# Patient Record
Sex: Female | Born: 1965 | Race: White | Hispanic: No | Marital: Single | Smoking: Current every day smoker
Health system: Southern US, Community
[De-identification: ages and names within clinical notes are randomized; demographics above are authoritative.]

## PROBLEM LIST (undated history)

## (undated) DIAGNOSIS — N760 Acute vaginitis: Secondary | ICD-10-CM

## (undated) DIAGNOSIS — N281 Cyst of kidney, acquired: Secondary | ICD-10-CM

## (undated) DIAGNOSIS — M545 Low back pain, unspecified: Secondary | ICD-10-CM

## (undated) DIAGNOSIS — K219 Gastro-esophageal reflux disease without esophagitis: Secondary | ICD-10-CM

## (undated) DIAGNOSIS — F419 Anxiety disorder, unspecified: Secondary | ICD-10-CM

## (undated) DIAGNOSIS — R0981 Nasal congestion: Secondary | ICD-10-CM

## (undated) DIAGNOSIS — R1030 Lower abdominal pain, unspecified: Secondary | ICD-10-CM

## (undated) DIAGNOSIS — N200 Calculus of kidney: Secondary | ICD-10-CM

## (undated) DIAGNOSIS — B373 Candidiasis of vulva and vagina: Secondary | ICD-10-CM

## (undated) DIAGNOSIS — F32A Depression, unspecified: Secondary | ICD-10-CM

## (undated) DIAGNOSIS — K648 Other hemorrhoids: Secondary | ICD-10-CM

## (undated) DIAGNOSIS — B3731 Acute candidiasis of vulva and vagina: Secondary | ICD-10-CM

## (undated) DIAGNOSIS — Z9889 Other specified postprocedural states: Secondary | ICD-10-CM

## (undated) DIAGNOSIS — IMO0001 Reserved for inherently not codable concepts without codable children: Secondary | ICD-10-CM

## (undated) DIAGNOSIS — R112 Nausea with vomiting, unspecified: Secondary | ICD-10-CM

## (undated) DIAGNOSIS — F329 Major depressive disorder, single episode, unspecified: Secondary | ICD-10-CM

## (undated) DIAGNOSIS — R310 Gross hematuria: Secondary | ICD-10-CM

## (undated) HISTORY — PX: NASAL SINUS SURGERY: SHX719

## (undated) HISTORY — DX: Other hemorrhoids: K64.8

## (undated) HISTORY — DX: Gross hematuria: R31.0

## (undated) HISTORY — DX: Acute candidiasis of vulva and vagina: B37.31

## (undated) HISTORY — DX: Candidiasis of vulva and vagina: B37.3

## (undated) HISTORY — DX: Calculus of kidney: N20.0

## (undated) HISTORY — PX: TUBAL LIGATION: SHX77

## (undated) HISTORY — DX: Nasal congestion: R09.81

## (undated) HISTORY — DX: Cyst of kidney, acquired: N28.1

## (undated) HISTORY — DX: Major depressive disorder, single episode, unspecified: F32.9

## (undated) HISTORY — DX: Acute vaginitis: N76.0

## (undated) HISTORY — DX: Gastro-esophageal reflux disease without esophagitis: K21.9

## (undated) HISTORY — DX: Depression, unspecified: F32.A

## (undated) HISTORY — DX: Low back pain: M54.5

## (undated) HISTORY — DX: Low back pain, unspecified: M54.50

## (undated) HISTORY — PX: ABDOMINAL HYSTERECTOMY: SHX81

## (undated) HISTORY — DX: Lower abdominal pain, unspecified: R10.30

## (undated) HISTORY — DX: Anxiety disorder, unspecified: F41.9

---

## 2003-10-17 ENCOUNTER — Ambulatory Visit: Payer: Self-pay | Admitting: Obstetrics and Gynecology

## 2005-09-02 ENCOUNTER — Ambulatory Visit: Payer: Self-pay | Admitting: Infectious Diseases

## 2005-11-23 ENCOUNTER — Ambulatory Visit: Payer: Self-pay | Admitting: Gastroenterology

## 2005-11-29 ENCOUNTER — Ambulatory Visit: Payer: Self-pay | Admitting: Gastroenterology

## 2006-11-08 ENCOUNTER — Ambulatory Visit: Payer: Self-pay | Admitting: Gastroenterology

## 2006-12-26 ENCOUNTER — Ambulatory Visit: Payer: Self-pay | Admitting: Unknown Physician Specialty

## 2008-01-29 ENCOUNTER — Ambulatory Visit: Payer: Self-pay | Admitting: Unknown Physician Specialty

## 2008-02-08 ENCOUNTER — Ambulatory Visit: Payer: Self-pay | Admitting: Unknown Physician Specialty

## 2009-10-05 ENCOUNTER — Encounter (INDEPENDENT_AMBULATORY_CARE_PROVIDER_SITE_OTHER): Payer: Self-pay | Admitting: *Deleted

## 2010-02-03 NOTE — Assessment & Plan Note (Signed)
Summary: SINUS INFECTION/EVM    Plan Planning Comments:   Patient left due to insurance issues.   The patient and/or caregiver has been counseled thoroughly with regard to medications prescribed including dosage, schedule, interactions, rationale for use, and possible side effects and they verbalize understanding.  Diagnoses and expected course of recovery discussed and will return if not improved as expected or if the condition worsens. Patient and/or caregiver verbalized understanding.

## 2012-01-22 ENCOUNTER — Ambulatory Visit: Payer: Self-pay | Admitting: Unknown Physician Specialty

## 2012-01-26 LAB — EXPECTORATED SPUTUM ASSESSMENT W GRAM STAIN, RFLX TO RESP C

## 2014-02-20 DIAGNOSIS — F17210 Nicotine dependence, cigarettes, uncomplicated: Secondary | ICD-10-CM | POA: Insufficient documentation

## 2014-03-14 ENCOUNTER — Emergency Department: Payer: Self-pay | Admitting: Emergency Medicine

## 2014-09-25 DIAGNOSIS — F32A Depression, unspecified: Secondary | ICD-10-CM | POA: Insufficient documentation

## 2014-09-25 DIAGNOSIS — F419 Anxiety disorder, unspecified: Secondary | ICD-10-CM | POA: Insufficient documentation

## 2014-09-25 DIAGNOSIS — M5440 Lumbago with sciatica, unspecified side: Secondary | ICD-10-CM | POA: Insufficient documentation

## 2014-09-25 DIAGNOSIS — B373 Candidiasis of vulva and vagina: Secondary | ICD-10-CM | POA: Insufficient documentation

## 2014-09-25 DIAGNOSIS — F329 Major depressive disorder, single episode, unspecified: Secondary | ICD-10-CM | POA: Insufficient documentation

## 2014-09-25 DIAGNOSIS — B3731 Acute candidiasis of vulva and vagina: Secondary | ICD-10-CM | POA: Insufficient documentation

## 2014-09-25 DIAGNOSIS — K648 Other hemorrhoids: Secondary | ICD-10-CM | POA: Insufficient documentation

## 2014-10-01 DIAGNOSIS — N760 Acute vaginitis: Secondary | ICD-10-CM | POA: Insufficient documentation

## 2014-10-25 ENCOUNTER — Other Ambulatory Visit: Payer: Self-pay | Admitting: Internal Medicine

## 2014-10-25 DIAGNOSIS — R310 Gross hematuria: Secondary | ICD-10-CM | POA: Insufficient documentation

## 2014-10-27 DIAGNOSIS — R319 Hematuria, unspecified: Secondary | ICD-10-CM | POA: Insufficient documentation

## 2014-10-31 ENCOUNTER — Ambulatory Visit
Admission: RE | Admit: 2014-10-31 | Discharge: 2014-10-31 | Disposition: A | Discharge status: Home / Self Care | Payer: Managed Care, Other (non HMO) | Source: Ambulatory Visit | Attending: Internal Medicine | Admitting: Internal Medicine

## 2014-10-31 DIAGNOSIS — N2889 Other specified disorders of kidney and ureter: Secondary | ICD-10-CM | POA: Insufficient documentation

## 2014-10-31 DIAGNOSIS — R319 Hematuria, unspecified: Secondary | ICD-10-CM | POA: Diagnosis not present

## 2014-10-31 DIAGNOSIS — R310 Gross hematuria: Secondary | ICD-10-CM

## 2014-11-05 ENCOUNTER — Ambulatory Visit (INDEPENDENT_AMBULATORY_CARE_PROVIDER_SITE_OTHER): Payer: Managed Care, Other (non HMO) | Admitting: Urology

## 2014-11-05 ENCOUNTER — Encounter: Payer: Self-pay | Admitting: Urology

## 2014-11-05 ENCOUNTER — Encounter: Payer: Self-pay | Admitting: *Deleted

## 2014-11-05 VITALS — BP 109/70 | HR 78 | Temp 98.0°F | Resp 16 | Ht 68.0 in | Wt 191.0 lb

## 2014-11-05 DIAGNOSIS — R310 Gross hematuria: Secondary | ICD-10-CM | POA: Diagnosis not present

## 2014-11-05 LAB — MICROSCOPIC EXAMINATION
RBC, UA: >30 /hpf — AB (ref 0–?)
Renal Epithel, UA: NONE SEEN /hpf
WBC, UA: NONE SEEN /hpf (ref 0–?)

## 2014-11-05 LAB — URINALYSIS, COMPLETE
Bilirubin, UA: NEGATIVE
Glucose, UA: NEGATIVE
Ketones, UA: NEGATIVE
LEUKOCYTES UA: NEGATIVE
Nitrite, UA: NEGATIVE
PROTEIN UA: NEGATIVE
Specific Gravity, UA: >1.03 — ABNORMAL HIGH (ref 1.005–1.030)
Urobilinogen, Ur: 0.2 mg/dL (ref 0.2–1.0)
pH, UA: 6 (ref 5.0–7.5)

## 2014-11-05 NOTE — Progress Notes (Signed)
11/05/2014 11:19 AM   Brianna Huff 1965/08/10 [id number]  Referring provider: No referring provider defined for this encounter.  Chief Complaint  Patient presents with  . Hematuria  . Establish Care    HPI: Patient is a 49 year old white female with an episode of gross hematuria who is referred by her primary care physician for further evaluation and management.  Patient states she was on Augmentin for bronchitis and had an episode of gross hematuria during her antibiotic course. She has no other urinary symptoms at this time. She specifically denies any dysuria, suprapubic pain, history of stones, history of infections or personal history of GU malignancies.    She does have a 34-pack-year history of smoking and works as a Retail buyer.  She is having some left sided flank pain.  There is no family history of GU malignancies or stones.       PMH: Past Medical History  Diagnosis Date  . GERD (gastroesophageal reflux disease)   . Anxiety and depression   . Gross hematuria   . Vaginitis   . Monilial vaginitis   . Low back pain   . Internal hemorrhoids   . Nasal congestion     Surgical History: Past Surgical History  Procedure Laterality Date  . Nasal sinus surgery    . Tubal ligation    . Abdominal hysterectomy      still has ovaries     Home Medications:    Medication List       This list is accurate as of: 11/05/14 11:59 PM.  Always use your most recent med list.               citalopram 10 MG tablet  Commonly known as:  CELEXA  Take 10 mg by mouth daily.     meloxicam 7.5 MG tablet  Commonly known as:  MOBIC  Take 7.5 mg by mouth daily.     omeprazole 20 MG capsule  Commonly known as:  PRILOSEC  Take 20 mg by mouth daily.     PROBIOTIC DAILY PO  Take by mouth.        Allergies:  Allergies  Allergen Reactions  . 2,4-D Dimethylamine (Amisol) Shortness Of Breath  . Sulfa Antibiotics Itching and Shortness Of Breath  . Penicillin G    Other reaction(s): Unknown    Family History: Family History  Problem Relation Age of Onset  . Prostate cancer Neg Hx   . Kidney cancer Neg Hx     Social History:  reports that she has been smoking Cigarettes.  She has a 35 pack-year smoking history. She does not have any smokeless tobacco history on file. She reports that she does not drink alcohol or use illicit drugs.  ROS: UROLOGY Frequent Urination?: No Hard to postpone urination?: No Burning/pain with urination?: No Get up at night to urinate?: No Leakage of urine?: No Urine stream starts and stops?: No Trouble starting stream?: No Do you have to strain to urinate?: No Blood in urine?: Yes Urinary tract infection?: No Sexually transmitted disease?: No Injury to kidneys or bladder?: No Painful intercourse?: No Weak stream?: No Currently pregnant?: No Vaginal bleeding?: No Last menstrual period?: n  Gastrointestinal Nausea?: No Vomiting?: No Indigestion/heartburn?: Yes Diarrhea?: No Constipation?: No  Constitutional Fever: No Night sweats?: No Weight loss?: No Fatigue?: No  Skin Skin rash/lesions?: No Itching?: No  Eyes Blurred vision?: No Double vision?: No  Ears/Nose/Throat Sore throat?: No Sinus problems?: Yes  Hematologic/Lymphatic Swollen glands?: No  Easy bruising?: Yes  Cardiovascular Leg swelling?: No Chest pain?: No  Respiratory Cough?: Yes Shortness of breath?: No  Endocrine Excessive thirst?: No  Musculoskeletal Back pain?: Yes Joint pain?: No  Neurological Headaches?: No Dizziness?: No  Psychologic Depression?: No Anxiety?: No  Physical Exam: BP 109/70 mmHg  Pulse 78  Temp(Src) 98 F (36.7 C)  Resp 16  Ht 5\' 8"  (1.727 m)  Wt 191 lb (86.637 kg)  BMI 29.05 kg/m2  SpO2 98%  Constitutional: Well nourished. Alert and oriented, No acute distress. HEENT: [place] AT, moist mucus membranes. Trachea midline, no masses. Cardiovascular: No clubbing, cyanosis, or  edema. Respiratory: Normal respiratory effort, no increased work of breathing. GI: Abdomen is soft, non tender, non distended, no abdominal masses. Liver and spleen not palpable.  No hernias appreciated.  Stool sample for occult testing is not indicated.   GU: No CVA tenderness.  No bladder fullness or masses.   Skin: No rashes, bruises or suspicious lesions. Lymph: No cervical or inguinal adenopathy. Neurologic: Grossly intact, no focal deficits, moving all 4 extremities. Psychiatric: Normal mood and affect.  Laboratory Data:  Urinalysis Results for orders placed or performed in visit on 11/05/14  Microscopic Examination  Result Value Ref Range   WBC, UA None seen 0 -  5 /hpf   RBC, UA >30 (A) 0 -  2 /hpf   Epithelial Cells (non renal) 0-10 0 - 10 /hpf   Renal Epithel, UA None seen None seen /hpf   Bacteria, UA Few (A) None seen/Few  Urinalysis, Complete  Result Value Ref Range   Specific Gravity, UA >1.030 (H) 1.005 - 1.030   pH, UA 6.0 5.0 - 7.5   Color, UA Yellow Yellow   Appearance Ur Clear Clear   Leukocytes, UA Negative Negative   Protein, UA Negative Negative/Trace   Glucose, UA Negative Negative   Ketones, UA Negative Negative   RBC, UA 3+ (A) Negative   Bilirubin, UA Negative Negative   Urobilinogen, Ur 0.2 0.2 - 1.0 mg/dL   Nitrite, UA Negative Negative   Microscopic Examination See below:     1. Gross hematuria:   Explained to patient the causes of blood in the urine are as follows: stones, UTI's, damage to the urinary tract and/or cancer.  It is explained to the patient that they will be scheduled for a CT Urogram with contrast material and that in rare instances, an allergic reaction can be serious and even life threatening with the injection of contrast material.   The patient denies any allergies to contrast, iodine and/or seafood and is not taking metformin.  - Urinalysis, Complete   Return for CT Urogram.  Zara Council, [place] [address] [place], [place] [address] [phone]

## 2014-11-06 ENCOUNTER — Encounter: Payer: Self-pay | Admitting: Urology

## 2014-11-06 ENCOUNTER — Telehealth: Payer: Self-pay | Admitting: Radiology

## 2014-11-06 NOTE — Telephone Encounter (Signed)
Received request to have pt's CT Abdomen Pelvis WWO order sent to [facility] # [phone]. Per Zara Council, when pt goes to another location she is unable to see the films. Our equipment doesn't allow [place] to view CDs from another facility. LMOM for pt to return call.

## 2014-11-07 NOTE — Telephone Encounter (Signed)
Pt instructed to call insurance company to have location of CT scan changed to [facility] so films could be viewed. Pt did this & CT was scheduled at the outpatient imaging center on [address].

## 2014-11-11 ENCOUNTER — Emergency Department
Admission: EM | Admit: 2014-11-11 | Discharge: 2014-11-11 | Disposition: A | Discharge status: Home / Self Care | Payer: Managed Care, Other (non HMO) | Attending: Emergency Medicine | Admitting: Emergency Medicine

## 2014-11-11 ENCOUNTER — Emergency Department: Payer: Managed Care, Other (non HMO)

## 2014-11-11 DIAGNOSIS — N23 Unspecified renal colic: Secondary | ICD-10-CM | POA: Insufficient documentation

## 2014-11-11 DIAGNOSIS — Z880 Allergy status to penicillin: Secondary | ICD-10-CM | POA: Diagnosis not present

## 2014-11-11 DIAGNOSIS — Z79899 Other long term (current) drug therapy: Secondary | ICD-10-CM | POA: Insufficient documentation

## 2014-11-11 DIAGNOSIS — Z791 Long term (current) use of non-steroidal anti-inflammatories (NSAID): Secondary | ICD-10-CM | POA: Insufficient documentation

## 2014-11-11 DIAGNOSIS — N200 Calculus of kidney: Secondary | ICD-10-CM | POA: Insufficient documentation

## 2014-11-11 DIAGNOSIS — R109 Unspecified abdominal pain: Secondary | ICD-10-CM | POA: Diagnosis present

## 2014-11-11 LAB — URINALYSIS COMPLETE WITH MICROSCOPIC (ARMC ONLY)
BILIRUBIN URINE: NEGATIVE
GLUCOSE, UA: NEGATIVE mg/dL
KETONES UR: NEGATIVE mg/dL
Leukocytes, UA: NEGATIVE
NITRITE: NEGATIVE
Protein, ur: 30 mg/dL — AB
SPECIFIC GRAVITY, URINE: 1.019 (ref 1.005–1.030)
pH: 8 (ref 5.0–8.0)

## 2014-11-11 LAB — BASIC METABOLIC PANEL
ANION GAP: 14 (ref 5–15)
BUN: 18 mg/dL (ref 6–20)
CALCIUM: 9.5 mg/dL (ref 8.9–10.3)
CO2: 22 mmol/L (ref 22–32)
Chloride: 103 mmol/L (ref 101–111)
Creatinine, Ser: 0.75 mg/dL (ref 0.44–1.00)
GFR calc Af Amer: >60 mL/min (ref >60)
GLUCOSE: 142 mg/dL — AB (ref 65–99)
Potassium: 3.4 mmol/L — ABNORMAL LOW (ref 3.5–5.1)
Sodium: 139 mmol/L (ref 135–145)

## 2014-11-11 LAB — CBC
HCT: 43 % (ref 35.0–47.0)
HEMOGLOBIN: 14.3 g/dL (ref 12.0–16.0)
MCH: 30.6 pg (ref 26.0–34.0)
MCHC: 33.3 g/dL (ref 32.0–36.0)
MCV: 91.8 fL (ref 80.0–100.0)
PLATELETS: 320 10*3/uL (ref 150–440)
RBC: 4.69 MIL/uL (ref 3.80–5.20)
RDW: 14.7 % — AB (ref 11.5–14.5)
WBC: 11.5 10*3/uL — ABNORMAL HIGH (ref 3.6–11.0)

## 2014-11-11 MED ORDER — OXYCODONE-ACETAMINOPHEN 5-325 MG PO TABS
2.0000 | ORAL_TABLET | Freq: Once | ORAL | Status: AC
Start: 2014-11-11 — End: 2014-11-11
  Administered 2014-11-11: 2 via ORAL
  Filled 2014-11-11: qty 2

## 2014-11-11 MED ORDER — ONDANSETRON 4 MG PO TBDP: 4.0000 mg | ORAL_TABLET | Freq: Three times a day (TID) | ORAL | Status: DC | PRN

## 2014-11-11 MED ORDER — KETOROLAC TROMETHAMINE 30 MG/ML IJ SOLN
30.0000 mg | Freq: Once | INTRAMUSCULAR | Status: AC
  Administered 2014-11-11: 30 mg via INTRAVENOUS
  Filled 2014-11-11: qty 1

## 2014-11-11 MED ORDER — OXYCODONE-ACETAMINOPHEN 5-325 MG PO TABS: 1.0000 | ORAL_TABLET | ORAL | Status: DC | PRN

## 2014-11-11 MED ORDER — ONDANSETRON HCL 4 MG/2ML IJ SOLN
4.0000 mg | Freq: Once | INTRAMUSCULAR | Status: AC
  Administered 2014-11-11: 4 mg via INTRAVENOUS

## 2014-11-11 MED ORDER — SODIUM CHLORIDE 0.9 % IV SOLN
1000.0000 mL | Freq: Once | INTRAVENOUS | Status: AC
  Administered 2014-11-11: 1000 mL via INTRAVENOUS
  Filled 2014-11-11: qty 1000

## 2014-11-11 MED ORDER — HYDROMORPHONE HCL 1 MG/ML IJ SOLN
1.0000 mg | INTRAMUSCULAR | Status: AC
  Administered 2014-11-11: 1 mg via INTRAVENOUS

## 2014-11-11 MED ORDER — ONDANSETRON HCL 4 MG/2ML IJ SOLN
INTRAMUSCULAR | Status: AC
  Administered 2014-11-11: 4 mg via INTRAVENOUS
  Filled 2014-11-11: qty 2

## 2014-11-11 MED ORDER — CIPROFLOXACIN HCL 500 MG PO TABS
500.0000 mg | ORAL_TABLET | Freq: Once | ORAL | Status: AC
  Administered 2014-11-11: 500 mg via ORAL
  Filled 2014-11-11: qty 1

## 2014-11-11 MED ORDER — METOCLOPRAMIDE HCL 5 MG/ML IJ SOLN
10.0000 mg | Freq: Once | INTRAMUSCULAR | Status: AC
  Administered 2014-11-11: 10 mg via INTRAVENOUS
  Filled 2014-11-11: qty 2

## 2014-11-11 MED ORDER — HYDROMORPHONE HCL 1 MG/ML IJ SOLN
INTRAMUSCULAR | Status: AC
  Administered 2014-11-11: 0.5 mg via INTRAVENOUS
  Filled 2014-11-11: qty 1

## 2014-11-11 MED ORDER — HYDROMORPHONE HCL 1 MG/ML IJ SOLN
0.5000 mg | INTRAMUSCULAR | Status: AC
  Administered 2014-11-11: 0.5 mg via INTRAVENOUS

## 2014-11-11 MED ORDER — CIPROFLOXACIN HCL 500 MG PO TABS
500.0000 mg | ORAL_TABLET | Freq: Two times a day (BID) | ORAL | Status: DC
Start: 2014-11-11 — End: 2014-12-25

## 2014-11-11 MED ORDER — HYDROMORPHONE HCL 1 MG/ML IJ SOLN
INTRAMUSCULAR | Status: AC
  Administered 2014-11-11: 1 mg via INTRAVENOUS
  Filled 2014-11-11: qty 1

## 2014-11-11 MED ORDER — KETOROLAC TROMETHAMINE 30 MG/ML IJ SOLN: 30.0000 mg | Freq: Once | INTRAMUSCULAR | Status: DC

## 2014-11-11 NOTE — Discharge Instructions (Signed)
You have a 4 mm x 6 mm stone in the left ureter, just at the end before into the bladder. You will likely pass this on your own. Take Percocet as needed for pain control. Follow-up with [facility] urology. Return to the emergency department if you have uncontrolled pain, ongoing nausea vomiting, or other urgent concerns.  Kidney Stones Kidney stones (urolithiasis) are deposits that form inside your kidneys. The intense pain is caused by the stone moving through the urinary tract. When the stone moves, the ureter goes into spasm around the stone. The stone is usually passed in the urine.  CAUSES   A disorder that makes certain neck glands produce too much parathyroid hormone (primary hyperparathyroidism).  A buildup of uric acid crystals, similar to gout in your joints.  Narrowing (stricture) of the ureter.  A kidney obstruction present at birth (congenital obstruction).  Previous surgery on the kidney or ureters.  Numerous kidney infections. SYMPTOMS   Feeling sick to your stomach (nauseous).  Throwing up (vomiting).  Blood in the urine (hematuria).  Pain that usually spreads (radiates) to the groin.  Frequency or urgency of urination. DIAGNOSIS   Taking a history and physical exam.  Blood or urine tests.  CT scan.  Occasionally, an examination of the inside of the urinary bladder (cystoscopy) is performed. TREATMENT   Observation.  Increasing your fluid intake.  Extracorporeal shock wave lithotripsy--This is a noninvasive procedure that uses shock waves to break up kidney stones.  Surgery may be needed if you have severe pain or persistent obstruction. There are various surgical procedures. Most of the procedures are performed with the use of small instruments. Only small incisions are needed to accommodate these instruments, so recovery time is minimized. The size, location, and chemical composition are all important variables that will determine the proper choice of  action for you. Talk to your health care provider to better understand your situation so that you will minimize the risk of injury to yourself and your kidney.  HOME CARE INSTRUCTIONS   Drink enough water and fluids to keep your urine clear or pale yellow. This will help you to pass the stone or stone fragments.  Strain all urine through the provided strainer. Keep all particulate matter and stones for your health care provider to see. The stone causing the pain may be as small as a grain of salt. It is very important to use the strainer each and every time you pass your urine. The collection of your stone will allow your health care provider to analyze it and verify that a stone has actually passed. The stone analysis will often identify what you can do to reduce the incidence of recurrences.  Only take over-the-counter or prescription medicines for pain, discomfort, or fever as directed by your health care provider.  Keep all follow-up visits as told by your health care provider. This is important.  Get follow-up X-rays if required. The absence of pain does not always mean that the stone has passed. It may have only stopped moving. If the urine remains completely obstructed, it can cause loss of kidney function or even complete destruction of the kidney. It is your responsibility to make sure X-rays and follow-ups are completed. Ultrasounds of the kidney can show blockages and the status of the kidney. Ultrasounds are not associated with any radiation and can be performed easily in a matter of minutes.  Make changes to your daily diet as told by your health care provider. You may be  told to:  Limit the amount of salt that you eat.  Eat 5 or more servings of fruits and vegetables each day.  Limit the amount of meat, poultry, fish, and eggs that you eat.  Collect a 24-hour urine sample as told by your health care provider.You may need to collect another urine sample every 6-12 months. SEEK  MEDICAL CARE IF:  You experience pain that is progressive and unresponsive to any pain medicine you have been prescribed. SEEK IMMEDIATE MEDICAL CARE IF:   Pain cannot be controlled with the prescribed medicine.  You have a fever or shaking chills.  The severity or intensity of pain increases over 18 hours and is not relieved by pain medicine.  You develop a new onset of abdominal pain.  You feel faint or pass out.  You are unable to urinate.   This information is not intended to replace advice given to you by your health care provider. Make sure you discuss any questions you have with your health care provider.   Document Released: 12/21/2004 Document Revised: 09/11/2014 Document Reviewed: 05/24/2012 Elsevier Interactive Patient Education [employer].

## 2014-11-11 NOTE — ED Provider Notes (Signed)
[facility] Emergency Department Provider Note  ____________________________________________  Time seen: 1950   I have reviewed the triage vital signs and the nursing notes.   HISTORY  Chief Complaint Flank Pain  nausea vomiting    HPI Brianna Huff is a 49 y.o. female presents to the emergency department with left flank pain and repetitive and forceful emesis. The patient is in moderate to severe distress.  The patient has had symptoms for a few days. She had an ultrasound the other day which did not show any stone or hydronephrosis. She has a CT scan ordered for tomorrow morning due to her ongoing symptoms.    Past Medical History  Diagnosis Date  . GERD (gastroesophageal reflux disease)   . Anxiety and depression   . Gross hematuria   . Vaginitis   . Monilial vaginitis   . Low back pain   . Internal hemorrhoids   . Nasal congestion   . Lower abdominal pain     Patient Active Problem List   Diagnosis Date Noted  . Gross hematuria 11/05/2014  . Blood in the urine 10/27/2014  . Frank hematuria 10/25/2014  . Acute vaginitis 10/01/2014  . Acute back pain with sciatica 09/25/2014  . Hemorrhoids, internal 09/25/2014  . Anxiety and depression 09/25/2014  . Candida vaginitis 09/25/2014  . Cigarette smoker 02/20/2014    Past Surgical History  Procedure Laterality Date  . Nasal sinus surgery    . Tubal ligation    . Abdominal hysterectomy      still has ovaries     Current Outpatient Rx  Name  Route  Sig  Dispense  Refill  . citalopram (CELEXA) 10 MG tablet   Oral   Take 10 mg by mouth daily.         . meloxicam (MOBIC) 7.5 MG tablet   Oral   Take 7.5 mg by mouth daily.         Marland Kitchen omeprazole (PRILOSEC) 40 MG capsule   Oral   Take 40 mg by mouth daily.         . Probiotic Product (PROBIOTIC DAILY PO)   Oral   Take by mouth.         . ondansetron (ZOFRAN ODT) 4 MG disintegrating tablet   Oral   Take 1 tablet (4 mg  total) by mouth every 8 (eight) hours as needed for nausea or vomiting.   10 tablet   0   . oxyCODONE-acetaminophen (PERCOCET/ROXICET) 5-325 MG tablet   Oral   Take 1 tablet by mouth every 4 (four) hours as needed for severe pain.   20 tablet   0     Allergies 2,4-d dimethylamine (amisol); Sulfa antibiotics; and Penicillin g  Family History  Problem Relation Age of Onset  . Prostate cancer Neg Hx   . Kidney cancer Neg Hx     Social History Social History  Substance Use Topics  . Smoking status: Current Every Day Smoker -- 1.00 packs/day for 35 years    Types: Cigarettes  . Smokeless tobacco: Not on file  . Alcohol Use: No    Review of Systems  Constitutional: Negative for fatigue. ENT: Negative for congestion. Cardiovascular: Negative for chest pain. Respiratory: Negative for cough. Gastrointestinal: Left flank pain. Nausea and vomiting. Genitourinary: Left flank pain. See history of present illness. Musculoskeletal: No myalgias or injuries. Skin: Negative for rash. Neurological: Negative for headache or focal weakness   10-point ROS otherwise negative.  ____________________________________________   PHYSICAL  EXAM:  VITAL SIGNS: ED Triage Vitals  Enc Vitals Group     BP 11/11/14 1926 117/93 mmHg     Pulse Rate 11/11/14 1926 76     Resp 11/11/14 1926 24     Temp 11/11/14 1926 97.9 F (36.6 C)     Temp Source 11/11/14 1926 Oral     SpO2 11/11/14 1926 100 %     Weight 11/11/14 1926 191 lb (86.637 kg)     Height 11/11/14 1926 5\' 8"  (1.727 m)     Head Cir --      Peak Flow --      Pain Score 11/11/14 1925 10     Pain Loc --      Pain Edu? --      Excl. in [place]? --     Constitutional:  Alert, but distressed with active emesis. ENT   Head: Normocephalic and atraumatic. Cardiovascular: Normal rate, regular rhythm, no murmur noted Respiratory:  Normal respiratory effort, no tachypnea.    Breath sounds are clear and equal bilaterally.   Gastrointestinal: Soft, with some tenderness in the left abdomen..  Back: Positive CVA tenderness in the left.. Musculoskeletal: No deformity noted. Nontender with normal range of motion in all extremities.  No noted edema. Neurologic:  Communicative. Normal appearing spontaneous movement in all 4 extremities. No gross focal neurologic deficits are appreciated.  Skin:  Skin is moist, patient with active emesis. Psychiatric: Patient in distress due to active and ongoing nausea and vomiting. Otherwise, appropriate with intact admission.  ____________________________________________    LABS (pertinent positives/negatives)  Labs Reviewed  BASIC METABOLIC PANEL - Abnormal; Notable for the following:    Potassium 3.4 (*)    Glucose, Bld 142 (*)    All other components within normal limits  CBC - Abnormal; Notable for the following:    WBC 11.5 (*)    RDW 14.7 (*)    All other components within normal limits  URINALYSIS COMPLETEWITH MICROSCOPIC ([facility] ONLY) - Abnormal; Notable for the following:    Color, Urine YELLOW (*)    APPearance CLOUDY (*)    Hgb urine dipstick 2+ (*)    Protein, ur 30 (*)    Bacteria, UA MANY (*)    Squamous Epithelial / LPF 6-30 (*)    All other components within normal limits     ____________________________________________   RADIOLOGY  CT abdomen and pelvis: IMPRESSION: 1. Obstructing 4 x 6 mm stone at the left ureterovesicular junction with moderate resultant hydroureteronephrosis and perinephric stranding. 2. Additional nonobstructing stones in both kidneys, left greater than right. 3. Diffuse diverticulosis without diverticulitis.   ____________________________________________   INITIAL IMPRESSION / ASSESSMENT AND PLAN / ED COURSE  Pertinent labs & imaging results that were available during my care of the patient were reviewed by me and considered in my medical decision making (see chart for details).  49 year old female in significant  distress on arrival. She has recurrent emesis and left flank pain. Urinalysis shows red blood cells too numerous to count. She's been treated with Dilaudid 1 mg and Zofran. Nausea and vomiting continued. We gave her 10 mg of Reglan and then a little more Dilaudid. After this she looks significantly more comfortable and had her CT scan.  The CT scan does show an obstructing stone at the left UVJ, 4 mm x 6 mm. She has notable hydronephrosis with perinephric stranding.  Given the distal location, I suspect this stone should pass on its own. We will treat her with  Toradol for additional control of pain and, due to the perinephric stranding, give her a dose of Cipro.  ----------------------------------------- 9:44 PM on 11/11/2014 -----------------------------------------  Patient looks significantly better then when she first arrived. She is much more calm and communicative in no acute distress. She does report that the pain is coming back some. We are treating her with a dose of Toradol. Given the distal location of the stone, I suspect she will pass this on her own.  We will also treated with a Percocet now as well.   ____________________________________________   FINAL CLINICAL IMPRESSION(S) / ED DIAGNOSES  Final diagnoses:  Renal colic on left side  Kidney stone      Ahmed Prima, MD 11/11/14 2319

## 2014-11-11 NOTE — ED Notes (Signed)
Pt to room 4 via w/c actively dry heaving, very restless, incont urine; reports left flank and lower abd pain with nausea and hematuria; denies hx of same

## 2014-11-11 NOTE — ED Notes (Signed)
Pt in NAD.  Pain decreased.  Pt understands discharge instructions.  Family with patient upon discharge.  Urine strainer provided to patient and instructed on how to use.  No other questions or concerns at this time.

## 2014-11-12 ENCOUNTER — Ambulatory Visit: Admission: RE | Admit: 2014-11-12 | Payer: Managed Care, Other (non HMO) | Source: Ambulatory Visit

## 2014-11-13 ENCOUNTER — Emergency Department
Admission: EM | Admit: 2014-11-13 | Discharge: 2014-11-13 | Disposition: A | Discharge status: Home / Self Care | Payer: Managed Care, Other (non HMO) | Attending: Emergency Medicine | Admitting: Emergency Medicine

## 2014-11-13 ENCOUNTER — Emergency Department: Payer: Managed Care, Other (non HMO) | Admitting: Anesthesiology

## 2014-11-13 ENCOUNTER — Encounter
Admission: EM | Disposition: A | Discharge status: Home / Self Care | Payer: Self-pay | Source: Home / Self Care | Attending: Emergency Medicine

## 2014-11-13 ENCOUNTER — Encounter: Payer: Self-pay | Admitting: *Deleted

## 2014-11-13 DIAGNOSIS — N200 Calculus of kidney: Secondary | ICD-10-CM

## 2014-11-13 DIAGNOSIS — M549 Dorsalgia, unspecified: Secondary | ICD-10-CM | POA: Diagnosis not present

## 2014-11-13 DIAGNOSIS — R1032 Left lower quadrant pain: Secondary | ICD-10-CM | POA: Diagnosis not present

## 2014-11-13 DIAGNOSIS — R310 Gross hematuria: Secondary | ICD-10-CM | POA: Insufficient documentation

## 2014-11-13 DIAGNOSIS — Z888 Allergy status to other drugs, medicaments and biological substances status: Secondary | ICD-10-CM | POA: Diagnosis not present

## 2014-11-13 DIAGNOSIS — Z79899 Other long term (current) drug therapy: Secondary | ICD-10-CM | POA: Diagnosis not present

## 2014-11-13 DIAGNOSIS — N132 Hydronephrosis with renal and ureteral calculous obstruction: Secondary | ICD-10-CM | POA: Insufficient documentation

## 2014-11-13 DIAGNOSIS — R109 Unspecified abdominal pain: Secondary | ICD-10-CM | POA: Diagnosis not present

## 2014-11-13 DIAGNOSIS — R112 Nausea with vomiting, unspecified: Secondary | ICD-10-CM | POA: Diagnosis not present

## 2014-11-13 DIAGNOSIS — K219 Gastro-esophageal reflux disease without esophagitis: Secondary | ICD-10-CM | POA: Diagnosis not present

## 2014-11-13 DIAGNOSIS — F419 Anxiety disorder, unspecified: Secondary | ICD-10-CM | POA: Insufficient documentation

## 2014-11-13 DIAGNOSIS — B373 Candidiasis of vulva and vagina: Secondary | ICD-10-CM | POA: Diagnosis not present

## 2014-11-13 DIAGNOSIS — N760 Acute vaginitis: Secondary | ICD-10-CM | POA: Insufficient documentation

## 2014-11-13 DIAGNOSIS — M5432 Sciatica, left side: Secondary | ICD-10-CM | POA: Insufficient documentation

## 2014-11-13 DIAGNOSIS — Z90710 Acquired absence of both cervix and uterus: Secondary | ICD-10-CM | POA: Diagnosis not present

## 2014-11-13 DIAGNOSIS — Z882 Allergy status to sulfonamides status: Secondary | ICD-10-CM | POA: Diagnosis not present

## 2014-11-13 DIAGNOSIS — F329 Major depressive disorder, single episode, unspecified: Secondary | ICD-10-CM | POA: Insufficient documentation

## 2014-11-13 DIAGNOSIS — Z880 Allergy status to penicillin: Secondary | ICD-10-CM | POA: Insufficient documentation

## 2014-11-13 DIAGNOSIS — F17210 Nicotine dependence, cigarettes, uncomplicated: Secondary | ICD-10-CM | POA: Diagnosis not present

## 2014-11-13 DIAGNOSIS — N201 Calculus of ureter: Secondary | ICD-10-CM | POA: Diagnosis not present

## 2014-11-13 HISTORY — PX: CYSTOSCOPY WITH STENT PLACEMENT: SHX5790

## 2014-11-13 LAB — URINALYSIS COMPLETE WITH MICROSCOPIC (ARMC ONLY)
BILIRUBIN URINE: NEGATIVE
Bacteria, UA: NONE SEEN
Glucose, UA: 50 mg/dL — AB
LEUKOCYTES UA: NEGATIVE
NITRITE: NEGATIVE
PH: 7 (ref 5.0–8.0)
Protein, ur: NEGATIVE mg/dL
Specific Gravity, Urine: 1.015 (ref 1.005–1.030)

## 2014-11-13 LAB — CBC
HCT: 39.5 % (ref 35.0–47.0)
Hemoglobin: 13.2 g/dL (ref 12.0–16.0)
MCH: 30.6 pg (ref 26.0–34.0)
MCHC: 33.3 g/dL (ref 32.0–36.0)
MCV: 91.8 fL (ref 80.0–100.0)
PLATELETS: 280 10*3/uL (ref 150–440)
RBC: 4.31 MIL/uL (ref 3.80–5.20)
RDW: 14.4 % (ref 11.5–14.5)
WBC: 11.8 10*3/uL — AB (ref 3.6–11.0)

## 2014-11-13 LAB — BASIC METABOLIC PANEL
Anion gap: 6 (ref 5–15)
BUN: 20 mg/dL (ref 6–20)
CALCIUM: 8.8 mg/dL — AB (ref 8.9–10.3)
CO2: 24 mmol/L (ref 22–32)
CREATININE: 0.96 mg/dL (ref 0.44–1.00)
Chloride: 106 mmol/L (ref 101–111)
Glucose, Bld: 137 mg/dL — ABNORMAL HIGH (ref 65–99)
Potassium: 3.5 mmol/L (ref 3.5–5.1)
SODIUM: 136 mmol/L (ref 135–145)

## 2014-11-13 SURGERY — CYSTOSCOPY, WITH STENT INSERTION
Anesthesia: General | Site: Ureter | Laterality: Left | Wound class: Clean Contaminated

## 2014-11-13 MED ORDER — SODIUM CHLORIDE 0.9 % IV SOLN
INTRAVENOUS | Status: DC | PRN
  Administered 2014-11-13: 06:00:00 via INTRAVENOUS

## 2014-11-13 MED ORDER — TAMSULOSIN HCL 0.4 MG PO CAPS: 0.4000 mg | ORAL_CAPSULE | Freq: Every day | ORAL | Status: DC

## 2014-11-13 MED ORDER — MORPHINE SULFATE (PF) 4 MG/ML IV SOLN
4.0000 mg | Freq: Once | INTRAVENOUS | Status: AC
  Administered 2014-11-13: 4 mg via INTRAVENOUS
  Filled 2014-11-13: qty 1

## 2014-11-13 MED ORDER — OXYCODONE HCL 5 MG PO TABS
5.0000 mg | ORAL_TABLET | Freq: Once | ORAL | Status: AC | PRN
  Administered 2014-11-13: 5 mg via ORAL

## 2014-11-13 MED ORDER — CEFAZOLIN SODIUM-DEXTROSE 2-3 GM-% IV SOLR
INTRAVENOUS | Status: DC | PRN
  Administered 2014-11-13: 2 g via INTRAVENOUS

## 2014-11-13 MED ORDER — SODIUM CHLORIDE 0.9 % IV BOLUS (SEPSIS)
1000.0000 mL | Freq: Once | INTRAVENOUS | Status: AC
  Administered 2014-11-13: 1000 mL via INTRAVENOUS

## 2014-11-13 MED ORDER — HYDROMORPHONE HCL 1 MG/ML IJ SOLN
INTRAMUSCULAR | Status: AC
  Administered 2014-11-13: 1 mg via INTRAVENOUS
  Filled 2014-11-13: qty 1

## 2014-11-13 MED ORDER — OXYCODONE-ACETAMINOPHEN 5-325 MG PO TABS: 1.0000 | ORAL_TABLET | ORAL | Status: DC | PRN

## 2014-11-13 MED ORDER — PROMETHAZINE HCL 25 MG/ML IJ SOLN
12.5000 mg | Freq: Once | INTRAMUSCULAR | Status: AC
  Administered 2014-11-13: 12.5 mg via INTRAVENOUS

## 2014-11-13 MED ORDER — FENTANYL CITRATE (PF) 100 MCG/2ML IJ SOLN
INTRAMUSCULAR | Status: DC | PRN
  Administered 2014-11-13 (×2): 25 ug via INTRAVENOUS

## 2014-11-13 MED ORDER — FENTANYL CITRATE (PF) 100 MCG/2ML IJ SOLN
INTRAMUSCULAR | Status: AC
  Administered 2014-11-13: 50 ug via INTRAVENOUS
  Filled 2014-11-13: qty 2

## 2014-11-13 MED ORDER — SODIUM CHLORIDE 0.9 % IR SOLN
Status: DC | PRN
  Administered 2014-11-13: 100 mL

## 2014-11-13 MED ORDER — IOTHALAMATE MEGLUMINE 43 % IV SOLN
INTRAVENOUS | Status: DC | PRN
  Administered 2014-11-13: 15 mL via URETHRAL

## 2014-11-13 MED ORDER — HYDROMORPHONE HCL 1 MG/ML IJ SOLN
1.0000 mg | Freq: Once | INTRAMUSCULAR | Status: AC
  Administered 2014-11-13: 1 mg via INTRAVENOUS

## 2014-11-13 MED ORDER — LIDOCAINE HCL (CARDIAC) 20 MG/ML IV SOLN
INTRAVENOUS | Status: DC | PRN
  Administered 2014-11-13: 100 mg via INTRAVENOUS

## 2014-11-13 MED ORDER — PROPOFOL 10 MG/ML IV BOLUS
INTRAVENOUS | Status: DC | PRN
  Administered 2014-11-13: 180 mg via INTRAVENOUS

## 2014-11-13 MED ORDER — FENTANYL CITRATE (PF) 100 MCG/2ML IJ SOLN
25.0000 ug | INTRAMUSCULAR | Status: DC | PRN
  Administered 2014-11-13 (×2): 50 ug via INTRAVENOUS

## 2014-11-13 MED ORDER — OXYCODONE HCL 5 MG PO TABS
ORAL_TABLET | ORAL | Status: AC
  Filled 2014-11-13: qty 1

## 2014-11-13 MED ORDER — OXYCODONE HCL 5 MG/5ML PO SOLN: 5.0000 mg | Freq: Once | ORAL | Status: AC | PRN

## 2014-11-13 MED ORDER — ONDANSETRON HCL 4 MG/2ML IJ SOLN
4.0000 mg | Freq: Once | INTRAMUSCULAR | Status: AC
  Administered 2014-11-13: 4 mg via INTRAVENOUS
  Filled 2014-11-13: qty 2

## 2014-11-13 MED ORDER — ONDANSETRON 4 MG PO TBDP: 4.0000 mg | ORAL_TABLET | Freq: Three times a day (TID) | ORAL | Status: DC | PRN

## 2014-11-13 MED ORDER — PROMETHAZINE HCL 25 MG/ML IJ SOLN
INTRAMUSCULAR | Status: AC
  Administered 2014-11-13: 12.5 mg via INTRAVENOUS
  Filled 2014-11-13: qty 1

## 2014-11-13 SURGICAL SUPPLY — 19 items
BAG DRAIN CYSTO-URO LG1000N (MISCELLANEOUS) ×3 IMPLANT
CATH URETL 5X70 OPEN END (CATHETERS) ×3 IMPLANT
CONRAY 43 FOR UROLOGY 50M (MISCELLANEOUS) IMPLANT
GLOVE BIO SURGEON STRL SZ7 (GLOVE) ×3 IMPLANT
GLOVE BIO SURGEON STRL SZ8 (GLOVE) ×3 IMPLANT
GOWN STRL REUS W/ TWL LRG LVL3 (GOWN DISPOSABLE) ×2 IMPLANT
GOWN STRL REUS W/TWL LRG LVL3 (GOWN DISPOSABLE) ×4
GUIDEWIRE STR ZIPWIRE 035X150 (MISCELLANEOUS) ×3 IMPLANT
KIT RM TURNOVER CYSTO AR (KITS) ×3 IMPLANT
PACK CYSTO AR (MISCELLANEOUS) ×3 IMPLANT
PREP PVP WINGED SPONGE (MISCELLANEOUS) ×3 IMPLANT
SET CYSTO W/LG BORE CLAMP LF (SET/KITS/TRAYS/PACK) ×3 IMPLANT
SOL .9 NS 3000ML IRR  AL (IV SOLUTION) ×2
SOL .9 NS 3000ML IRR UROMATIC (IV SOLUTION) ×1 IMPLANT
STENT URET 6FRX24 CONTOUR (STENTS) IMPLANT
STENT URET 6FRX26 CONTOUR (STENTS) ×3 IMPLANT
SURGILUBE 2OZ TUBE FLIPTOP (MISCELLANEOUS) ×3 IMPLANT
SYRINGE IRR TOOMEY STRL 70CC (SYRINGE) ×3 IMPLANT
WATER STERILE IRR 1000ML POUR (IV SOLUTION) ×3 IMPLANT

## 2014-11-13 NOTE — ED Notes (Signed)
Pt reports she was seen in er last night for kidney stones.  States pain is not any better tonight.  Pt has nausea.

## 2014-11-13 NOTE — Transfer of Care (Signed)
Immediate Anesthesia Transfer of Care Note  Patient: Brianna Huff  Procedure(s) Performed: Procedure(s): CYSTOSCOPY, left retrograde pyelogram WITH left ureteral STENT PLACEMENT (Left)  Patient Location: PACU  Anesthesia Type:General  Level of Consciousness: awake, alert , oriented and patient cooperative  Airway & Oxygen Therapy: Patient Spontanous Breathing and Patient connected to face mask oxygen  Post-op Assessment: Report given to RN and Post -op Vital signs reviewed and stable  Post vital signs: Reviewed and stable  Last Vitals:  Filed Vitals:   11/13/14 0510  BP: 154/85  Pulse:   Temp:   Resp:     Complications: No apparent anesthesia complications

## 2014-11-13 NOTE — ED Notes (Signed)
Dr Noah Delaine at bedside; pt voices good understanding of procedure to be performed & consent signed

## 2014-11-13 NOTE — ED Notes (Signed)
Pt uprite on stretcher in exam room with no distress noted; mother at bedside; both voice good understanding of plan for surgery and awaiting surgeon to come see her; pt dressed only in hospital gown at this time and mother has belonging bag with her clothing

## 2014-11-13 NOTE — Discharge Instructions (Signed)
Kidney Stones °Kidney stones (urolithiasis) are deposits that form inside your kidneys. The intense pain is caused by the stone moving through the urinary tract. When the stone moves, the ureter goes into spasm around the stone. The stone is usually passed in the urine.  °CAUSES  °· A disorder that makes certain neck glands produce too much parathyroid hormone (primary hyperparathyroidism). °· A buildup of uric acid crystals, similar to gout in your joints. °· Narrowing (stricture) of the ureter. °· A kidney obstruction present at birth (congenital obstruction). °· Previous surgery on the kidney or ureters. °· Numerous kidney infections. °SYMPTOMS  °· Feeling sick to your stomach (nauseous). °· Throwing up (vomiting). °· Blood in the urine (hematuria). °· Pain that usually spreads (radiates) to the groin. °· Frequency or urgency of urination. °DIAGNOSIS  °· Taking a history and physical exam. °· Blood or urine tests. °· CT scan. °· Occasionally, an examination of the inside of the urinary bladder (cystoscopy) is performed. °TREATMENT  °· Observation. °· Increasing your fluid intake. °· Extracorporeal shock wave lithotripsy--This is a noninvasive procedure that uses shock waves to break up kidney stones. °· Surgery may be needed if you have severe pain or persistent obstruction. There are various surgical procedures. Most of the procedures are performed with the use of small instruments. Only small incisions are needed to accommodate these instruments, so recovery time is minimized. °The size, location, and chemical composition are all important variables that will determine the proper choice of action for you. Talk to your health care provider to better understand your situation so that you will minimize the risk of injury to yourself and your kidney.  °HOME CARE INSTRUCTIONS  °· Drink enough water and fluids to keep your urine clear or pale yellow. This will help you to pass the stone or stone fragments. °· Strain  all urine through the provided strainer. Keep all particulate matter and stones for your health care provider to see. The stone causing the pain may be as small as a grain of salt. It is very important to use the strainer each and every time you pass your urine. The collection of your stone will allow your health care provider to analyze it and verify that a stone has actually passed. The stone analysis will often identify what you can do to reduce the incidence of recurrences. °· Only take over-the-counter or prescription medicines for pain, discomfort, or fever as directed by your health care provider. °· Keep all follow-up visits as told by your health care provider. This is important. °· Get follow-up X-rays if required. The absence of pain does not always mean that the stone has passed. It may have only stopped moving. If the urine remains completely obstructed, it can cause loss of kidney function or even complete destruction of the kidney. It is your responsibility to make sure X-rays and follow-ups are completed. Ultrasounds of the kidney can show blockages and the status of the kidney. Ultrasounds are not associated with any radiation and can be performed easily in a matter of minutes. °· Make changes to your daily diet as told by your health care provider. You may be told to: °¨ Limit the amount of salt that you eat. °¨ Eat 5 or more servings of fruits and vegetables each day. °¨ Limit the amount of meat, poultry, fish, and eggs that you eat. °· Collect a 24-hour urine sample as told by your health care provider. You may need to collect another urine sample every 6-12   months. °SEEK MEDICAL CARE IF: °· You experience pain that is progressive and unresponsive to any pain medicine you have been prescribed. °SEEK IMMEDIATE MEDICAL CARE IF:  °· Pain cannot be controlled with the prescribed medicine. °· You have a fever or shaking chills. °· The severity or intensity of pain increases over 18 hours and is not  relieved by pain medicine. °· You develop a new onset of abdominal pain. °· You feel faint or pass out. °· You are unable to urinate. °  °This information is not intended to replace advice given to you by your health care provider. Make sure you discuss any questions you have with your health care provider. °  °Document Released: 12/21/2004 Document Revised: 09/11/2014 Document Reviewed: 05/24/2012 °Elsevier Interactive Patient Education ©2016 [employer] ° °

## 2014-11-13 NOTE — Anesthesia Preprocedure Evaluation (Signed)
Anesthesia Evaluation  Patient identified by MRN, date of birth, ID band Patient awake    Reviewed: Allergy & Precautions, H&P , NPO status , Patient's Chart, lab work & pertinent test results  History of Anesthesia Complications Negative for: history of anesthetic complications  Airway Mallampati: III  TM Distance: >3 FB Neck ROM: full    Dental  (+) Poor Dentition, Chipped   Pulmonary neg shortness of breath, Current Smoker,    Pulmonary exam normal breath sounds clear to auscultation       Cardiovascular Exercise Tolerance: Good (-) angina(-) Past MI and (-) DOE negative cardio ROS Normal cardiovascular exam Rhythm:regular Rate:Normal     Neuro/Psych PSYCHIATRIC DISORDERS negative neurological ROS     GI/Hepatic Neg liver ROS, GERD  Medicated and Controlled,  Endo/Other  negative endocrine ROS  Renal/GU negative Renal ROS  negative genitourinary   Musculoskeletal   Abdominal   Peds  Hematology negative hematology ROS (+)   Anesthesia Other Findings Past Medical History:   GERD (gastroesophageal reflux disease)                       Anxiety and depression                                       Gross hematuria                                              Vaginitis                                                    Monilial vaginitis                                           Low back pain                                                Internal hemorrhoids                                         Nasal congestion                                             Lower abdominal pain                                        Past Surgical History:   NASAL SINUS SURGERY  TUBAL LIGATION                                                ABDOMINAL HYSTERECTOMY                                          Comment:still has ovaries   BMI    Body Mass Index   28.89 kg/m 2      Reproductive/Obstetrics negative OB ROS                             Anesthesia Physical Anesthesia Plan  ASA: III and emergent  Anesthesia Plan: General LMA   Post-op Pain Management:    Induction:   Airway Management Planned:   Additional Equipment:   Intra-op Plan:   Post-operative Plan:   Informed Consent: I have reviewed the patients History and Physical, chart, labs and discussed the procedure including the risks, benefits and alternatives for the proposed anesthesia with the patient or authorized representative who has indicated his/her understanding and acceptance.   Dental Advisory Given  Plan Discussed with: Anesthesiologist, CRNA and Surgeon  Anesthesia Plan Comments:         Anesthesia Quick Evaluation

## 2014-11-13 NOTE — Consult Note (Signed)
Urology Consult  Referring physician: Dr. Owens Shark Reason for referral: intractable left flank pain, left ureteral calculi  Chief Complaint: left flank pain  History of Present Illness: Brianna Huff is a 49yo seen in the ER this morning for severe, intermittent, nonradiating left flank pain with associated nausea and vomiting. She was seen 1 week ago at [facility] for gross hematuria and left flank pain. No previous hx of stones. She then  Presented to the ER yesterday for severe left flank pain and was diagnosed with a 55m left UVJ calculus with moderate hydronephrosis.  Currently she has had multiple narcotics and antiemetics which have not controlled her pain or nausea.  Past Medical History  Diagnosis Date  . GERD (gastroesophageal reflux disease)   . Anxiety and depression   . Gross hematuria   . Vaginitis   . Monilial vaginitis   . Low back pain   . Internal hemorrhoids   . Nasal congestion   . Lower abdominal pain    Past Surgical History  Procedure Laterality Date  . Nasal sinus surgery    . Tubal ligation    . Abdominal hysterectomy      still has ovaries     Medications: I have reviewed the patient's current medications. Allergies:  Allergies  Allergen Reactions  . 2,4-D Dimethylamine (Amisol) Shortness Of Breath  . Sulfa Antibiotics Itching and Shortness Of Breath  . Penicillin G     Other reaction(s): Unknown    Family History  Problem Relation Age of Onset  . Prostate cancer Neg Hx   . Kidney cancer Neg Hx    Social History:  reports that she has been smoking Cigarettes.  She has a 35 pack-year smoking history. She does not have any smokeless tobacco history on file. She reports that she drinks alcohol. She reports that she does not use illicit drugs.  Review of Systems  Gastrointestinal: Positive for nausea, vomiting and abdominal pain.  Genitourinary: Positive for hematuria and flank pain.  All other systems reviewed and are negative.   Physical Exam:  Vital  signs in last 24 hours: Temp:  [98.2 F (36.8 C)-98.6 F (37 C)] 98.2 F (36.8 C) (11/09 0508) Pulse Rate:  [76-82] 76 (11/09 0508) Resp:  [18-20] 18 (11/09 0508) BP: (121-154)/(73-80) 154/80 mmHg (11/09 0508) SpO2:  [96 %] 96 % (11/09 0047) Weight:  [86.183 kg (190 lb)] 86.183 kg (190 lb) (11/09 0047) Physical Exam  Constitutional: She is oriented to person, place, and time. She appears well-developed and well-nourished.  HENT:  Head: Normocephalic and atraumatic.  Eyes: EOM are normal. Pupils are equal, round, and reactive to light.  Neck: Normal range of motion. No thyromegaly present.  Cardiovascular: Normal rate and regular rhythm.   Respiratory: No respiratory distress.  GI: Soft. She exhibits no distension.  Musculoskeletal: Normal range of motion.  Neurological: She is alert and oriented to person, place, and time.  Skin: Skin is warm and dry.  Psychiatric: She has a normal mood and affect. Her behavior is normal. Judgment and thought content normal.    Laboratory Data:  Results for orders placed or performed during the hospital encounter of 11/13/14 (from the past 72 hour(s))  Basic metabolic panel     Status: Abnormal   Collection Time: 11/13/14  1:00 AM  Result Value Ref Range   Sodium 136 135 - 145 mmol/L   Potassium 3.5 3.5 - 5.1 mmol/L   Chloride 106 101 - 111 mmol/L   CO2 24 22 - 32  mmol/L   Glucose, Bld 137 (H) 65 - 99 mg/dL   BUN 20 6 - 20 mg/dL   Creatinine, Ser 0.96 0.44 - 1.00 mg/dL   Calcium 8.8 (L) 8.9 - 10.3 mg/dL   GFR calc non Af Amer >60 >60 mL/min   GFR calc Af Amer >60 >60 mL/min    Comment: (NOTE) The eGFR has been calculated using the CKD EPI equation. This calculation has not been validated in all clinical situations. eGFR's persistently <60 mL/min signify possible Chronic Kidney Disease.    Anion gap 6 5 - 15  CBC     Status: Abnormal   Collection Time: 11/13/14  1:00 AM  Result Value Ref Range   WBC 11.8 (H) 3.6 - 11.0 K/uL   RBC  4.31 3.80 - 5.20 MIL/uL   Hemoglobin 13.2 12.0 - 16.0 g/dL   HCT 39.5 35.0 - 47.0 %   MCV 91.8 80.0 - 100.0 fL   MCH 30.6 26.0 - 34.0 pg   MCHC 33.3 32.0 - 36.0 g/dL   RDW 14.4 11.5 - 14.5 %   Platelets 280 150 - 440 K/uL   Recent Results (from the past 240 hour(s))  Microscopic Examination     Status: Abnormal   Collection Time: 11/05/14  2:28 PM  Result Value Ref Range Status   WBC, UA None seen 0 -  5 /hpf Final   RBC, UA >30 (A) 0 -  2 /hpf Final   Epithelial Cells (non renal) 0-10 0 - 10 /hpf Final   Renal Epithel, UA None seen None seen /hpf Final   Bacteria, UA Few (A) None seen/Few Final   Creatinine:  Recent Labs  11/11/14 1944 11/13/14 0100  CREATININE 0.75 0.96   Baseline Creatinine: 0.8  Impression/Assessment:  49yo with left ureteral calculus, moderate hydronephrosis, nausea/vomiting  Plan:  The risks/benefits/alternatives to cystoscopy, left retrograde, left ureteral stent placement was explained to the patient and she understands and wishes to proceed with surgery  Brianna Huff L 11/13/2014, 5:48 AM

## 2014-11-13 NOTE — Anesthesia Postprocedure Evaluation (Signed)
  Anesthesia Post-op Note  Patient: Brianna Huff  Procedure(s) Performed: Procedure(s): CYSTOSCOPY, left retrograde pyelogram WITH left ureteral STENT PLACEMENT (Left)  Anesthesia type:General LMA  Patient location: PACU  Post pain: Pain level controlled  Post assessment: Post-op Vital signs reviewed, Patient's Cardiovascular Status Stable, Respiratory Function Stable, Patent Airway and No signs of Nausea or vomiting  Post vital signs: Reviewed and stable  Last Vitals:  Filed Vitals:   11/13/14 0808  BP: 123/59  Pulse: 76  Temp:   Resp: 18    Level of consciousness: awake, alert  and patient cooperative  Complications: No apparent anesthesia complications

## 2014-11-13 NOTE — Brief Op Note (Signed)
11/13/2014  6:31 AM  PATIENT:  Brianna Huff  49 y.o. female  PRE-OPERATIVE DIAGNOSIS:  left ureteral calculus  POST-OPERATIVE DIAGNOSIS:  left ureteral calculus  PROCEDURE:  Procedure(s): CYSTOSCOPY, left retrograde pyelogram WITH left ureteral STENT PLACEMENT (Left)  SURGEON:  Surgeon(s) and Role:    * Cleon Gustin, MD - Primary  PHYSICIAN ASSISTANT:   ASSISTANTS: none   ANESTHESIA:   general  EBL:     BLOOD ADMINISTERED:none  DRAINS: left 6x26 J ureteral stetn  LOCAL MEDICATIONS USED:  NONE  SPECIMEN:  No Specimen  DISPOSITION OF SPECIMEN:  N/A  COUNTS:  YES  TOURNIQUET:  * No tourniquets in log *  DICTATION: .Note written in EPIC  PLAN OF CARE: Discharge to home after PACU  PATIENT DISPOSITION:  PACU - hemodynamically stable.   Delay start of Pharmacological VTE agent (>24hrs) due to surgical blood loss or risk of bleeding: not applicable

## 2014-11-13 NOTE — Anesthesia Procedure Notes (Signed)
Procedure Name: LMA Insertion Date/Time: 11/13/2014 6:14 AM Performed by: Lendon Colonel Pre-anesthesia Checklist: Patient identified, Emergency Drugs available, Suction available, Patient being monitored and Timeout performed Patient Re-evaluated:Patient Re-evaluated prior to inductionOxygen Delivery Method: Circle system utilized Preoxygenation: Pre-oxygenation with 100% oxygen Intubation Type: IV induction Ventilation: Mask ventilation without difficulty LMA Size: 4.0

## 2014-11-13 NOTE — ED Provider Notes (Signed)
[facility] Emergency Department Provider Note  ____________________________________________  Time seen: 1:00 AM  I have reviewed the triage vital signs and the nursing notes.   HISTORY  Chief Complaint Flank Pain      HPI Brianna Huff is a 49 y.o. female presents with 10 out of 10 left flank pain with onset tonight. Of note patient seen the emergency department yesterday and diagnosed with a 6 x 4 mm left ureterovesicular junction kidney stone. Patient states her pain acutely worsened tonight before presentation accompanied by vomiting.patient denies any fever    Past Medical History  Diagnosis Date  . GERD (gastroesophageal reflux disease)   . Anxiety and depression   . Gross hematuria   . Vaginitis   . Monilial vaginitis   . Low back pain   . Internal hemorrhoids   . Nasal congestion   . Lower abdominal pain     Patient Active Problem List   Diagnosis Date Noted  . Gross hematuria 11/05/2014  . Blood in the urine 10/27/2014  . Frank hematuria 10/25/2014  . Acute vaginitis 10/01/2014  . Acute back pain with sciatica 09/25/2014  . Hemorrhoids, internal 09/25/2014  . Anxiety and depression 09/25/2014  . Candida vaginitis 09/25/2014  . Cigarette smoker 02/20/2014    Past Surgical History  Procedure Laterality Date  . Nasal sinus surgery    . Tubal ligation    . Abdominal hysterectomy      still has ovaries     Current Outpatient Rx  Name  Route  Sig  Dispense  Refill  . ciprofloxacin (CIPRO) 500 MG tablet   Oral   Take 1 tablet (500 mg total) by mouth 2 (two) times daily.   14 tablet   0   . citalopram (CELEXA) 10 MG tablet   Oral   Take 10 mg by mouth daily.         . meloxicam (MOBIC) 7.5 MG tablet   Oral   Take 7.5 mg by mouth daily.         Marland Kitchen omeprazole (PRILOSEC) 40 MG capsule   Oral   Take 40 mg by mouth daily.         . ondansetron (ZOFRAN ODT) 4 MG disintegrating tablet   Oral   Take 1 tablet (4 mg  total) by mouth every 8 (eight) hours as needed for nausea or vomiting.   10 tablet   0   . oxyCODONE-acetaminophen (PERCOCET/ROXICET) 5-325 MG tablet   Oral   Take 1 tablet by mouth every 4 (four) hours as needed for severe pain.   20 tablet   0   . Probiotic Product (PROBIOTIC DAILY PO)   Oral   Take by mouth.           Allergies 2,4-d dimethylamine (amisol); Sulfa antibiotics; and Penicillin g  Family History  Problem Relation Age of Onset  . Prostate cancer Neg Hx   . Kidney cancer Neg Hx     Social History Social History  Substance Use Topics  . Smoking status: Current Every Day Smoker -- 1.00 packs/day for 35 years    Types: Cigarettes  . Smokeless tobacco: None  . Alcohol Use: 0.0 oz/week    0 Standard drinks or equivalent per week    Review of Systems  Constitutional: Negative for fever. Eyes: Negative for visual changes. ENT: Negative for sore throat. Cardiovascular: Negative for chest pain. Respiratory: Negative for shortness of breath. Gastrointestinal: positive for left flank pain and vomiting  Genitourinary: Negative for dysuria. Musculoskeletal: Negative for back pain. Skin: Negative for rash. Neurological: Negative for headaches, focal weakness or numbness.   10-point ROS otherwise negative.  ____________________________________________   PHYSICAL EXAM:  VITAL SIGNS: ED Triage Vitals  Enc Vitals Group     BP 11/13/14 0047 121/73 mmHg     Pulse Rate 11/13/14 0047 82     Resp 11/13/14 0047 20     Temp 11/13/14 0047 98.6 F (37 C)     Temp Source 11/13/14 0047 Oral     SpO2 11/13/14 0047 96 %     Weight 11/13/14 0047 190 lb (86.183 kg)     Height 11/13/14 0047 5\' 8"  (1.727 m)     Head Cir --      Peak Flow --      Pain Score 11/13/14 0046 10     Pain Loc --      Pain Edu? --      Excl. in [place]? --      Constitutional: Alert and oriented. Apparent discomfort Eyes: Conjunctivae are normal. PERRL. Normal extraocular  movements. ENT   Head: Normocephalic and atraumatic.   Nose: No congestion/rhinnorhea.   Mouth/Throat: Mucous membranes are moist.   Neck: No stridor. Hematological/Lymphatic/Immunilogical: No cervical lymphadenopathy. Cardiovascular: Normal rate, regular rhythm. Normal and symmetric distal pulses are present in all extremities. No murmurs, rubs, or gallops. Respiratory: Normal respiratory effort without tachypnea nor retractions. Breath sounds are clear and equal bilaterally. No wheezes/rales/rhonchi. Gastrointestinal: Soft and nontender. No distention. There is no CVA tenderness. Genitourinary: deferred Musculoskeletal: Nontender with normal range of motion in all extremities. No joint effusions.  No lower extremity tenderness nor edema. Neurologic:  Normal speech and language. No gross focal neurologic deficits are appreciated. Speech is normal.  Skin:  Skin is warm, dry and intact. No rash noted. Psychiatric: Mood and affect are normal. Speech and behavior are normal. Patient exhibits appropriate insight and judgment.  ____________________________________________    LABS (pertinent positives/negatives)  Labs Reviewed  BASIC METABOLIC PANEL - Abnormal; Notable for the following:    Glucose, Bld 137 (*)    Calcium 8.8 (*)    All other components within normal limits  CBC - Abnormal; Notable for the following:    WBC 11.8 (*)    All other components within normal limits  URINALYSIS COMPLETEWITH MICROSCOPIC ([facility] ONLY)          INITIAL IMPRESSION / ASSESSMENT AND PLAN / ED COURSE  Pertinent labs & imaging results that were available during my care of the patient were reviewed by me and considered in my medical decision making (see chart for details).  Patient received IV morphine without any improvement in pain as such she received 1 mg IV Dilaudid without any improvement in pain. Patient did receive IV Zofran and Phenergan as antiemetic with improvement in  nausea. Given ongoing discomfort and presence of a obstructing 4 x 6 mm UVJ stone on CAT scan performed yesterday patient was discussed with Dr. Alyson Ingles urologist on call who will see the patient in consultation here in the ED  ____________________________________________   FINAL CLINICAL IMPRESSION(S) / ED DIAGNOSES  Final diagnoses:  Kidney stone on left side      Gregor Hams, MD 11/13/14 [phone]

## 2014-11-14 ENCOUNTER — Ambulatory Visit (INDEPENDENT_AMBULATORY_CARE_PROVIDER_SITE_OTHER): Payer: Managed Care, Other (non HMO) | Admitting: Urology

## 2014-11-14 ENCOUNTER — Encounter: Payer: Self-pay | Admitting: Urology

## 2014-11-14 VITALS — BP 137/84 | HR 75 | Ht 68.0 in | Wt 194.5 lb

## 2014-11-14 DIAGNOSIS — R310 Gross hematuria: Secondary | ICD-10-CM | POA: Diagnosis not present

## 2014-11-14 DIAGNOSIS — N201 Calculus of ureter: Secondary | ICD-10-CM | POA: Diagnosis not present

## 2014-11-14 NOTE — Progress Notes (Signed)
8:48 AM   Veneda Melter January 10, 1965 [id number]  Referring provider: Glendon Axe, MD [facility] [place], [place] [address]  Chief Complaint  Patient presents with  . Results    CT patient states knows results and had a stent put in yesterday    HPI: Patient is a 49 year old white female who was scheduled for a CT Urogram for gross hematuria who underwent an emergent left ureteral stent placement with Dr. Alyson Ingles on 11/13/2014 for an obstructing 4 mm UVJ stone causing intractable pain and vomiting.  She now presents for a discussion on the treatment of her stone.    Background story Patient is a 48 year old white female who presented to [place] after an episode of gross hematuria by her primary care physician for further evaluation and management.  She was on Augmentin for bronchitis and had an episode of gross hematuria during her antibiotic course. She had no other urinary symptoms at that time. She specifically denies any dysuria, suprapubic pain, history of stones, history of infections or personal history of GU malignancies.  She does have a 34-pack-year history of smoking and works as a Retail buyer.  She was having some left sided flank pain.  There is no family history of GU malignancies or stones.      Today, she is experiencing suprapubic discomfort, dysuria, gross hematuria and straining to urinate.  She was given Cipro 500 mg twice daily.   She denies fevers, chills, nausea and vomiting.     PMH: Past Medical History  Diagnosis Date  . GERD (gastroesophageal reflux disease)   . Anxiety and depression   . Gross hematuria   . Vaginitis   . Monilial vaginitis   . Low back pain   . Internal hemorrhoids   . Nasal congestion   . Lower abdominal pain   . Kidney stones   . Cyst of left kidney     Surgical History: Past Surgical History  Procedure Laterality Date  . Nasal sinus surgery    . Tubal ligation    . Abdominal hysterectomy     still has ovaries   . Cystoscopy with stent placement Left 11/13/2014    Procedure: CYSTOSCOPY, left retrograde pyelogram WITH left ureteral STENT PLACEMENT;  Surgeon: Cleon Gustin, MD;  Location: [facility] ORS;  Service: Urology;  Laterality: Left;    Home Medications:    Medication List       This list is accurate as of: 11/14/14 11:59 PM.  Always use your most recent med list.               ciprofloxacin 500 MG tablet  Commonly known as:  CIPRO  Take 1 tablet (500 mg total) by mouth 2 (two) times daily.     citalopram 10 MG tablet  Commonly known as:  CELEXA  Take 10 mg by mouth daily.     meloxicam 7.5 MG tablet  Commonly known as:  MOBIC  Take 7.5 mg by mouth daily.     omeprazole 40 MG capsule  Commonly known as:  PRILOSEC  Take 40 mg by mouth daily.     ondansetron 4 MG disintegrating tablet  Commonly known as:  ZOFRAN ODT  Take 1 tablet (4 mg total) by mouth every 8 (eight) hours as needed for nausea or vomiting.     oxyCODONE-acetaminophen 5-325 MG tablet  Commonly known as:  PERCOCET/ROXICET  Take 1 tablet by mouth every 4 (four) hours as needed  for severe pain.     PROBIOTIC DAILY PO  Take by mouth.     tamsulosin 0.4 MG Caps capsule  Commonly known as:  FLOMAX  Take 1 capsule (0.4 mg total) by mouth daily after supper.        Allergies:  Allergies  Allergen Reactions  . 2,4-D Dimethylamine (Amisol) Shortness Of Breath  . Sulfa Antibiotics Itching and Shortness Of Breath  . Omnicef [Cefdinir]   . Penicillin G     Other reaction(s): Unknown    Family History: Family History  Problem Relation Age of Onset  . Prostate cancer Neg Hx   . Kidney cancer Neg Hx   . Bladder Cancer Neg Hx     Social History:  reports that she has been smoking Cigarettes.  She has a 35 pack-year smoking history. She does not have any smokeless tobacco history on file. She reports that she drinks alcohol. She reports that she does not use illicit  drugs.  ROS: UROLOGY Frequent Urination?: No Hard to postpone urination?: No Burning/pain with urination?: Yes Get up at night to urinate?: No Leakage of urine?: No Urine stream starts and stops?: No Trouble starting stream?: No Do you have to strain to urinate?: Yes Blood in urine?: Yes Urinary tract infection?: No Sexually transmitted disease?: No Injury to kidneys or bladder?: No Painful intercourse?: No Weak stream?: No Currently pregnant?: No Vaginal bleeding?: No Last menstrual period?: n  Gastrointestinal Nausea?: No Vomiting?: No Indigestion/heartburn?: Yes Diarrhea?: No Constipation?: Yes  Constitutional Fever: No Night sweats?: No Weight loss?: No Fatigue?: No  Skin Skin rash/lesions?: No Itching?: No  Eyes Blurred vision?: No Double vision?: No  Ears/Nose/Throat Sore throat?: No Sinus problems?: Yes  Hematologic/Lymphatic Swollen glands?: No Easy bruising?: No  Cardiovascular Leg swelling?: No Chest pain?: No  Respiratory Cough?: Yes Shortness of breath?: Yes  Endocrine Excessive thirst?: Yes  Musculoskeletal Back pain?: Yes Joint pain?: No  Neurological Headaches?: No Dizziness?: No  Psychologic Depression?: No Anxiety?: No  Physical Exam: Blood pressure 137/84, pulse 75, height 5\' 8"  (1.727 m), weight 194 lb 8 oz (88.225 kg). Constitutional: Well nourished. Alert and oriented, No acute distress. HEENT: [place] AT, moist mucus membranes. Trachea midline, no masses. Cardiovascular: No clubbing, cyanosis, or edema. Respiratory: Normal respiratory effort, no increased work of breathing. GI: Abdomen is soft, non tender, non distended, no abdominal masses. Liver and spleen not palpable.  No hernias appreciated.  Stool sample for occult testing is not indicated.   GU: No CVA tenderness.  No bladder fullness or masses.   Skin: No rashes, bruises or suspicious lesions. Lymph: No cervical or inguinal adenopathy. Neurologic: Grossly  intact, no focal deficits, moving all 4 extremities. Psychiatric: Normal mood and affect.  Laboratory Data:  Urinalysis Results for orders placed or performed in visit on 11/14/14  Microscopic Examination  Result Value Ref Range   WBC, UA 6-10 (A) 0 -  5 /hpf   RBC, UA >30 (H) 0 -  2 /hpf   Epithelial Cells (non renal) 0-10 0 - 10 /hpf   Mucus, UA Present (A) Not Estab.   Bacteria, UA Many (A) None seen/Few  Urinalysis, Complete  Result Value Ref Range   Specific Gravity, UA >1.030 (H) 1.005 - 1.030   pH, UA 5.5 5.0 - 7.5   Color, UA Brown (A) Yellow   Appearance Ur Cloudy (A) Clear   Leukocytes, UA Trace (A) Negative   Protein, UA 3+ (A) Negative/Trace   Glucose, UA Negative Negative  Ketones, UA Trace (A) Negative   RBC, UA 3+ (A) Negative   Bilirubin, UA Negative Negative   Urobilinogen, Ur 0.2 0.2 - 1.0 mg/dL   Nitrite, UA Negative Negative   Microscopic Examination See below:    Pertinent Imaging CLINICAL DATA: Left flank pain with refractory nausea and vomiting. Hematuria. Symptoms for 5 days.  EXAM: CT ABDOMEN AND PELVIS WITHOUT CONTRAST  TECHNIQUE: Multidetector CT imaging of the abdomen and pelvis was performed following the standard protocol without IV contrast.  COMPARISON: CT 03/14/2014  FINDINGS: Lower chest: The included lung bases are clear.  Liver: No focal lesion allowing for lack of contrast.  Hepatobiliary: Gallbladder physiologically distended, no calcified stone. No biliary dilatation.  Pancreas: Normal.  Spleen: Normal. Splenule noted at the hilum.  Adrenal glands: No nodule.  Kidneys: Obstructing 4 x 6 mm stone at the left ureterovesicular junction with moderate resultant hydroureteronephrosis and perinephric stranding. Additional nonobstructing stones in the lower left kidney. Punctate nonobstructing stone lower right kidney without right obstructive uropathy. Right ureter is decompressed. Cyst in the left kidney,  unchanged in size allowing for differences in technique.  Stomach/Bowel: Stomach physiologically distended. There are no dilated or thickened small bowel loops. Moderate diffuse colonic diverticulosis without diverticulitis. There is submucosal fatty infiltration of the right and transverse colon suggestive of chronic inflammation, no active inflammation at this time. The appendix is not definitively seen.  Vascular/Lymphatic: No retroperitoneal adenopathy. Abdominal aorta is normal in caliber. Moderate atherosclerosis of the abdominal aorta and its branches, no aneurysm.  Reproductive: Uterus is surgically absent. No adnexal mass.  Bladder: Physiologically distended without wall thickening. No bladder stone.  Other: No free air, free fluid, or intra-abdominal fluid collection.  Musculoskeletal: There are no acute or suspicious osseous abnormalities.  IMPRESSION: 1. Obstructing 4 x 6 mm stone at the left ureterovesicular junction with moderate resultant hydroureteronephrosis and perinephric stranding. 2. Additional nonobstructing stones in both kidneys, left greater than right. 3. Diffuse diverticulosis without diverticulitis.   Electronically Signed  By: Jeb Levering M.D.  On: 11/11/2014 21:18   1. Left UVJ stone:   Patient is s/p emergent left ureteral stent placement by Dr. Alyson Ingles on 11/13/2014.  He did discuss undergoing left URS/LL/left ureteral stent exchange with the patient.  She would like to pursue this treatment.  I explained to the patient how the procedure is performed and the risks involved, such as: residual stones within the kidney or ureter may need to be addressed with another procedure at a later date and, rarely an injury to the ureter. I also informed her she would have a new stent placed after the procedure that would need to be removed in the office at a later date with a cystoscope.  I also explained the risks of general anesthesia,  such as: MI, CVA, paralysis, coma and/or death.   2. Gross hematuria:   Patient will undergo cystoscopy and bilateral retrogrades at the time of her URS/LL/ureteral stent exchange to complete her hematuria workup.    - Urinalysis, Complete   Return for cystoscopy, bilateral retrogrades and URS/LL/ureteral stent exchange.  Zara Council, [facility] [address] [place], [place] [address] [phone]

## 2014-11-15 ENCOUNTER — Telehealth: Payer: Self-pay | Admitting: Radiology

## 2014-11-15 LAB — MICROSCOPIC EXAMINATION

## 2014-11-15 LAB — URINALYSIS, COMPLETE
Bilirubin, UA: NEGATIVE
GLUCOSE, UA: NEGATIVE
NITRITE UA: NEGATIVE
PH UA: 5.5 (ref 5.0–7.5)
Specific Gravity, UA: >1.03 — ABNORMAL HIGH (ref 1.005–1.030)
Urobilinogen, Ur: 0.2 mg/dL (ref 0.2–1.0)

## 2014-11-15 NOTE — Telephone Encounter (Signed)
LMOM about pre-admit testing appt on 11/20/14 @10 :30.

## 2014-11-16 DIAGNOSIS — N201 Calculus of ureter: Secondary | ICD-10-CM | POA: Insufficient documentation

## 2014-11-17 ENCOUNTER — Emergency Department: Payer: Managed Care, Other (non HMO)

## 2014-11-17 ENCOUNTER — Emergency Department
Admission: EM | Admit: 2014-11-17 | Discharge: 2014-11-17 | Disposition: A | Discharge status: Home / Self Care | Payer: Managed Care, Other (non HMO) | Attending: Emergency Medicine | Admitting: Emergency Medicine

## 2014-11-17 ENCOUNTER — Encounter: Payer: Self-pay | Admitting: Emergency Medicine

## 2014-11-17 DIAGNOSIS — Z79899 Other long term (current) drug therapy: Secondary | ICD-10-CM | POA: Insufficient documentation

## 2014-11-17 DIAGNOSIS — Z880 Allergy status to penicillin: Secondary | ICD-10-CM | POA: Insufficient documentation

## 2014-11-17 DIAGNOSIS — Z960 Presence of urogenital implants: Secondary | ICD-10-CM | POA: Insufficient documentation

## 2014-11-17 DIAGNOSIS — F17210 Nicotine dependence, cigarettes, uncomplicated: Secondary | ICD-10-CM | POA: Insufficient documentation

## 2014-11-17 DIAGNOSIS — Z792 Long term (current) use of antibiotics: Secondary | ICD-10-CM | POA: Insufficient documentation

## 2014-11-17 DIAGNOSIS — N200 Calculus of kidney: Secondary | ICD-10-CM | POA: Diagnosis not present

## 2014-11-17 DIAGNOSIS — R1011 Right upper quadrant pain: Secondary | ICD-10-CM | POA: Diagnosis present

## 2014-11-17 LAB — CBC WITH DIFFERENTIAL/PLATELET
Basophils Absolute: 0.1 10*3/uL (ref 0–0.1)
Basophils Relative: 1 %
EOS ABS: 0.1 10*3/uL (ref 0–0.7)
EOS PCT: 1 %
HCT: 43.5 % (ref 35.0–47.0)
Hemoglobin: 14 g/dL (ref 12.0–16.0)
LYMPHS ABS: 1.4 10*3/uL (ref 1.0–3.6)
Lymphocytes Relative: 19 %
MCH: 29.8 pg (ref 26.0–34.0)
MCHC: 32.2 g/dL (ref 32.0–36.0)
MCV: 92.4 fL (ref 80.0–100.0)
MONOS PCT: 8 %
Monocytes Absolute: 0.5 10*3/uL (ref 0.2–0.9)
Neutro Abs: 5 10*3/uL (ref 1.4–6.5)
Neutrophils Relative %: 71 %
PLATELETS: 328 10*3/uL (ref 150–440)
RBC: 4.71 MIL/uL (ref 3.80–5.20)
RDW: 14.1 % (ref 11.5–14.5)
WBC: 7 10*3/uL (ref 3.6–11.0)

## 2014-11-17 LAB — BASIC METABOLIC PANEL
Anion gap: 9 (ref 5–15)
BUN: 11 mg/dL (ref 6–20)
CALCIUM: 9.5 mg/dL (ref 8.9–10.3)
CO2: 23 mmol/L (ref 22–32)
CREATININE: 0.68 mg/dL (ref 0.44–1.00)
Chloride: 105 mmol/L (ref 101–111)
GFR calc Af Amer: >60 mL/min (ref >60)
Glucose, Bld: 109 mg/dL — ABNORMAL HIGH (ref 65–99)
Potassium: 3.7 mmol/L (ref 3.5–5.1)
SODIUM: 137 mmol/L (ref 135–145)

## 2014-11-17 LAB — URINALYSIS COMPLETE WITH MICROSCOPIC (ARMC ONLY)
BILIRUBIN URINE: NEGATIVE
GLUCOSE, UA: NEGATIVE mg/dL
KETONES UR: NEGATIVE mg/dL
NITRITE: NEGATIVE
Protein, ur: 30 mg/dL — AB
SPECIFIC GRAVITY, URINE: 1.012 (ref 1.005–1.030)
pH: 7 (ref 5.0–8.0)

## 2014-11-17 LAB — CULTURE, URINE COMPREHENSIVE

## 2014-11-17 MED ORDER — ONDANSETRON HCL 4 MG/2ML IJ SOLN
4.0000 mg | Freq: Once | INTRAMUSCULAR | Status: AC
  Administered 2014-11-17: 4 mg via INTRAVENOUS
  Filled 2014-11-17: qty 2

## 2014-11-17 MED ORDER — SODIUM CHLORIDE 0.9 % IV BOLUS (SEPSIS)
500.0000 mL | Freq: Once | INTRAVENOUS | Status: AC
  Administered 2014-11-17: 500 mL via INTRAVENOUS

## 2014-11-17 MED ORDER — KETOROLAC TROMETHAMINE 30 MG/ML IJ SOLN
30.0000 mg | Freq: Once | INTRAMUSCULAR | Status: AC
  Administered 2014-11-17: 30 mg via INTRAVENOUS
  Filled 2014-11-17: qty 1

## 2014-11-17 MED ORDER — HYDROMORPHONE HCL 2 MG PO TABS: 2.0000 mg | ORAL_TABLET | Freq: Two times a day (BID) | ORAL | Status: DC | PRN

## 2014-11-17 NOTE — ED Notes (Signed)
States she is having right flank pain .The patient has a history of  Kidney stones with stent placement

## 2014-11-17 NOTE — ED Notes (Signed)
Patient c/o right flank pain that started yesterday. Patient recently dx with kidney stones in both kidneys and had a stent placed on the left side on 11/13/14. Patient also c/o constant pressure in the bladder and feeling of having to urinate

## 2014-11-17 NOTE — Discharge Instructions (Signed)
Kidney Stones °Kidney stones (urolithiasis) are deposits that form inside your kidneys. The intense pain is caused by the stone moving through the urinary tract. When the stone moves, the ureter goes into spasm around the stone. The stone is usually passed in the urine.  °CAUSES  °· A disorder that makes certain neck glands produce too much parathyroid hormone (primary hyperparathyroidism). °· A buildup of uric acid crystals, similar to gout in your joints. °· Narrowing (stricture) of the ureter. °· A kidney obstruction present at birth (congenital obstruction). °· Previous surgery on the kidney or ureters. °· Numerous kidney infections. °SYMPTOMS  °· Feeling sick to your stomach (nauseous). °· Throwing up (vomiting). °· Blood in the urine (hematuria). °· Pain that usually spreads (radiates) to the groin. °· Frequency or urgency of urination. °DIAGNOSIS  °· Taking a history and physical exam. °· Blood or urine tests. °· CT scan. °· Occasionally, an examination of the inside of the urinary bladder (cystoscopy) is performed. °TREATMENT  °· Observation. °· Increasing your fluid intake. °· Extracorporeal shock wave lithotripsy--This is a noninvasive procedure that uses shock waves to break up kidney stones. °· Surgery may be needed if you have severe pain or persistent obstruction. There are various surgical procedures. Most of the procedures are performed with the use of small instruments. Only small incisions are needed to accommodate these instruments, so recovery time is minimized. °The size, location, and chemical composition are all important variables that will determine the proper choice of action for you. Talk to your health care provider to better understand your situation so that you will minimize the risk of injury to yourself and your kidney.  °HOME CARE INSTRUCTIONS  °· Drink enough water and fluids to keep your urine clear or pale yellow. This will help you to pass the stone or stone fragments. °· Strain  all urine through the provided strainer. Keep all particulate matter and stones for your health care provider to see. The stone causing the pain may be as small as a grain of salt. It is very important to use the strainer each and every time you pass your urine. The collection of your stone will allow your health care provider to analyze it and verify that a stone has actually passed. The stone analysis will often identify what you can do to reduce the incidence of recurrences. °· Only take over-the-counter or prescription medicines for pain, discomfort, or fever as directed by your health care provider. °· Keep all follow-up visits as told by your health care provider. This is important. °· Get follow-up X-rays if required. The absence of pain does not always mean that the stone has passed. It may have only stopped moving. If the urine remains completely obstructed, it can cause loss of kidney function or even complete destruction of the kidney. It is your responsibility to make sure X-rays and follow-ups are completed. Ultrasounds of the kidney can show blockages and the status of the kidney. Ultrasounds are not associated with any radiation and can be performed easily in a matter of minutes. °· Make changes to your daily diet as told by your health care provider. You may be told to: °¨ Limit the amount of salt that you eat. °¨ Eat 5 or more servings of fruits and vegetables each day. °¨ Limit the amount of meat, poultry, fish, and eggs that you eat. °· Collect a 24-hour urine sample as told by your health care provider. You may need to collect another urine sample every 6-12   months. SEEK MEDICAL CARE IF:  You experience pain that is progressive and unresponsive to any pain medicine you have been prescribed. SEEK IMMEDIATE MEDICAL CARE IF:   Pain cannot be controlled with the prescribed medicine.  You have a fever or shaking chills.  The severity or intensity of pain increases over 18 hours and is not  relieved by pain medicine.  You develop a new onset of abdominal pain.  You feel faint or pass out.  You are unable to urinate.   This information is not intended to replace advice given to you by your health care provider. Make sure you discuss any questions you have with your health care provider.   Document Released: 12/21/2004 Document Revised: 09/11/2014 Document Reviewed: 05/24/2012 Elsevier Interactive Patient Education 2016 [place].  Renal Colic Renal colic is pain that is caused by a kidney stone. HOME CARE  Take medicines only as told by your doctor.  Ask your doctor if it is okay to take over-the-counter medicine for pain.  Drink enough fluid to keep your pee (urine) clear or pale yellow. Drink 6-8 glasses of water each day.  Eat less than 2 grams of salt per day.  Eat less protein. Some foods that have protein are meats, fish, nuts, and dairy.  Try not to eat spinach, rhubarb, nuts, or bran. GET HELP IF:  You have a fever or chills.  Your pee smells bad or looks cloudy.  You have pain or burning when you pee. GET HELP RIGHT AWAY IF:  The pain in your side (flank) or your groin suddenly gets worse.  You get confused.  You pass out.   This information is not intended to replace advice given to you by your health care provider. Make sure you discuss any questions you have with your health care provider.   Document Released: 06/09/2007 Document Revised: 01/11/2014 Document Reviewed: 10/31/2013 Elsevier Interactive Patient Education [employer].

## 2014-11-18 ENCOUNTER — Encounter
Admission: RE | Admit: 2014-11-18 | Discharge: 2014-11-18 | Disposition: A | Discharge status: Home / Self Care | Payer: Managed Care, Other (non HMO) | Source: Ambulatory Visit | Attending: Urology | Admitting: Urology

## 2014-11-18 ENCOUNTER — Telehealth: Payer: Self-pay | Admitting: Radiology

## 2014-11-18 DIAGNOSIS — Z01812 Encounter for preprocedural laboratory examination: Secondary | ICD-10-CM | POA: Insufficient documentation

## 2014-11-18 HISTORY — DX: Reserved for inherently not codable concepts without codable children: IMO0001

## 2014-11-18 NOTE — Telephone Encounter (Signed)
It is very normal to have urinary pain with a stent in place. The plastic stent can irritate the lining of the bladder and cause dysuria. She did not have any evidence of infection on her last urine culture just 4 days ago therefore I do not suspect a UTI. If she is having any other symptoms such as fatigue or fevers, she should come by the office and drop off another urine. Otherwise, I would recommend some over-the-counter Pyridium for a few days to help with the burning. If she would prefer prescription for this, please let us know.  Hollice Espy, MD

## 2014-11-18 NOTE — Telephone Encounter (Signed)
Pt had CYSTOSCOPY, left retrograde pyelogram WITH left ureteral STENT PLACEMENT on 11/13/14. Pt reports urinary pain. She is scheduled for L URS with holmium, stent exchange and cysto with B retrogrades on 11/22. States she was given a script for dilaudid in the ER over the weekend but she doesn't plan on getting it filled. States she is taking tamsulosin. Please advise.

## 2014-11-18 NOTE — Patient Instructions (Signed)
  Your procedure is scheduled on: 11/26/14 Tues Report to Day Surgery. To find out your arrival time please call [phone] between 1PM - 3PM on 11/25/14 Mon.  Remember: Instructions that are not followed completely may result in serious medical risk, up to and including death, or upon the discretion of your surgeon and anesthesiologist your surgery may need to be rescheduled.    _x___ 1. Do not eat food or drink liquids after midnight. No gum chewing or hard candies.     _x___ 2. No Alcohol for 24 hours before or after surgery.   ____ 3. Bring all medications with you on the day of surgery if instructed.    __x__ 4. Notify your doctor if there is any change in your medical condition     (cold, fever, infections).     Do not wear jewelry, make-up, hairpins, clips or nail polish.  Do not wear lotions, powders, or perfumes. You may wear deodorant.  Do not shave 48 hours prior to surgery. Men may shave face and neck.  Do not bring valuables to the hospital.    [facility] is not responsible for any belongings or valuables.               Contacts, dentures or bridgework may not be worn into surgery.  Leave your suitcase in the car. After surgery it may be brought to your room.  For patients admitted to the hospital, discharge time is determined by your                treatment team.   Patients discharged the day of surgery will not be allowed to drive home.   Please read over the following fact sheets that you were given:      _x___ Take these medicines the morning of surgery with A SIP OF WATER:    1. citalopram (CELEXA) 10 MG tablet  2. omeprazole (PRILOSEC) 40 MG capsule  3.   4.  5.  6.  ____ Fleet Enema (as directed)   ____ Use CHG Soap as directed  ____ Use inhalers on the day of surgery  ____ Stop metformin 2 days prior to surgery    ____ Take 1/2 of usual insulin dose the night before surgery and none on the morning of surgery.   ____ Stop  Coumadin/Plavix/aspirin on  _x___ Stop Anti-inflammatories on May use Tylenol as needed.   ____ Stop supplements until after surgery.    ____ Bring C-Pap to the hospital.

## 2014-11-18 NOTE — Op Note (Signed)
.  Preoperative diagnosis: Left ureteral stone  Postoperative diagnosis: Same  Procedure: 1 cystoscopy 2. Left retrograde pyelography 3.  Intraoperative fluoroscopy, under one hour, with interpretation 4. Left 6 x 26 JJ stent placement  Attending: Nicolette Bang  Anesthesia: General  Estimated blood loss: None  Drains: Left 6 x 26 JJ ureteral stent without tether  Specimens: none  Antibiotics: ancef  Findings: left distal ureteral stone. Severe hydronephrosis. No masses/lesions in the bladder. Ureteral orifices in normal anatomic location.  Indications: Patient is a 49 year old female/female with a history of left ureteral stone and intractable paina nd nausea/vomiting.  After discussing treatment options, they decided proceed with left stent placement.  Procedure her in detail: The patient was brought to the operating room and a brief timeout was done to ensure correct patient, correct procedure, correct site.  General anesthesia was administered patient was placed in dorsal lithotomy position.  Their genitalia was then prepped and draped in usual sterile fashion.  A rigid 58 French cystoscope was passed in the urethra and the bladder.  Bladder was inspected free masses or lesions.  the ureteral orifices were in the normal orthotopic locations.  a 6 french ureteral catheter was then instilled into the left ureteral orifice.  a gentle retrograde was obtained and findings noted above.  we then placed a zip wire through the ureteral catheter and advanced up to the renal pelvis.    We then placed a 6 x 26 double-j ureteral stent over the original zip wire.  We then removed the wire and good coil was noted in the the renal pelvis under fluoroscopy and the bladder under direct vision.  the bladder was then drained and this concluded the procedure which was well tolerated by patient.  Complications: None  Condition: Stable, extubated, transferred to PACU  Plan: Patient is to be discharged  home. She will have his stone extraction in 2 weeks.

## 2014-11-18 NOTE — Telephone Encounter (Signed)
Notified pt of pre-admit testing appt changed to 11/18/14 @1 :15 per pt request. Pt voices understanding.

## 2014-11-18 NOTE — Telephone Encounter (Signed)
Pt notified of Dr Cherrie Gauze message below. Advised to try using over-the-counter Pyridium and to come by the office and drop off another urine if she begins having symptoms such as fatigue or fever. Pt voices understanding.

## 2014-11-18 NOTE — ED Provider Notes (Signed)
Time Seen: Approximately 1400  I have reviewed the triage notes  Chief Complaint: Flank Pain   History of Present Illness: Brianna Huff is a 49 y.o. female who presents with recent history of renal colic with left-sided stent established. Patient was here on 11 09/2014 and had a low-grade fever at that time. She states she has been on ciprofloxacin. She denies any recent fever though was concerned because she was having some mildly increased right flank pain with some mild radiation of pain in the right upper quadrant. Patient describes some frequent constant pressure in the bladder. She states she has had prescribed Percocet at home but is been hesitant to take it because it makes her nauseated and ""feel bad "". She denies any recent hematuria today though states that she's had some intermittent blood in her urine after the stents been placed. She denies any persistent nausea or vomiting.  Past Medical History  Diagnosis Date  . GERD (gastroesophageal reflux disease)   . Anxiety and depression   . Gross hematuria   . Vaginitis   . Monilial vaginitis   . Low back pain   . Internal hemorrhoids   . Nasal congestion   . Lower abdominal pain   . Kidney stones   . Cyst of left kidney   . Shortness of breath dyspnea     Patient Active Problem List   Diagnosis Date Noted  . Left ureteral stone 11/16/2014  . Gross hematuria 11/05/2014  . Blood in the urine 10/27/2014  . Frank hematuria 10/25/2014  . Acute vaginitis 10/01/2014  . Acute back pain with sciatica 09/25/2014  . Hemorrhoids, internal 09/25/2014  . Anxiety and depression 09/25/2014  . Candida vaginitis 09/25/2014  . Cigarette smoker 02/20/2014    Past Surgical History  Procedure Laterality Date  . Nasal sinus surgery    . Tubal ligation    . Abdominal hysterectomy      still has ovaries   . Cystoscopy with stent placement Left 11/13/2014    Procedure: CYSTOSCOPY, left retrograde pyelogram WITH left ureteral STENT  PLACEMENT;  Surgeon: Cleon Gustin, MD;  Location: [facility];  Service: Urology;  Laterality: Left;    Past Surgical History  Procedure Laterality Date  . Nasal sinus surgery    . Tubal ligation    . Abdominal hysterectomy      still has ovaries   . Cystoscopy with stent placement Left 11/13/2014    Procedure: CYSTOSCOPY, left retrograde pyelogram WITH left ureteral STENT PLACEMENT;  Surgeon: Cleon Gustin, MD;  Location: [facility] OR[facility];  Service: Urology;  Laterality: Left;    Current Outpatient Rx  Name  Route  Sig  Dispense  Refill  . ciprofloxacin (CIPRO) 500 MG tablet   Oral   Take 1 tablet (500 mg total) by mouth 2 (two) times daily.   14 tablet   0   . citalopram (CELEXA) 10 MG tablet   Oral   Take 10 mg by mouth daily.         Marland Kitchen omeprazole (PRILOSEC) 40 MG capsule   Oral   Take 40 mg by mouth daily.         . ondansetron (ZOFRAN ODT) 4 MG disintegrating tablet   Oral   Take 1 tablet (4 mg total) by mouth every 8 (eight) hours as needed for nausea or vomiting.   20 tablet   0   . oxyCODONE-acetaminophen (PERCOCET/ROXICET) 5-325 MG tablet   Oral   Take 1 tablet  by mouth every 4 (four) hours as needed for severe pain.   30 tablet   0   . tamsulosin (FLOMAX) 0.4 MG CAPS capsule   Oral   Take 1 capsule (0.4 mg total) by mouth daily after supper.   30 capsule   0   . HYDROmorphone (DILAUDID) 2 MG tablet   Oral   Take 1 tablet (2 mg total) by mouth every 12 (twelve) hours as needed for severe pain.   20 tablet   0     Allergies:  2,4-d dimethylamine (amisol); Sulfa antibiotics; Penicillin g; and Omnicef  Family History: Family History  Problem Relation Age of Onset  . Prostate cancer Neg Hx   . Kidney cancer Neg Hx   . Bladder Cancer Neg Hx     Social History: Social History  Substance Use Topics  . Smoking status: Current Every Day Smoker -- 1.00 packs/day for 35 years    Types: Cigarettes  . Smokeless tobacco: None  . Alcohol Use: 0.0  oz/week    0 Standard drinks or equivalent per week     Review of Systems:   10 point review of systems was performed and was otherwise negative:  Constitutional: No fever Eyes: No visual disturbances ENT: No sore throat, ear pain Cardiac: No chest pain Respiratory: No shortness of breath, wheezing, or stridor Abdomen: No abdominal pain, no vomiting, No diarrhea Endocrine: No weight loss, No night sweats Extremities: No peripheral edema, cyanosis Skin: No rashes, easy bruising Neurologic: No focal weakness, trouble with speech or swollowing Urologic: Patient has some mild dysuria without urinary frequency and no current hematuria  Physical Exam:  ED Triage Vitals  Enc Vitals Group     BP 11/17/14 1335 150/64 mmHg     Pulse Rate 11/17/14 1335 85     Resp 11/17/14 1335 18     Temp 11/17/14 1335 97.9 F (36.6 C)     Temp Source 11/17/14 1335 Oral     SpO2 11/17/14 1335 97 %     Weight 11/17/14 1335 189 lb (85.73 kg)     Height 11/17/14 1335 5\' 8"  (1.727 m)     Head Cir --      Peak Flow --      Pain Score 11/17/14 1338 7     Pain Loc --      Pain Edu? --      Excl. in [place]? --     General: Awake , Alert , and Oriented times 3; GCS 15 Head: Normal cephalic , atraumatic Eyes: Pupils equal , round, reactive to light Nose/Throat: No nasal drainage, patent upper airway without erythema or exudate.  Neck: Supple, Full range of motion, No anterior adenopathy or palpable thyroid masses Lungs: Clear to ascultation without wheezes , rhonchi, or rales Heart: Regular rate, regular rhythm without murmurs , gallops , or rubs Abdomen: Soft, non tender without rebound, guarding , or rigidity; bowel sounds positive and symmetric in all 4 quadrants. No organomegaly .        Extremities: 2 plus symmetric pulses. No edema, clubbing or cyanosis Neurologic: normal ambulation, Motor symmetric without deficits, sensory intact Skin: warm, dry, no rashes   Labs:   All laboratory work was  reviewed including any pertinent negatives or positives listed below:  [place] - Abnormal; Notable for the following:    Glucose, Bld 109 (*)    All other components within normal limits  URINALYSIS COMPLETEWITH MICROSCOPIC ([facility] ONLY) - Abnormal;  Notable for the following:    Color, Urine YELLOW (*)    APPearance CLEAR (*)    Hgb urine dipstick 3+ (*)    Protein, ur 30 (*)    Leukocytes, UA TRACE (*)    Bacteria, UA RARE (*)    Squamous Epithelial / LPF 0-5 (*)    All other components within normal limits  CBC WITH DIFFERENTIAL/PLATELET   patient results in her urine I felt was not unexpected given her recent placement of a stent with too numerous to count red blood cells. She has rare bacteria and no significant number of leukocytes in her urine.   Radiology:   Final result by Rad Results In Interface (11/17/14 15:45:40)   Narrative:   CLINICAL DATA: 49 year old female with acute right flank pain for 1 day. Known urinary calculi with left urinary stent placed on 11/13/2014.  EXAM: RENAL / URINARY TRACT ULTRASOUND COMPLETE  COMPARISON: None.  FINDINGS: Right Kidney:  Length: 11.6 cm. Echogenicity within normal limits. No mass or hydronephrosis visualized.  Left Kidney:  Length: 11.7 cm. Echogenicity within normal limits. No mass or hydronephrosis visualized. A 2 cm upper pole cyst noted. A 7 mm nonobstructing calculus within the lower pole identified.  Bladder:  Appears normal for degree of bladder distention. A stent within the bladder is noted.  IMPRESSION: No evidence of acute abnormality.  Nonobstructing left lower pole renal calculus. Other known renal calculi identified on CT are not well visualized on this study.   Electronically Signed By: Margarette Canada M.D. On: 11/17/2014 15:45         I personally reviewed the radiologic studies     ED Course: Patient's stay here was uneventful and she was given  Toradol and Zofran with symptomatic relief of her discomfort. I advised her that she could take ibuprofen as her does not appear to be any signs of renal insufficiency at this time. A bicycle continue with the ciprofloxacin and to contact her urologist on an outpatient basis. I felt her urine results and ultrasound results weren't anything to be unexpected at this time and that she seemed to be doing okay with her stent. She's had some difficulty with the Percocet at home and I prescribed her Dilaudid and he recommended the ibuprofen. She does not appear to have any unusual developments on her ultrasound evaluation such as total obstruction, etc.    Assessment:  Renal colic Recent stent   Final Clinical Impression:   Final diagnoses:  Kidney stone on left side     Plan:  Outpatient management Patient was referred back to her urologist and advised to return here if she develops a fever, uncontrolled pain, or uncontrolled vomiting. She's been advised drink plenty of fluids. Patient was advised to return immediately if condition worsens. Patient was advised to follow up with her primary care physician or other specialized physicians involved and in their current assessment.             Daymon Larsen, MD 11/18/14 [phone]

## 2014-11-19 ENCOUNTER — Other Ambulatory Visit: Payer: Self-pay

## 2014-11-19 DIAGNOSIS — N200 Calculus of kidney: Secondary | ICD-10-CM

## 2014-11-19 MED ORDER — OXYBUTYNIN CHLORIDE 5 MG PO TABS: 5.0000 mg | ORAL_TABLET | Freq: Three times a day (TID) | ORAL | Status: DC

## 2014-11-19 NOTE — Progress Notes (Signed)
Pt called c/o burning on urination and severe pain. Per Dr. Elnoria Howard pt should take flomax morning and night along with oxybutynin. Oxybutynin called into pt pharmacy.

## 2014-11-20 ENCOUNTER — Other Ambulatory Visit: Payer: Managed Care, Other (non HMO)

## 2014-11-21 ENCOUNTER — Ambulatory Visit: Payer: Managed Care, Other (non HMO) | Admitting: Urology

## 2014-11-25 MED ORDER — MIDAZOLAM HCL 5 MG/5ML IJ SOLN
INTRAMUSCULAR | Status: AC
  Filled 2014-11-25: qty 5

## 2014-11-25 MED ORDER — ORPHENADRINE CITRATE 30 MG/ML IJ SOLN
INTRAMUSCULAR | Status: AC
  Filled 2014-11-25: qty 2

## 2014-11-25 MED ORDER — BUPIVACAINE HCL (PF) 0.25 % IJ SOLN
INTRAMUSCULAR | Status: AC
  Filled 2014-11-25: qty 30

## 2014-11-25 MED ORDER — TRIAMCINOLONE ACETONIDE 40 MG/ML IJ SUSP
INTRAMUSCULAR | Status: AC
  Filled 2014-11-25: qty 1

## 2014-11-25 MED ORDER — FENTANYL CITRATE (PF) 100 MCG/2ML IJ SOLN
INTRAMUSCULAR | Status: AC
  Filled 2014-11-25: qty 2

## 2014-11-26 ENCOUNTER — Encounter: Payer: Self-pay | Admitting: Anesthesiology

## 2014-11-26 ENCOUNTER — Ambulatory Visit
Admission: RE | Admit: 2014-11-26 | Discharge: 2014-11-26 | Disposition: A | Discharge status: Home / Self Care | Payer: Managed Care, Other (non HMO) | Source: Ambulatory Visit | Attending: Urology | Admitting: Urology

## 2014-11-26 ENCOUNTER — Ambulatory Visit: Payer: Managed Care, Other (non HMO) | Admitting: Anesthesiology

## 2014-11-26 ENCOUNTER — Encounter
Admission: RE | Disposition: A | Discharge status: Home / Self Care | Payer: Self-pay | Source: Ambulatory Visit | Attending: Urology

## 2014-11-26 DIAGNOSIS — Z880 Allergy status to penicillin: Secondary | ICD-10-CM | POA: Insufficient documentation

## 2014-11-26 DIAGNOSIS — F17210 Nicotine dependence, cigarettes, uncomplicated: Secondary | ICD-10-CM | POA: Insufficient documentation

## 2014-11-26 DIAGNOSIS — Z882 Allergy status to sulfonamides status: Secondary | ICD-10-CM | POA: Insufficient documentation

## 2014-11-26 DIAGNOSIS — N132 Hydronephrosis with renal and ureteral calculous obstruction: Secondary | ICD-10-CM | POA: Diagnosis not present

## 2014-11-26 DIAGNOSIS — R310 Gross hematuria: Secondary | ICD-10-CM | POA: Insufficient documentation

## 2014-11-26 DIAGNOSIS — B373 Candidiasis of vulva and vagina: Secondary | ICD-10-CM | POA: Diagnosis not present

## 2014-11-26 DIAGNOSIS — F329 Major depressive disorder, single episode, unspecified: Secondary | ICD-10-CM | POA: Diagnosis not present

## 2014-11-26 DIAGNOSIS — Z90710 Acquired absence of both cervix and uterus: Secondary | ICD-10-CM | POA: Diagnosis not present

## 2014-11-26 DIAGNOSIS — F419 Anxiety disorder, unspecified: Secondary | ICD-10-CM | POA: Insufficient documentation

## 2014-11-26 DIAGNOSIS — Z888 Allergy status to other drugs, medicaments and biological substances status: Secondary | ICD-10-CM | POA: Insufficient documentation

## 2014-11-26 DIAGNOSIS — K219 Gastro-esophageal reflux disease without esophagitis: Secondary | ICD-10-CM | POA: Insufficient documentation

## 2014-11-26 DIAGNOSIS — N202 Calculus of kidney with calculus of ureter: Secondary | ICD-10-CM | POA: Diagnosis not present

## 2014-11-26 DIAGNOSIS — Z87442 Personal history of urinary calculi: Secondary | ICD-10-CM | POA: Diagnosis not present

## 2014-11-26 DIAGNOSIS — N201 Calculus of ureter: Secondary | ICD-10-CM

## 2014-11-26 HISTORY — PX: CYSTOSCOPY/URETEROSCOPY/HOLMIUM LASER/STENT PLACEMENT: SHX6546

## 2014-11-26 HISTORY — PX: CYSTOSCOPY W/ RETROGRADES: SHX1426

## 2014-11-26 SURGERY — CYSTOSCOPY/URETEROSCOPY/HOLMIUM LASER/STENT PLACEMENT
Anesthesia: General | Laterality: Left | Wound class: Clean Contaminated

## 2014-11-26 MED ORDER — PHENYLEPHRINE HCL 10 MG/ML IJ SOLN
INTRAMUSCULAR | Status: DC | PRN
  Administered 2014-11-26 (×2): 100 ug via INTRAVENOUS

## 2014-11-26 MED ORDER — FENTANYL CITRATE (PF) 100 MCG/2ML IJ SOLN
25.0000 ug | INTRAMUSCULAR | Status: DC | PRN
  Administered 2014-11-26 (×2): 50 ug via INTRAVENOUS

## 2014-11-26 MED ORDER — BELLADONNA ALKALOIDS-OPIUM 16.2-60 MG RE SUPP
RECTAL | Status: AC
  Filled 2014-11-26: qty 1

## 2014-11-26 MED ORDER — CIPROFLOXACIN IN D5W 400 MG/200ML IV SOLN
INTRAVENOUS | Status: AC
  Administered 2014-11-26: 400 mg via INTRAVENOUS
  Filled 2014-11-26: qty 200

## 2014-11-26 MED ORDER — CIPROFLOXACIN IN D5W 400 MG/200ML IV SOLN
400.0000 mg | Freq: Two times a day (BID) | INTRAVENOUS | Status: DC
  Administered 2014-11-26: 400 mg via INTRAVENOUS

## 2014-11-26 MED ORDER — EPHEDRINE SULFATE 50 MG/ML IJ SOLN
INTRAMUSCULAR | Status: DC | PRN
  Administered 2014-11-26: 5 mg via INTRAVENOUS

## 2014-11-26 MED ORDER — ROCURONIUM BROMIDE 100 MG/10ML IV SOLN
INTRAVENOUS | Status: DC | PRN
  Administered 2014-11-26: 30 mg via INTRAVENOUS

## 2014-11-26 MED ORDER — OXYCODONE HCL 5 MG/5ML PO SOLN: 5.0000 mg | Freq: Once | ORAL | Status: DC | PRN

## 2014-11-26 MED ORDER — ONDANSETRON HCL 4 MG/2ML IJ SOLN
INTRAMUSCULAR | Status: DC | PRN
  Administered 2014-11-26: 4 mg via INTRAVENOUS

## 2014-11-26 MED ORDER — MIDAZOLAM HCL 2 MG/2ML IJ SOLN
INTRAMUSCULAR | Status: DC | PRN
Start: 2014-11-26 — End: 2014-11-26
  Administered 2014-11-26: 1.5 mg via INTRAVENOUS

## 2014-11-26 MED ORDER — GLYCOPYRROLATE 0.2 MG/ML IJ SOLN
INTRAMUSCULAR | Status: DC | PRN
  Administered 2014-11-26: 0.4 mg via INTRAVENOUS

## 2014-11-26 MED ORDER — LACTATED RINGERS IV SOLN
INTRAVENOUS | Status: DC
  Administered 2014-11-26: 09:00:00 via INTRAVENOUS

## 2014-11-26 MED ORDER — LIDOCAINE HCL (CARDIAC) 20 MG/ML IV SOLN
INTRAVENOUS | Status: DC | PRN
  Administered 2014-11-26: 100 mg via INTRAVENOUS

## 2014-11-26 MED ORDER — TAMSULOSIN HCL 0.4 MG PO CAPS: 0.4000 mg | ORAL_CAPSULE | Freq: Every day | ORAL | Status: DC

## 2014-11-26 MED ORDER — PROPOFOL 10 MG/ML IV BOLUS
INTRAVENOUS | Status: DC | PRN
  Administered 2014-11-26: 200 mg via INTRAVENOUS

## 2014-11-26 MED ORDER — IOTHALAMATE MEGLUMINE 43 % IV SOLN
INTRAVENOUS | Status: DC | PRN
  Administered 2014-11-26: 15 mL

## 2014-11-26 MED ORDER — FENTANYL CITRATE (PF) 100 MCG/2ML IJ SOLN
INTRAMUSCULAR | Status: AC
  Administered 2014-11-26: 50 ug via INTRAVENOUS
  Filled 2014-11-26: qty 2

## 2014-11-26 MED ORDER — OXYCODONE HCL 5 MG PO TABS: 5.0000 mg | ORAL_TABLET | Freq: Once | ORAL | Status: DC | PRN

## 2014-11-26 MED ORDER — BELLADONNA ALKALOIDS-OPIUM 16.2-60 MG RE SUPP
1.0000 | Freq: Once | RECTAL | Status: AC
  Administered 2014-11-26: 1 via RECTAL
  Filled 2014-11-26: qty 1

## 2014-11-26 MED ORDER — NEOSTIGMINE METHYLSULFATE 10 MG/10ML IV SOLN
INTRAVENOUS | Status: DC | PRN
  Administered 2014-11-26: 3 mg via INTRAVENOUS

## 2014-11-26 MED ORDER — FENTANYL CITRATE (PF) 100 MCG/2ML IJ SOLN
INTRAMUSCULAR | Status: DC | PRN
  Administered 2014-11-26: 50 ug via INTRAVENOUS
  Administered 2014-11-26: 150 ug via INTRAVENOUS

## 2014-11-26 SURGICAL SUPPLY — 31 items
ADAPTER SCOPE UROLOK II (MISCELLANEOUS) IMPLANT
BAG DRAIN CYSTO-URO LG1000N (MISCELLANEOUS) ×3 IMPLANT
BASKET ZERO TIP 1.9FR (BASKET) ×3 IMPLANT
CATH URETL 5X70 OPEN END (CATHETERS) ×3 IMPLANT
CNTNR SPEC 2.5X3XGRAD LEK (MISCELLANEOUS) ×2
CONRAY 43 FOR UROLOGY 50M (MISCELLANEOUS) ×3 IMPLANT
CONT SPEC 4OZ STER OR WHT (MISCELLANEOUS) ×1
CONTAINER SPEC 2.5X3XGRAD LEK (MISCELLANEOUS) ×2 IMPLANT
FEE TECHNICIAN ONLY PER HOUR (MISCELLANEOUS) ×6 IMPLANT
GLOVE BIO SURGEON STRL SZ 6.5 (GLOVE) ×3 IMPLANT
GLOVE BIO SURGEON STRL SZ7 (GLOVE) ×6 IMPLANT
GOWN STRL REUS W/ TWL LRG LVL3 (GOWN DISPOSABLE) ×4 IMPLANT
GOWN STRL REUS W/TWL LRG LVL3 (GOWN DISPOSABLE) ×2
INTRODUCER DILATOR DOUBLE (INTRODUCER) IMPLANT
KIT RM TURNOVER CYSTO AR (KITS) ×3 IMPLANT
LASER HOLMIUM FIBER SU 272UM (MISCELLANEOUS) IMPLANT
LASER HOLMIUM SU 200UM (MISCELLANEOUS) ×3 IMPLANT
LASER HOLMIUM SU 940UM (MISCELLANEOUS) ×3 IMPLANT
PACK CYSTO AR (MISCELLANEOUS) ×3 IMPLANT
PREP PVP WINGED SPONGE (MISCELLANEOUS) ×3 IMPLANT
PUMP SINGLE ACTION SAP (PUMP) IMPLANT
SENSORWIRE 0.038 NOT ANGLED (WIRE) ×6
SET CYSTO W/LG BORE CLAMP LF (SET/KITS/TRAYS/PACK) ×3 IMPLANT
SHEATH URETERAL 12FRX35CM (MISCELLANEOUS) IMPLANT
SOL .9 NS 3000ML IRR  AL (IV SOLUTION) ×1
SOL .9 NS 3000ML IRR UROMATIC (IV SOLUTION) ×2 IMPLANT
STENT URET 6FRX24 CONTOUR (STENTS) ×3 IMPLANT
STENT URET 6FRX26 CONTOUR (STENTS) IMPLANT
SURGILUBE 2OZ TUBE FLIPTOP (MISCELLANEOUS) ×3 IMPLANT
WATER STERILE IRR 1000ML POUR (IV SOLUTION) ×3 IMPLANT
WIRE SENSOR 0.038 NOT ANGLED (WIRE) ×4 IMPLANT

## 2014-11-26 NOTE — Anesthesia Procedure Notes (Signed)
Procedure Name: Intubation Date/Time: 11/26/2014 9:28 AM Performed by: Rosaria Ferries, Rune Mendez Pre-anesthesia Checklist: Patient identified, Emergency Drugs available, Suction available and Patient being monitored Patient Re-evaluated:Patient Re-evaluated prior to inductionOxygen Delivery Method: Circle system utilized Preoxygenation: Pre-oxygenation with 100% oxygen Intubation Type: IV induction Laryngoscope Size: Mac and 3 Grade View: Grade I Tube type: Oral Tube size: 7.0 mm Number of attempts: 1 Secured at: 21 cm Tube secured with: Tape Dental Injury: Teeth and Oropharynx as per pre-operative assessment

## 2014-11-26 NOTE — Op Note (Signed)
Date of procedure: 11/26/2014  Preoperative diagnosis:  1. Left distal ureteral stone  2. Left kidney stones  Postoperative diagnosis:  1. Same as above   Procedure: 1. Left ureteroscopy 2. Laser lithotripsy 3. Left retrograde pyelogram 4. Basket extraction of Stone fragment 5. Left ureteral stent exchange  Surgeon: Hollice Espy, MD  Anesthesia: General  Complications: None  Intraoperative findings: 6 mm left distal UVJ stone, small nonobstructing mid/ lower pole stones  EBL: minimal    Specimens: Stone fragment  Drains: 6 x 24 French double-J ureteral stent on left (string left in place)  Indication: Brianna Huff is a 49 y.o. patient with 6 mm left obstructing UVJ stone as well as some nonobstructing small left mid and lower pole stones. She previously underwent left ureteral stent placement and returns today for definitive management.  After reviewing the management options for treatment, he elected to proceed with the above surgical procedure(s). We have discussed the potential benefits and risks of the procedure, side effects of the proposed treatment, the likelihood of the patient achieving the goals of the procedure, and any potential problems that might occur during the procedure or recuperation. Informed consent has been obtained.  Description of procedure:  The patient was taken to the operating room and general anesthesia was induced.  The patient was placed in the dorsal lithotomy position, prepped and draped in the usual sterile fashion, and preoperative antibiotics were administered. A preoperative time-out was performed.   A rigid cystoscope was advanced per urethra into the bladder. Attention was turned to the left ureteral orifice from which a ureteral stent was seen emanating. The distal coil of the stent was grasped and brought to level the urethral meatus. The stent was then cannulated using a sensor wire up to level of the kidney without difficulty under  fluoroscopic guidance. The stent was then removed leaving the wire in place. The wire was then snapped in place and a semirigid 5 French ureteroscope was brought alongside the wire into the distal U over the stone was encountered. A 200  laser fiber using the settings of 0.8 Hz and 8 J to fragment the stone into approximately 8-10 smaller pieces. A 1.9 French to plus nitinol basket was then used to basket extract each and every fragment. Once the distal ureter was completely clear and the scope was advanced up to level of the iliac crest without difficulty and there were no residual stone fragments. The distal ureter appeared intact with minimal edema.  A second wire was then introduced through the ureteroscope up to level of the kidney. A flexible ureteroscope was then advanced all over this wire up to level of the renal pelvis. The safety wire remained in place. Formal pyeloscopy was then performed in each and every calyx was strictly visualized. There were 2 very small punctate stones in 2 individual lower pole calyces which were basketed extracted individually. A slightly larger approximately 3 mm stone was identified and a midpole calyx and this is also extracted. The scope was then backed to level of the UPJ and contrast was injected through the scope to create a roadmap of the upper tract. Each and every calyx was then directly visualized and noted to be completely clear of any stone or stone debris. The scope was backed down the length of the entire ureter inspecting along the way. There were no residual stone fragments or any ureteral trauma. A 6 x 24 French double-J ureteral stent was then advanced over the wire up  to level of the kidney. The wire was partially drawn and a coil was noted within the renal pelvis. The wire was then fully withdrawn and a coil was noted within the kidney. The string was left on the stent. The patient was cleaned and dried and the stent string was secured to the patient's  left inner thigh using Mastisol and Tegaderm. The patient was repositioned in the supine position reversed from anesthesia and taken to PACU in stable condition.   Plan: The patient will remove her own stent in 2 days. She will return to the office in 4 weeks with a renal ultrasound prior. Hollice Espy, M.D.

## 2014-11-26 NOTE — Anesthesia Preprocedure Evaluation (Addendum)
Anesthesia Evaluation  Patient identified by MRN, date of birth, ID band Patient awake    Reviewed: Allergy & Precautions, H&P , NPO status , Patient's Chart, lab work & pertinent test results  History of Anesthesia Complications (+) PONV and history of anesthetic complications  Airway Mallampati: III  TM Distance: >3 FB Neck ROM: full    Dental  (+) Poor Dentition, Chipped   Pulmonary neg shortness of breath, Current Smoker,    Pulmonary exam normal breath sounds clear to auscultation       Cardiovascular Exercise Tolerance: Good (-) angina(-) Past MI and (-) DOE negative cardio ROS Normal cardiovascular exam Rhythm:regular Rate:Normal     Neuro/Psych PSYCHIATRIC DISORDERS negative neurological ROS     GI/Hepatic Neg liver ROS, GERD  Medicated and Controlled,  Endo/Other  negative endocrine ROS  Renal/GU Renal disease  negative genitourinary   Musculoskeletal   Abdominal   Peds  Hematology negative hematology ROS (+)   Anesthesia Other Findings Past Medical History:   GERD (gastroesophageal reflux disease)                       Anxiety and depression                                       Gross hematuria                                              Vaginitis                                                    Monilial vaginitis                                           Low back pain                                                Internal hemorrhoids                                         Nasal congestion                                             Lower abdominal pain                                        Past Surgical History:   NASAL SINUS SURGERY  TUBAL LIGATION                                                ABDOMINAL HYSTERECTOMY                                          Comment:still has ovaries   BMI    Body Mass Index   28.89 kg/m 2      Reproductive/Obstetrics negative OB ROS                            Anesthesia Physical  Anesthesia Plan  ASA: III  Anesthesia Plan: General ETT   Post-op Pain Management:    Induction:   Airway Management Planned:   Additional Equipment:   Intra-op Plan:   Post-operative Plan:   Informed Consent: I have reviewed the patients History and Physical, chart, labs and discussed the procedure including the risks, benefits and alternatives for the proposed anesthesia with the patient or authorized representative who has indicated his/her understanding and acceptance.   Dental Advisory Given  Plan Discussed with: Anesthesiologist, CRNA and Surgeon  Anesthesia Plan Comments:        Anesthesia Quick Evaluation

## 2014-11-26 NOTE — Interval H&P Note (Signed)
History and Physical Interval Note:  11/26/2014 9:01 AM  Brianna Huff  has presented today for surgery, with the diagnosis of LEFT UVJ STONE, S/P LEFT URETERAL STENT PLACEMENT  The various methods of treatment have been discussed with the patient and family. After consideration of risks, benefits and other options for treatment, the patient has consented to  Procedure(s): CYSTOSCOPY/URETEROSCOPY/HOLMIUM LASER/STENT PLACEMENT (Left) CYSTOSCOPY WITH RETROGRADE PYELOGRAM (Bilateral) as a surgical intervention .  The patient's history has been reviewed, patient examined, no change in status, stable for surgery.  I have reviewed the patient's chart and labs.  Questions were answered to the patient's satisfaction.    RRR CTAB   Hollice Espy

## 2014-11-26 NOTE — H&P (View-Only) (Signed)
8:48 AM   Brianna Huff 1965-12-26 [id number]  Referring provider: Glendon Axe, MD [facility] [place], [place] [address]  Chief Complaint  Patient presents with  . Results    CT patient states knows results and had a stent put in yesterday    HPI: Patient is a 49 year old white female who was scheduled for a CT Urogram for gross hematuria who underwent an emergent left ureteral stent placement with Dr. Alyson Ingles on 11/13/2014 for an obstructing 4 mm UVJ stone causing intractable pain and vomiting.  She now presents for a discussion on the treatment of her stone.    Background story Patient is a 49 year old white female who presented to [place] after an episode of gross hematuria by her primary care physician for further evaluation and management.  She was on Augmentin for bronchitis and had an episode of gross hematuria during her antibiotic course. She had no other urinary symptoms at that time. She specifically denies any dysuria, suprapubic pain, history of stones, history of infections or personal history of GU malignancies.  She does have a 34-pack-year history of smoking and works as a Retail buyer.  She was having some left sided flank pain.  There is no family history of GU malignancies or stones.      Today, she is experiencing suprapubic discomfort, dysuria, gross hematuria and straining to urinate.  She was given Cipro 500 mg twice daily.   She denies fevers, chills, nausea and vomiting.     PMH: Past Medical History  Diagnosis Date  . GERD (gastroesophageal reflux disease)   . Anxiety and depression   . Gross hematuria   . Vaginitis   . Monilial vaginitis   . Low back pain   . Internal hemorrhoids   . Nasal congestion   . Lower abdominal pain   . Kidney stones   . Cyst of left kidney     Surgical History: Past Surgical History  Procedure Laterality Date  . Nasal sinus surgery    . Tubal ligation    . Abdominal hysterectomy     still has ovaries   . Cystoscopy with stent placement Left 11/13/2014    Procedure: CYSTOSCOPY, left retrograde pyelogram WITH left ureteral STENT PLACEMENT;  Surgeon: Cleon Gustin, MD;  Location: [facility] ORS;  Service: Urology;  Laterality: Left;    Home Medications:    Medication List       This list is accurate as of: 11/14/14 11:59 PM.  Always use your most recent med list.               ciprofloxacin 500 MG tablet  Commonly known as:  CIPRO  Take 1 tablet (500 mg total) by mouth 2 (two) times daily.     citalopram 10 MG tablet  Commonly known as:  CELEXA  Take 10 mg by mouth daily.     meloxicam 7.5 MG tablet  Commonly known as:  MOBIC  Take 7.5 mg by mouth daily.     omeprazole 40 MG capsule  Commonly known as:  PRILOSEC  Take 40 mg by mouth daily.     ondansetron 4 MG disintegrating tablet  Commonly known as:  ZOFRAN ODT  Take 1 tablet (4 mg total) by mouth every 8 (eight) hours as needed for nausea or vomiting.     oxyCODONE-acetaminophen 5-325 MG tablet  Commonly known as:  PERCOCET/ROXICET  Take 1 tablet by mouth every 4 (four) hours as needed  for severe pain.     PROBIOTIC DAILY PO  Take by mouth.     tamsulosin 0.4 MG Caps capsule  Commonly known as:  FLOMAX  Take 1 capsule (0.4 mg total) by mouth daily after supper.        Allergies:  Allergies  Allergen Reactions  . 2,4-D Dimethylamine (Amisol) Shortness Of Breath  . Sulfa Antibiotics Itching and Shortness Of Breath  . Omnicef [Cefdinir]   . Penicillin G     Other reaction(s): Unknown    Family History: Family History  Problem Relation Age of Onset  . Prostate cancer Neg Hx   . Kidney cancer Neg Hx   . Bladder Cancer Neg Hx     Social History:  reports that she has been smoking Cigarettes.  She has a 35 pack-year smoking history. She does not have any smokeless tobacco history on file. She reports that she drinks alcohol. She reports that she does not use illicit  drugs.  ROS: UROLOGY Frequent Urination?: No Hard to postpone urination?: No Burning/pain with urination?: Yes Get up at night to urinate?: No Leakage of urine?: No Urine stream starts and stops?: No Trouble starting stream?: No Do you have to strain to urinate?: Yes Blood in urine?: Yes Urinary tract infection?: No Sexually transmitted disease?: No Injury to kidneys or bladder?: No Painful intercourse?: No Weak stream?: No Currently pregnant?: No Vaginal bleeding?: No Last menstrual period?: n  Gastrointestinal Nausea?: No Vomiting?: No Indigestion/heartburn?: Yes Diarrhea?: No Constipation?: Yes  Constitutional Fever: No Night sweats?: No Weight loss?: No Fatigue?: No  Skin Skin rash/lesions?: No Itching?: No  Eyes Blurred vision?: No Double vision?: No  Ears/Nose/Throat Sore throat?: No Sinus problems?: Yes  Hematologic/Lymphatic Swollen glands?: No Easy bruising?: No  Cardiovascular Leg swelling?: No Chest pain?: No  Respiratory Cough?: Yes Shortness of breath?: Yes  Endocrine Excessive thirst?: Yes  Musculoskeletal Back pain?: Yes Joint pain?: No  Neurological Headaches?: No Dizziness?: No  Psychologic Depression?: No Anxiety?: No  Physical Exam: Blood pressure 137/84, pulse 75, height 5\' 8"  (1.727 m), weight 194 lb 8 oz (88.225 kg). Constitutional: Well nourished. Alert and oriented, No acute distress. HEENT: [place] AT, moist mucus membranes. Trachea midline, no masses. Cardiovascular: No clubbing, cyanosis, or edema. Respiratory: Normal respiratory effort, no increased work of breathing. GI: Abdomen is soft, non tender, non distended, no abdominal masses. Liver and spleen not palpable.  No hernias appreciated.  Stool sample for occult testing is not indicated.   GU: No CVA tenderness.  No bladder fullness or masses.   Skin: No rashes, bruises or suspicious lesions. Lymph: No cervical or inguinal adenopathy. Neurologic: Grossly  intact, no focal deficits, moving all 4 extremities. Psychiatric: Normal mood and affect.  Laboratory Data:  Urinalysis Results for orders placed or performed in visit on 11/14/14  Microscopic Examination  Result Value Ref Range   WBC, UA 6-10 (A) 0 -  5 /hpf   RBC, UA >30 (H) 0 -  2 /hpf   Epithelial Cells (non renal) 0-10 0 - 10 /hpf   Mucus, UA Present (A) Not Estab.   Bacteria, UA Many (A) None seen/Few  Urinalysis, Complete  Result Value Ref Range   Specific Gravity, UA >1.030 (H) 1.005 - 1.030   pH, UA 5.5 5.0 - 7.5   Color, UA Brown (A) Yellow   Appearance Ur Cloudy (A) Clear   Leukocytes, UA Trace (A) Negative   Protein, UA 3+ (A) Negative/Trace   Glucose, UA Negative Negative  Ketones, UA Trace (A) Negative   RBC, UA 3+ (A) Negative   Bilirubin, UA Negative Negative   Urobilinogen, Ur 0.2 0.2 - 1.0 mg/dL   Nitrite, UA Negative Negative   Microscopic Examination See below:    Pertinent Imaging CLINICAL DATA: Left flank pain with refractory nausea and vomiting. Hematuria. Symptoms for 5 days.  EXAM: CT ABDOMEN AND PELVIS WITHOUT CONTRAST  TECHNIQUE: Multidetector CT imaging of the abdomen and pelvis was performed following the standard protocol without IV contrast.  COMPARISON: CT 03/14/2014  FINDINGS: Lower chest: The included lung bases are clear.  Liver: No focal lesion allowing for lack of contrast.  Hepatobiliary: Gallbladder physiologically distended, no calcified stone. No biliary dilatation.  Pancreas: Normal.  Spleen: Normal. Splenule noted at the hilum.  Adrenal glands: No nodule.  Kidneys: Obstructing 4 x 6 mm stone at the left ureterovesicular junction with moderate resultant hydroureteronephrosis and perinephric stranding. Additional nonobstructing stones in the lower left kidney. Punctate nonobstructing stone lower right kidney without right obstructive uropathy. Right ureter is decompressed. Cyst in the left kidney,  unchanged in size allowing for differences in technique.  Stomach/Bowel: Stomach physiologically distended. There are no dilated or thickened small bowel loops. Moderate diffuse colonic diverticulosis without diverticulitis. There is submucosal fatty infiltration of the right and transverse colon suggestive of chronic inflammation, no active inflammation at this time. The appendix is not definitively seen.  Vascular/Lymphatic: No retroperitoneal adenopathy. Abdominal aorta is normal in caliber. Moderate atherosclerosis of the abdominal aorta and its branches, no aneurysm.  Reproductive: Uterus is surgically absent. No adnexal mass.  Bladder: Physiologically distended without wall thickening. No bladder stone.  Other: No free air, free fluid, or intra-abdominal fluid collection.  Musculoskeletal: There are no acute or suspicious osseous abnormalities.  IMPRESSION: 1. Obstructing 4 x 6 mm stone at the left ureterovesicular junction with moderate resultant hydroureteronephrosis and perinephric stranding. 2. Additional nonobstructing stones in both kidneys, left greater than right. 3. Diffuse diverticulosis without diverticulitis.   Electronically Signed  By: Jeb Levering M.D.  On: 11/11/2014 21:18   1. Left UVJ stone:   Patient is s/p emergent left ureteral stent placement by Dr. Alyson Ingles on 11/13/2014.  He did discuss undergoing left URS/LL/left ureteral stent exchange with the patient.  She would like to pursue this treatment.  I explained to the patient how the procedure is performed and the risks involved, such as: residual stones within the kidney or ureter may need to be addressed with another procedure at a later date and, rarely an injury to the ureter. I also informed her she would have a new stent placed after the procedure that would need to be removed in the office at a later date with a cystoscope.  I also explained the risks of general anesthesia,  such as: MI, CVA, paralysis, coma and/or death.   2. Gross hematuria:   Patient will undergo cystoscopy and bilateral retrogrades at the time of her URS/LL/ureteral stent exchange to complete her hematuria workup.    - Urinalysis, Complete   Return for cystoscopy, bilateral retrogrades and URS/LL/ureteral stent exchange.  Zara Council, [facility] [address] [place], [place] [address] [phone]

## 2014-11-26 NOTE — Discharge Instructions (Signed)
You have a ureteral stent in place.  This is a tube that extends from your kidney to your bladder.  This may cause urinary bleeding, burning with urination, and urinary frequency.  Please call our office or present to the ED if you develop fevers >101 or pain which is not able to be controlled with oral pain medications.  You may be given either Flomax and/ or ditropan to help with bladder spasms and stent pain in addition to pain medications.    Your stent is on a string taped to your right inner thigh. On Thursday, you may untaped the stent string and pulled gently until the entire stent is removed. If you have any concerns or questions, please call our office. Since it will be a holiday, you may be forwarded to an answering service and a nurse will advise you and/ or contact the doctor as needed.  [facility] [address] [place], [place] [address] [phone]      AMBULATORY SURGERY  DISCHARGE INSTRUCTIONS   1) The drugs that you were given will stay in your system until tomorrow so for the next 24 hours you should not:  A) Drive an automobile B) Make any legal decisions C) Drink any alcoholic beverage   2) You may resume regular meals tomorrow.  Today it is better to start with liquids and gradually work up to solid foods.  You may eat anything you prefer, but it is better to start with liquids, then soup and crackers, and gradually work up to solid foods.   3) Please notify your doctor immediately if you have any unusual bleeding, trouble breathing, redness and pain at the surgery site, drainage, fever, or pain not relieved by medication.    4) Additional Instructions:        Please contact your physician with any problems or Same Day Surgery at [phone], Monday through Friday 6 am to 4 pm, or [place] at [facility] number at [phone].

## 2014-11-26 NOTE — Transfer of Care (Signed)
Immediate Anesthesia Transfer of Care Note  Patient: Brianna Huff  Procedure(s) Performed: Procedure(s): CYSTOSCOPY/URETEROSCOPY/HOLMIUM LASER/STENT PLACEMENT (Left) CYSTOSCOPY WITH RETROGRADE PYELOGRAM (Bilateral)  Patient Location: PACU  Anesthesia Type:General  Level of Consciousness: awake, alert , oriented and patient cooperative  Airway & Oxygen Therapy: Patient Spontanous Breathing and Patient connected to nasal cannula oxygen  Post-op Assessment: Report given to RN and Post -op Vital signs reviewed and stable  Post vital signs: Reviewed and stable  Last Vitals:  Filed Vitals:   11/26/14 0852 11/26/14 1036  BP: 128/70 127/63  Pulse: 97 103  Temp: 37.5 C 36.2 C  Resp: 16 20    Complications: No apparent anesthesia complications

## 2014-11-26 NOTE — Anesthesia Postprocedure Evaluation (Signed)
Anesthesia Post Note  Patient: GARDENIA BEIER  Procedure(s) Performed: Procedure(s) (LRB): CYSTOSCOPY/URETEROSCOPY/HOLMIUM LASER/STENT PLACEMENT (Left) CYSTOSCOPY WITH RETROGRADE PYELOGRAM (Bilateral)  Patient location during evaluation: PACU Anesthesia Type: General Level of consciousness: awake and alert Pain management: pain level controlled Vital Signs Assessment: post-procedure vital signs reviewed and stable Respiratory status: spontaneous breathing, nonlabored ventilation, respiratory function stable and patient connected to nasal cannula oxygen Cardiovascular status: blood pressure returned to baseline and stable Postop Assessment: No signs of nausea or vomiting Anesthetic complications: no    Last Vitals:  Filed Vitals:   11/26/14 1121 11/26/14 1140  BP: 121/65 127/69  Pulse: 79 71  Temp: 36.7 C   Resp: 22 20    Last Pain:  Filed Vitals:   11/26/14 1141  PainSc: 3                  Precious Haws Jhonnie Aliano

## 2014-11-28 ENCOUNTER — Emergency Department: Payer: Managed Care, Other (non HMO)

## 2014-11-28 ENCOUNTER — Emergency Department
Admission: EM | Admit: 2014-11-28 | Discharge: 2014-11-28 | Disposition: A | Discharge status: Home / Self Care | Payer: Managed Care, Other (non HMO) | Attending: Emergency Medicine | Admitting: Emergency Medicine

## 2014-11-28 DIAGNOSIS — Z880 Allergy status to penicillin: Secondary | ICD-10-CM | POA: Insufficient documentation

## 2014-11-28 DIAGNOSIS — M549 Dorsalgia, unspecified: Secondary | ICD-10-CM | POA: Diagnosis present

## 2014-11-28 DIAGNOSIS — Z79899 Other long term (current) drug therapy: Secondary | ICD-10-CM | POA: Insufficient documentation

## 2014-11-28 DIAGNOSIS — F17210 Nicotine dependence, cigarettes, uncomplicated: Secondary | ICD-10-CM | POA: Diagnosis not present

## 2014-11-28 DIAGNOSIS — N201 Calculus of ureter: Secondary | ICD-10-CM | POA: Diagnosis not present

## 2014-11-28 DIAGNOSIS — Z792 Long term (current) use of antibiotics: Secondary | ICD-10-CM | POA: Diagnosis not present

## 2014-11-28 LAB — CBC
HCT: 42.4 % (ref 35.0–47.0)
Hemoglobin: 14.2 g/dL (ref 12.0–16.0)
MCH: 30.7 pg (ref 26.0–34.0)
MCHC: 33.6 g/dL (ref 32.0–36.0)
MCV: 91.4 fL (ref 80.0–100.0)
PLATELETS: 298 10*3/uL (ref 150–440)
RBC: 4.64 MIL/uL (ref 3.80–5.20)
RDW: 13.2 % (ref 11.5–14.5)
WBC: 12.2 10*3/uL — AB (ref 3.6–11.0)

## 2014-11-28 LAB — COMPREHENSIVE METABOLIC PANEL
ALT: 21 U/L (ref 14–54)
ANION GAP: 7 (ref 5–15)
AST: 26 U/L (ref 15–41)
Albumin: 4.2 g/dL (ref 3.5–5.0)
Alkaline Phosphatase: 80 U/L (ref 38–126)
BILIRUBIN TOTAL: 0.4 mg/dL (ref 0.3–1.2)
BUN: 14 mg/dL (ref 6–20)
CHLORIDE: 105 mmol/L (ref 101–111)
CO2: 26 mmol/L (ref 22–32)
Calcium: 9.6 mg/dL (ref 8.9–10.3)
Creatinine, Ser: 0.74 mg/dL (ref 0.44–1.00)
Glucose, Bld: 127 mg/dL — ABNORMAL HIGH (ref 65–99)
POTASSIUM: 3.8 mmol/L (ref 3.5–5.1)
Sodium: 138 mmol/L (ref 135–145)
TOTAL PROTEIN: 7.4 g/dL (ref 6.5–8.1)

## 2014-11-28 LAB — URINALYSIS COMPLETE WITH MICROSCOPIC (ARMC ONLY)
BILIRUBIN URINE: NEGATIVE
Glucose, UA: NEGATIVE mg/dL
KETONES UR: NEGATIVE mg/dL
NITRITE: NEGATIVE
PH: 5 (ref 5.0–8.0)
PROTEIN: 30 mg/dL — AB
SPECIFIC GRAVITY, URINE: 1.017 (ref 1.005–1.030)

## 2014-11-28 MED ORDER — LIDOCAINE HCL (PF) 1 % IJ SOLN
INTRAMUSCULAR | Status: AC
  Filled 2014-11-28: qty 10

## 2014-11-28 MED ORDER — TAMSULOSIN HCL 0.4 MG PO CAPS: 0.4000 mg | ORAL_CAPSULE | Freq: Every day | ORAL | Status: DC

## 2014-11-28 MED ORDER — SODIUM CHLORIDE 0.9 % IV BOLUS (SEPSIS)
1000.0000 mL | Freq: Once | INTRAVENOUS | Status: AC
  Administered 2014-11-28: 1000 mL via INTRAVENOUS

## 2014-11-28 MED ORDER — HYDROMORPHONE HCL 1 MG/ML IJ SOLN
1.0000 mg | Freq: Once | INTRAMUSCULAR | Status: AC
  Administered 2014-11-28: 1 mg via INTRAVENOUS
  Filled 2014-11-28: qty 1

## 2014-11-28 MED ORDER — KETOROLAC TROMETHAMINE 30 MG/ML IJ SOLN
30.0000 mg | Freq: Once | INTRAMUSCULAR | Status: AC
  Administered 2014-11-28: 30 mg via INTRAVENOUS
  Filled 2014-11-28: qty 1

## 2014-11-28 MED ORDER — ONDANSETRON HCL 4 MG/2ML IJ SOLN
4.0000 mg | Freq: Once | INTRAMUSCULAR | Status: AC
  Administered 2014-11-28: 4 mg via INTRAVENOUS
  Filled 2014-11-28: qty 2

## 2014-11-28 MED ORDER — OXYCODONE-ACETAMINOPHEN 5-325 MG PO TABS: 1.0000 | ORAL_TABLET | Freq: Four times a day (QID) | ORAL | Status: DC | PRN

## 2014-11-28 NOTE — Discharge Instructions (Signed)
Kidney Stones °Kidney stones (urolithiasis) are deposits that form inside your kidneys. The intense pain is caused by the stone moving through the urinary tract. When the stone moves, the ureter goes into spasm around the stone. The stone is usually passed in the urine.  °CAUSES  °· A disorder that makes certain neck glands produce too much parathyroid hormone (primary hyperparathyroidism). °· A buildup of uric acid crystals, similar to gout in your joints. °· Narrowing (stricture) of the ureter. °· A kidney obstruction present at birth (congenital obstruction). °· Previous surgery on the kidney or ureters. °· Numerous kidney infections. °SYMPTOMS  °· Feeling sick to your stomach (nauseous). °· Throwing up (vomiting). °· Blood in the urine (hematuria). °· Pain that usually spreads (radiates) to the groin. °· Frequency or urgency of urination. °DIAGNOSIS  °· Taking a history and physical exam. °· Blood or urine tests. °· CT scan. °· Occasionally, an examination of the inside of the urinary bladder (cystoscopy) is performed. °TREATMENT  °· Observation. °· Increasing your fluid intake. °· Extracorporeal shock wave lithotripsy--This is a noninvasive procedure that uses shock waves to break up kidney stones. °· Surgery may be needed if you have severe pain or persistent obstruction. There are various surgical procedures. Most of the procedures are performed with the use of small instruments. Only small incisions are needed to accommodate these instruments, so recovery time is minimized. °The size, location, and chemical composition are all important variables that will determine the proper choice of action for you. Talk to your health care provider to better understand your situation so that you will minimize the risk of injury to yourself and your kidney.  °HOME CARE INSTRUCTIONS  °· Drink enough water and fluids to keep your urine clear or pale yellow. This will help you to pass the stone or stone fragments. °· Strain  all urine through the provided strainer. Keep all particulate matter and stones for your health care provider to see. The stone causing the pain may be as small as a grain of salt. It is very important to use the strainer each and every time you pass your urine. The collection of your stone will allow your health care provider to analyze it and verify that a stone has actually passed. The stone analysis will often identify what you can do to reduce the incidence of recurrences. °· Only take over-the-counter or prescription medicines for pain, discomfort, or fever as directed by your health care provider. °· Keep all follow-up visits as told by your health care provider. This is important. °· Get follow-up X-rays if required. The absence of pain does not always mean that the stone has passed. It may have only stopped moving. If the urine remains completely obstructed, it can cause loss of kidney function or even complete destruction of the kidney. It is your responsibility to make sure X-rays and follow-ups are completed. Ultrasounds of the kidney can show blockages and the status of the kidney. Ultrasounds are not associated with any radiation and can be performed easily in a matter of minutes. °· Make changes to your daily diet as told by your health care provider. You may be told to: °¨ Limit the amount of salt that you eat. °¨ Eat 5 or more servings of fruits and vegetables each day. °¨ Limit the amount of meat, poultry, fish, and eggs that you eat. °· Collect a 24-hour urine sample as told by your health care provider. You may need to collect another urine sample every 6-12   months. °SEEK MEDICAL CARE IF: °· You experience pain that is progressive and unresponsive to any pain medicine you have been prescribed. °SEEK IMMEDIATE MEDICAL CARE IF:  °· Pain cannot be controlled with the prescribed medicine. °· You have a fever or shaking chills. °· The severity or intensity of pain increases over 18 hours and is not  relieved by pain medicine. °· You develop a new onset of abdominal pain. °· You feel faint or pass out. °· You are unable to urinate. °  °This information is not intended to replace advice given to you by your health care provider. Make sure you discuss any questions you have with your health care provider. °  °Document Released: 12/21/2004 Document Revised: 09/11/2014 Document Reviewed: 05/24/2012 °Elsevier Interactive Patient Education ©2016 [employer] ° °

## 2014-11-28 NOTE — ED Notes (Signed)
According to EMS, pt states she has experienced increased lower back pain since removing a urethral stent this AM.  Pt arrives to [facility] ED A+OX4, speaking in complete sentences.

## 2014-11-28 NOTE — ED Provider Notes (Addendum)
[facility] Emergency Department Provider Note  Time seen: 12:47 PM  I have reviewed the triage vital signs and the nursing notes.   HISTORY  Chief Complaint Back Pain    HPI Brianna Huff is a 49 y.o. female with a past medical history of reflux, anxiety, kidney stones, who presents the emergency department with left flank pain. According to the patient she had a ureteral stent placed 4 days ago. She was told by her urologist to pull the stent today (Dr. Erlene Quan). Patient states she pulled the stent at 10 AM, by 11 AM she states she was in severe pain to the left flank which felt similar to her kidney stone pain she was experiencing prior to the stent. States the pain has progressively worsened, now with nausea and vomiting, so the patient came to the emergency department for evaluation. Denies fever. Describes the pain is located in her left flank/left back, 10/10. Sharp/stabbing pain.     Past Medical History  Diagnosis Date  . GERD (gastroesophageal reflux disease)   . Anxiety and depression   . Gross hematuria   . Vaginitis   . Monilial vaginitis   . Low back pain   . Internal hemorrhoids   . Nasal congestion   . Lower abdominal pain   . Kidney stones   . Cyst of left kidney   . Shortness of breath dyspnea     Patient Active Problem List   Diagnosis Date Noted  . Left ureteral stone 11/16/2014  . Gross hematuria 11/05/2014  . Blood in the urine 10/27/2014  . Frank hematuria 10/25/2014  . Acute vaginitis 10/01/2014  . Acute back pain with sciatica 09/25/2014  . Hemorrhoids, internal 09/25/2014  . Anxiety and depression 09/25/2014  . Candida vaginitis 09/25/2014  . Cigarette smoker 02/20/2014    Past Surgical History  Procedure Laterality Date  . Nasal sinus surgery    . Tubal ligation    . Abdominal hysterectomy      still has ovaries   . Cystoscopy with stent placement Left 11/13/2014    Procedure: CYSTOSCOPY, left retrograde  pyelogram WITH left ureteral STENT PLACEMENT;  Surgeon: Cleon Gustin, MD;  Location: [facility] ORS;  Service: Urology;  Laterality: Left;  . Cystoscopy/ureteroscopy/holmium laser/stent placement Left 11/26/2014    Procedure: CYSTOSCOPY/URETEROSCOPY/HOLMIUM LASER/STENT PLACEMENT;  Surgeon: Hollice Espy, MD;  Location: [facility] ORS;  Service: Urology;  Laterality: Left;  . Cystoscopy w/ retrogrades Bilateral 11/26/2014    Procedure: CYSTOSCOPY WITH RETROGRADE PYELOGRAM;  Surgeon: Hollice Espy, MD;  Location: [facility] ORS;  Service: Urology;  Laterality: Bilateral;    Current Outpatient Rx  Name  Route  Sig  Dispense  Refill  . ciprofloxacin (CIPRO) 500 MG tablet   Oral   Take 1 tablet (500 mg total) by mouth 2 (two) times daily. Patient not taking: Reported on 11/26/2014   14 tablet   0   . citalopram (CELEXA) 10 MG tablet   Oral   Take 10 mg by mouth daily.         Marland Kitchen HYDROmorphone (DILAUDID) 2 MG tablet   Oral   Take 1 tablet (2 mg total) by mouth every 12 (twelve) hours as needed for severe pain.   20 tablet   0   . omeprazole (PRILOSEC) 40 MG capsule   Oral   Take 40 mg by mouth daily.         . ondansetron (ZOFRAN ODT) 4 MG disintegrating tablet   Oral   Take  1 tablet (4 mg total) by mouth every 8 (eight) hours as needed for nausea or vomiting. Patient not taking: Reported on 11/26/2014   20 tablet   0   . oxybutynin (DITROPAN) 5 MG tablet   Oral   Take 1 tablet (5 mg total) by mouth 3 (three) times daily.   270 tablet   0   . oxyCODONE-acetaminophen (PERCOCET/ROXICET) 5-325 MG tablet   Oral   Take 1 tablet by mouth every 4 (four) hours as needed for severe pain.   30 tablet   0   . tamsulosin (FLOMAX) 0.4 MG CAPS capsule   Oral   Take 1 capsule (0.4 mg total) by mouth daily after supper.   30 capsule   0     Allergies 2,4-d dimethylamine (amisol); Sulfa antibiotics; Penicillin g; and Omnicef  Family History  Problem Relation Age of Onset  . Prostate  cancer Neg Hx   . Kidney cancer Neg Hx   . Bladder Cancer Neg Hx     Social History Social History  Substance Use Topics  . Smoking status: Current Every Day Smoker -- 1.00 packs/day for 35 years    Types: Cigarettes  . Smokeless tobacco: None  . Alcohol Use: 0.0 oz/week    0 Standard drinks or equivalent per week    Review of Systems Constitutional: Negative for fever. Cardiovascular: Negative for chest pain. Respiratory: Negative for shortness of breath. Gastrointestinal: Left flank pain. Genitourinary: Negative for dysuria. Mild hematuria for patient. Musculoskeletal: Positive left back pain. Neurological: Negative for headache 10-point ROS otherwise negative.  ____________________________________________   PHYSICAL EXAM:  VITAL SIGNS: ED Triage Vitals  Enc Vitals Group     BP 11/28/14 1240 123/62 mmHg     Pulse Rate 11/28/14 1240 72     Resp 11/28/14 1240 16     Temp 11/28/14 1240 98.7 F (37.1 C)     Temp Source 11/28/14 1240 Oral     SpO2 11/28/14 1240 100 %     Weight --      Height --      Head Cir --      Peak Flow --      Pain Score 11/28/14 1243 10     Pain Loc --      Pain Edu? --      Excl. in [place]? --     Constitutional: Alert and oriented. Mild distress due to pain. Eyes: Normal exam ENT   Head: Normocephalic and atraumatic.   Mouth/Throat: Mucous membranes are moist. Cardiovascular: Normal rate, regular rhythm. No murmur Respiratory: Normal respiratory effort without tachypnea nor retractions. Breath sounds are clear Gastrointestinal: Soft and nontender. No distention.   Musculoskeletal: Nontender with normal range of motion in all extremities.  Neurologic:  Normal speech and language. No gross focal neurologic deficits  Skin:  Skin is warm, dry and intact.  Psychiatric: Mood and affect are normal. Speech and behavior are normal.  ____________________________________________   RADIOLOGY  CT shows 2 mm distal left ureteral  stone  ____________________________________________    INITIAL IMPRESSION / ASSESSMENT AND PLAN / ED COURSE  Pertinent labs & imaging results that were available during my care of the patient were reviewed by me and considered in my medical decision making (see chart for details).  Patient presents to the emergency department left flank pain one hour after pulling her left ureteral stent. We will check labs, and treat discomfort and nausea, IV hydrate, and discussed with urology appropriate imaging.  Discussed  with urology, I do not believe imaging would be of additional utility. Recommend labs and pain control.  Patient's pain is gone at this time. CT shows a 2 mm distal left ureteral stone. Labs do show white and red blood cells within the urine, likely due to inflammation from pulling her ureteral stent however given a known kidney stone with white blood cells in the urine we will send a culture and start the patient on antibiotics. The patient is to call Dr. Cherrie Gauze office tomorrow to arrange a follow-up appointment as soon as possible. I discussed return precautions with the patient including fever, she is agreeable.   I sent a urine culture, however I mistakenly discharge the patient before prescribing her an antibiotic. I have called the patient and discussed this with her. I have called in a prescription for ciprofloxacin to her local [facility] she will be picking up in the morning. ____________________________________________   FINAL CLINICAL IMPRESSION(S) / ED DIAGNOSES  Ureterolithiasis   Harvest Dark, MD 11/28/14 1503  Harvest Dark, MD 11/28/14 1614  Harvest Dark, MD 11/28/14 1620

## 2014-12-03 LAB — STONE ANALYSIS
CA PHOS CRY STONE QL IR: 10 %
Ca Oxalate,Dihydrate: 5 %
Ca Oxalate,Monohydr.: 85 %
Stone Weight KSTONE: 43 mg

## 2014-12-10 ENCOUNTER — Other Ambulatory Visit: Payer: Self-pay | Admitting: Urology

## 2014-12-10 DIAGNOSIS — N200 Calculus of kidney: Secondary | ICD-10-CM

## 2014-12-24 ENCOUNTER — Ambulatory Visit
Admission: RE | Admit: 2014-12-24 | Discharge: 2014-12-24 | Disposition: A | Discharge status: Home / Self Care | Payer: Managed Care, Other (non HMO) | Source: Ambulatory Visit | Attending: Urology | Admitting: Urology

## 2014-12-24 DIAGNOSIS — N201 Calculus of ureter: Secondary | ICD-10-CM

## 2014-12-24 DIAGNOSIS — N281 Cyst of kidney, acquired: Secondary | ICD-10-CM | POA: Diagnosis not present

## 2014-12-24 DIAGNOSIS — N200 Calculus of kidney: Secondary | ICD-10-CM | POA: Diagnosis not present

## 2014-12-25 ENCOUNTER — Ambulatory Visit: Payer: Managed Care, Other (non HMO) | Admitting: Urology

## 2014-12-25 ENCOUNTER — Ambulatory Visit (INDEPENDENT_AMBULATORY_CARE_PROVIDER_SITE_OTHER): Payer: Managed Care, Other (non HMO) | Admitting: Urology

## 2014-12-25 ENCOUNTER — Encounter: Payer: Self-pay | Admitting: Urology

## 2014-12-25 VITALS — BP 139/79 | HR 94 | Ht 68.0 in | Wt 186.8 lb

## 2014-12-25 DIAGNOSIS — N201 Calculus of ureter: Secondary | ICD-10-CM | POA: Diagnosis not present

## 2014-12-25 LAB — URINALYSIS, COMPLETE
Bilirubin, UA: NEGATIVE
GLUCOSE, UA: NEGATIVE
Ketones, UA: NEGATIVE
Leukocytes, UA: NEGATIVE
NITRITE UA: NEGATIVE
Specific Gravity, UA: 1.025 (ref 1.005–1.030)
UUROB: 0.2 mg/dL (ref 0.2–1.0)
pH, UA: 7 (ref 5.0–7.5)

## 2014-12-25 LAB — MICROSCOPIC EXAMINATION

## 2014-12-25 NOTE — Progress Notes (Signed)
12/25/2014 4:53 PM   Brianna Huff 1965-02-25 [id number]  Referring provider: Glendon Axe, MD [facility] [place], [place] [address]  Chief Complaint  Patient presents with  . Results    renal u/s    HPI:   49 year old female with history of kidney stones who underwent left ureteroscopy, laser lithotripsy and stent placement on 11/26/2014 for a 6 mm left distal UVJ stone along with other small nonobstructing mid and lower pole stones. She tolerated the procedure well but unfortunately had an episode of ureteral spasm after her stent was removed and ended up going to the ER. Her pain quickly resolved and she has no complaints today.  No dysuria or gross hematuria.  Stone analysis reveals stone composition with 5% calcium oxalate dihydrate, 85% calcium oxalate monohydrate, 10% calcium phosphate.  Follow-up renal ultrasound shows a punctate left lower pole stone and no residual hydronephrosis.  No history of stones prior to this episode. She does endorse drinking plenty of water.   PMH: Past Medical History  Diagnosis Date  . GERD (gastroesophageal reflux disease)   . Anxiety and depression   . Gross hematuria   . Vaginitis   . Monilial vaginitis   . Low back pain   . Internal hemorrhoids   . Nasal congestion   . Lower abdominal pain   . Kidney stones   . Cyst of left kidney   . Shortness of breath dyspnea     Surgical History: Past Surgical History  Procedure Laterality Date  . Nasal sinus surgery    . Tubal ligation    . Abdominal hysterectomy      still has ovaries   . Cystoscopy with stent placement Left 11/13/2014    Procedure: CYSTOSCOPY, left retrograde pyelogram WITH left ureteral STENT PLACEMENT;  Surgeon: Cleon Gustin, MD;  Location: [facility] ORS;  Service: Urology;  Laterality: Left;  . Cystoscopy/ureteroscopy/holmium laser/stent placement Left 11/26/2014    Procedure: CYSTOSCOPY/URETEROSCOPY/HOLMIUM LASER/STENT  PLACEMENT;  Surgeon: Hollice Espy, MD;  Location: [facility] ORS;  Service: Urology;  Laterality: Left;  . Cystoscopy w/ retrogrades Bilateral 11/26/2014    Procedure: CYSTOSCOPY WITH RETROGRADE PYELOGRAM;  Surgeon: Hollice Espy, MD;  Location: [facility] OR[facility];  Service: Urology;  Laterality: Bilateral;    Home Medications:    Medication List       This list is accurate as of: 12/25/14  4:53 PM.  Always use your most recent med list.               citalopram 10 MG tablet  Commonly known as:  CELEXA  Take 10 mg by mouth daily.     meloxicam 7.5 MG tablet  Commonly known as:  MOBIC  TAKE 1 TABLET (7.5 MG TOTAL) BY MOUTH ONCE DAILY.     omeprazole 40 MG capsule  Commonly known as:  PRILOSEC  Take 40 mg by mouth daily.        Allergies:  Allergies  Allergen Reactions  . 2,4-D Dimethylamine (Amisol) Shortness Of Breath  . Sulfa Antibiotics Itching and Shortness Of Breath  . Penicillin G Other (See Comments)    Other reaction(s): Unknown Has patient had a PCN reaction causing immediate rash, facial/tongue/throat swelling, SOB or lightheadedness with hypotension: Yes pt not sure Has patient had a PCN reaction causing severe rash involving mucus membranes or skin necrosis: Yes pt not sure Has patient had a PCN reaction that required hospitalization Yes pt not sure Has patient had a PCN reaction occurring within  the last 10 years: Yes pt not sure If all   . Omnicef [Cefdinir] Itching and Palpitations    Family History: Family History  Problem Relation Age of Onset  . Prostate cancer Neg Hx   . Kidney cancer Neg Hx   . Bladder Cancer Neg Hx     Social History:  reports that she has been smoking Cigarettes.  She has a 35 pack-year smoking history. She does not have any smokeless tobacco history on file. She reports that she drinks alcohol. She reports that she does not use illicit drugs.  ROS: UROLOGY Frequent Urination?: No Hard to postpone urination?: No Burning/pain with  urination?: No Get up at night to urinate?: No Leakage of urine?: No Urine stream starts and stops?: No Trouble starting stream?: No Do you have to strain to urinate?: No Blood in urine?: No Urinary tract infection?: No Sexually transmitted disease?: No Injury to kidneys or bladder?: No Painful intercourse?: No Weak stream?: No Currently pregnant?: No Vaginal bleeding?: No Last menstrual period?: n  Gastrointestinal Nausea?: No Vomiting?: No Indigestion/heartburn?: Yes Diarrhea?: No Constipation?: No  Constitutional Fever: No Night sweats?: No Weight loss?: No Fatigue?: No  Skin Skin rash/lesions?: No Itching?: No  Eyes Blurred vision?: No Double vision?: No  Ears/Nose/Throat Sore throat?: No Sinus problems?: No  Hematologic/Lymphatic Swollen glands?: No Easy bruising?: No  Cardiovascular Leg swelling?: No Chest pain?: No  Respiratory Cough?: No Shortness of breath?: No  Endocrine Excessive thirst?: No  Musculoskeletal Back pain?: No Joint pain?: No  Neurological Headaches?: No Dizziness?: No  Psychologic Depression?: No Anxiety?: No  Physical Exam: BP 139/79 mmHg  Pulse 94  Ht 5\' 8"  (1.727 m)  Wt 186 lb 12.8 oz (84.732 kg)  BMI 28.41 kg/m2  Constitutional:  Alert and oriented, No acute distress. HEENT: [place] AT, moist mucus membranes.  Trachea midline, no masses. Cardiovascular: No clubbing, cyanosis, or edema. Respiratory: Normal respiratory effort, no increased work of breathing. GI: Abdomen is soft, nontender, nondistended, no abdominal masses GU: No CVA tenderness. Skin: No rashes, bruises or suspicious lesions. Neurologic: Grossly intact, no focal deficits, moving all 4 extremities. Psychiatric: Normal mood and affect.  Laboratory Data: Lab Results  Component Value Date   WBC 12.2* 11/28/2014   HGB 14.2 11/28/2014   HCT 42.4 11/28/2014   MCV 91.4 11/28/2014   PLT 298 11/28/2014    Lab Results  Component Value Date    CREATININE 0.74 11/28/2014    Urinalysis UA today with trace blood and 1+ protein tip, otherwise negative. Microscopic exam shows no red blood cells or white blood cells. There are a few amorphous crystals and the remainder of this exam is negative.  Pertinent Imaging: CLINICAL DATA: Left ureteral stone.  EXAM: RENAL / URINARY TRACT ULTRASOUND COMPLETE  COMPARISON: CT 11/28/2014.  FINDINGS: Right Kidney:  Length: 11.1 cm. Echogenicity within normal limits. No mass or hydronephrosis visualized.  Left Kidney:  Length: 11.7 cm. Echogenicity within normal limits. No mass. Interim resolution of left hydronephrosis. 2.1 cm simple cyst left upper renal pole. Tiny 4 mm nonobstructing left lower pole calculus.  Bladder:  Appears normal for degree of bladder distention.  IMPRESSION: 1. Interim resolution of left-sided hydronephrosis.  2. 2.1 cm simple cyst left upper renal pole. 4 mm nonobstructing calculus left lower pole.   Electronically Signed  By: Marcello Moores Register  On: 12/24/2014 15:46  Assessment & Plan:    1. Left ureteral stone Status post left ureteroscopic laser lithotripsy for left-sided kidney stones.  Follow-up  imaging is negative for hydronephrosis.   We discussed general stone prevention techniques including drinking plenty water with goal of producing 2.5 L urine daily, increased citric acid intake, avoidance of high oxalate containing foods, and decreased salt intake.  Information about dietary recommendations given today.   Urinalysis, Complete   Return in about 1 year (around 12/25/2015) for KUB.  Hollice Espy, MD  [facility] [address], [place], [place] [address] [phone]

## 2015-01-27 ENCOUNTER — Other Ambulatory Visit: Payer: Self-pay | Admitting: Internal Medicine

## 2015-01-27 DIAGNOSIS — Z1239 Encounter for other screening for malignant neoplasm of breast: Secondary | ICD-10-CM

## 2015-02-07 ENCOUNTER — Ambulatory Visit
Admission: RE | Admit: 2015-02-07 | Discharge: 2015-02-07 | Disposition: A | Discharge status: Home / Self Care | Payer: BLUE CROSS/BLUE SHIELD | Source: Ambulatory Visit | Attending: Internal Medicine | Admitting: Internal Medicine

## 2015-02-07 DIAGNOSIS — Z1239 Encounter for other screening for malignant neoplasm of breast: Secondary | ICD-10-CM

## 2015-02-07 DIAGNOSIS — Z1231 Encounter for screening mammogram for malignant neoplasm of breast: Secondary | ICD-10-CM | POA: Insufficient documentation

## 2015-07-11 DIAGNOSIS — M47816 Spondylosis without myelopathy or radiculopathy, lumbar region: Secondary | ICD-10-CM | POA: Insufficient documentation

## 2015-07-11 DIAGNOSIS — I83813 Varicose veins of bilateral lower extremities with pain: Secondary | ICD-10-CM | POA: Insufficient documentation

## 2015-07-11 DIAGNOSIS — J302 Other seasonal allergic rhinitis: Secondary | ICD-10-CM | POA: Insufficient documentation

## 2015-07-11 DIAGNOSIS — J309 Allergic rhinitis, unspecified: Secondary | ICD-10-CM | POA: Insufficient documentation

## 2015-12-23 ENCOUNTER — Ambulatory Visit: Payer: Managed Care, Other (non HMO) | Admitting: Urology

## 2015-12-27 ENCOUNTER — Emergency Department
Admission: EM | Admit: 2015-12-27 | Discharge: 2015-12-27 | Disposition: A | Discharge status: Home / Self Care | Payer: BLUE CROSS/BLUE SHIELD | Attending: Emergency Medicine | Admitting: Emergency Medicine

## 2015-12-27 ENCOUNTER — Emergency Department: Payer: BLUE CROSS/BLUE SHIELD

## 2015-12-27 ENCOUNTER — Encounter: Payer: Self-pay | Admitting: Emergency Medicine

## 2015-12-27 DIAGNOSIS — R112 Nausea with vomiting, unspecified: Secondary | ICD-10-CM

## 2015-12-27 DIAGNOSIS — R197 Diarrhea, unspecified: Secondary | ICD-10-CM | POA: Insufficient documentation

## 2015-12-27 DIAGNOSIS — F17210 Nicotine dependence, cigarettes, uncomplicated: Secondary | ICD-10-CM | POA: Insufficient documentation

## 2015-12-27 DIAGNOSIS — R1011 Right upper quadrant pain: Secondary | ICD-10-CM | POA: Diagnosis not present

## 2015-12-27 LAB — COMPREHENSIVE METABOLIC PANEL
ALK PHOS: 89 U/L (ref 38–126)
ALT: 26 U/L (ref 14–54)
AST: 31 U/L (ref 15–41)
Albumin: 4.1 g/dL (ref 3.5–5.0)
Anion gap: 8 (ref 5–15)
BUN: 17 mg/dL (ref 6–20)
CALCIUM: 9.5 mg/dL (ref 8.9–10.3)
CHLORIDE: 108 mmol/L (ref 101–111)
CO2: 24 mmol/L (ref 22–32)
CREATININE: 0.61 mg/dL (ref 0.44–1.00)
Glucose, Bld: 114 mg/dL — ABNORMAL HIGH (ref 65–99)
Potassium: 3.5 mmol/L (ref 3.5–5.1)
Sodium: 140 mmol/L (ref 135–145)
Total Bilirubin: 0.2 mg/dL — ABNORMAL LOW (ref 0.3–1.2)
Total Protein: 7 g/dL (ref 6.5–8.1)

## 2015-12-27 LAB — URINALYSIS, COMPLETE (UACMP) WITH MICROSCOPIC
Bilirubin Urine: NEGATIVE
Glucose, UA: NEGATIVE mg/dL
Ketones, ur: NEGATIVE mg/dL
Leukocytes, UA: NEGATIVE
Nitrite: NEGATIVE
PH: 5 (ref 5.0–8.0)
Protein, ur: NEGATIVE mg/dL
Specific Gravity, Urine: 1.023 (ref 1.005–1.030)

## 2015-12-27 LAB — CBC
HCT: 40.9 % (ref 35.0–47.0)
Hemoglobin: 14.4 g/dL (ref 12.0–16.0)
MCH: 31.7 pg (ref 26.0–34.0)
MCHC: 35.1 g/dL (ref 32.0–36.0)
MCV: 90.1 fL (ref 80.0–100.0)
PLATELETS: 257 10*3/uL (ref 150–440)
RBC: 4.54 MIL/uL (ref 3.80–5.20)
RDW: 13.1 % (ref 11.5–14.5)
WBC: 4.4 10*3/uL (ref 3.6–11.0)

## 2015-12-27 LAB — LIPASE, BLOOD: Lipase: 19 U/L (ref 11–51)

## 2015-12-27 LAB — TROPONIN I: Troponin I: <0.03 ng/mL (ref <0.03)

## 2015-12-27 MED ORDER — HYDROCODONE-ACETAMINOPHEN 5-325 MG PO TABS: 1.0000 | ORAL_TABLET | Freq: Four times a day (QID) | ORAL | 0 refills | Status: DC | PRN

## 2015-12-27 MED ORDER — ONDANSETRON HCL 4 MG PO TABS: 4.0000 mg | ORAL_TABLET | Freq: Three times a day (TID) | ORAL | 0 refills | Status: DC | PRN

## 2015-12-27 MED ORDER — GI COCKTAIL ~~LOC~~
30.0000 mL | Freq: Once | ORAL | Status: AC
  Administered 2015-12-27: 30 mL via ORAL
  Filled 2015-12-27: qty 30

## 2015-12-27 NOTE — ED Provider Notes (Signed)
[facility] Emergency Department Provider Note ____________________________________________   I have reviewed the triage vital signs and the triage nursing note.  HISTORY  Chief Complaint Abdominal Pain   Historian Patient  HPI Brianna Huff is a 50 y.o. female presents today with at least a week of epigastric burning which is somewhat intermittent and made worse by food including liquids, and intermittent diarrhea for the past 4 or 5 days that had a small speck of blood in it as well. She's had abdominal pains described as cramping is mostly upper epigastrium and points to both sides of her upper abdomen. Denies back pain or dysuria or hematuria.  Reports a history of indigestion and GERD and takes PPI daily and added Zantac recently with no additional improvement.  States that she has seen GI in the past, but it was years ago. She is a primary care doctor which is Dr. Glendon Axe, next appointment in January.    Past Medical History:  Diagnosis Date  . Anxiety and depression   . Cyst of left kidney   . GERD (gastroesophageal reflux disease)   . Gross hematuria   . Internal hemorrhoids   . Kidney stones   . Low back pain   . Lower abdominal pain   . Monilial vaginitis   . Nasal congestion   . Shortness of breath dyspnea   . Vaginitis     Patient Active Problem List   Diagnosis Date Noted  . Left ureteral stone 11/16/2014  . Gross hematuria 11/05/2014  . Blood in the urine 10/27/2014  . Frank hematuria 10/25/2014  . Acute vaginitis 10/01/2014  . Acute back pain with sciatica 09/25/2014  . Hemorrhoids, internal 09/25/2014  . Anxiety and depression 09/25/2014  . Candida vaginitis 09/25/2014  . Cigarette smoker 02/20/2014    Past Surgical History:  Procedure Laterality Date  . ABDOMINAL HYSTERECTOMY     still has ovaries   . CYSTOSCOPY W/ RETROGRADES Bilateral 11/26/2014   Procedure: CYSTOSCOPY WITH RETROGRADE PYELOGRAM;  Surgeon:  Hollice Espy, MD;  Location: [facility] ORS;  Service: Urology;  Laterality: Bilateral;  . CYSTOSCOPY WITH STENT PLACEMENT Left 11/13/2014   Procedure: CYSTOSCOPY, left retrograde pyelogram WITH left ureteral STENT PLACEMENT;  Surgeon: Cleon Gustin, MD;  Location: [facility] ORS;  Service: Urology;  Laterality: Left;  . CYSTOSCOPY/URETEROSCOPY/HOLMIUM LASER/STENT PLACEMENT Left 11/26/2014   Procedure: CYSTOSCOPY/URETEROSCOPY/HOLMIUM LASER/STENT PLACEMENT;  Surgeon: Hollice Espy, MD;  Location: [facility] ORS;  Service: Urology;  Laterality: Left;  . NASAL SINUS SURGERY    . TUBAL LIGATION      Prior to Admission medications   Medication Sig Start Date End Date Taking? Authorizing Provider  citalopram (CELEXA) 10 MG tablet Take 10 mg by mouth daily.    Historical Provider, MD  HYDROcodone-acetaminophen (NORCO/VICODIN) 5-325 MG tablet Take 1 tablet by mouth every 6 (six) hours as needed for moderate pain. 12/27/15   Lisa Roca, MD  meloxicam (MOBIC) 7.5 MG tablet TAKE 1 TABLET (7.5 MG TOTAL) BY MOUTH ONCE DAILY. 11/29/14   Historical Provider, MD  omeprazole (PRILOSEC) 40 MG capsule Take 40 mg by mouth daily.    Historical Provider, MD  ondansetron (ZOFRAN) 4 MG tablet Take 1 tablet (4 mg total) by mouth every 8 (eight) hours as needed for nausea or vomiting. 12/27/15   Lisa Roca, MD      Family History  Problem Relation Age of Onset  . Breast cancer Mother   . Prostate cancer Neg Hx   . Kidney cancer  Neg Hx   . Bladder Cancer Neg Hx     Social History Social History  Substance Use Topics  . Smoking status: Current Every Day Smoker    Packs/day: 1.00    Years: 35.00    Types: Cigarettes  . Smokeless tobacco: Not on file  . Alcohol use 2.4 oz/week    4 Standard drinks or equivalent per week    Review of Systems  Constitutional: Negative for fever. Eyes: Negative for visual changes. ENT: Negative for sore throat. Cardiovascular: Negative for chest pain. Respiratory: Negative for  shortness of breath. Gastrointestinal: As per history of present illness. Genitourinary: Negative for dysuria. Musculoskeletal: Negative for back pain. Skin: Negative for rash. Neurological: Negative for headache. 10 point Review of Systems otherwise negative ____________________________________________   PHYSICAL EXAM:  VITAL SIGNS: ED Triage Vitals  Enc Vitals Group     BP 12/27/15 0815 121/70     Pulse Rate 12/27/15 0815 94     Resp 12/27/15 0815 18     Temp 12/27/15 0815 98.1 F (36.7 C)     Temp Source 12/27/15 0815 Oral     SpO2 12/27/15 0815 96 %     Weight 12/27/15 0816 192 lb (87.1 kg)     Height 12/27/15 0816 5\' 8"  (1.727 m)     Head Circumference --      Peak Flow --      Pain Score 12/27/15 0816 7     Pain Loc --      Pain Edu? --      Excl. in [place]? --      Constitutional: Alert and oriented. Well appearing and in no distress. HEENT   Head: Normocephalic and atraumatic.      Eyes: Conjunctivae are normal. PERRL. Normal extraocular movements.      Ears:         Nose: No congestion/rhinnorhea.   Mouth/Throat: Mucous membranes are moist.   Neck: No stridor. Cardiovascular/Chest: Normal rate, regular rhythm.  No murmurs, rubs, or gallops. Respiratory: Normal respiratory effort without tachypnea nor retractions. Breath sounds are clear and equal bilaterally. No wheezes/rales/rhonchi. Gastrointestinal: Soft. No distention, no guarding, no rebound. Moderate tenderness in the right upper quadrant. Moderate tenderness in the epigastrium. Mild tenderness left upper quadrant and lower abdomen to palpation.  Genitourinary/rectal: Nontender, nonthrombosed hemorrhoids.  Small brown stool, heme negative Musculoskeletal: Nontender with normal range of motion in all extremities. No joint effusions.  No lower extremity tenderness.  No edema. Neurologic:  Normal speech and language. No gross or focal neurologic deficits are appreciated. Skin:  Skin is warm, dry and  intact. No rash noted. Psychiatric: Mood and affect are normal. Speech and behavior are normal. Patient exhibits appropriate insight and judgment.   ____________________________________________  LABS (pertinent positives/negatives)  Labs Reviewed  COMPREHENSIVE METABOLIC PANEL - Abnormal; Notable for the following:       Result Value   Glucose, Bld 114 (*)    Total Bilirubin 0.2 (*)    All other components within normal limits  URINALYSIS, COMPLETE (UACMP) WITH MICROSCOPIC - Abnormal; Notable for the following:    Color, Urine YELLOW (*)    APPearance HAZY (*)    Hgb urine dipstick SMALL (*)    Bacteria, UA RARE (*)    Squamous Epithelial / LPF 6-30 (*)    All other components within normal limits  URINE CULTURE  LIPASE, BLOOD  CBC  TROPONIN I    ____________________________________________    EKG I, Lisa Roca, MD, the  attending physician have personally viewed and interpreted all ECGs.  84 bpm. Normal sinus rhythm. Narrow QRS. Normal axis. Normal ST and T-wave ____________________________________________  RADIOLOGY All Xrays were viewed by me. Imaging interpreted by Radiologist.  Ultrasound right upper quadrant:  IMPRESSION: No acute hepatobiliary findings. __________________________________________  PROCEDURES  Procedure(s) performed: None  Critical Care performed: None  ____________________________________________   ED COURSE / ASSESSMENT AND PLAN  Pertinent labs & imaging results that were available during my care of the patient were reviewed by me and considered in my medical decision making (see chart for details).  Ms. Pecina describes epigastric burning somewhat more so in the right upper quadrant and is concerned about stomach ulcers versus gallbladder problems. She also reports diarrhea intermittently for the past several days.  In the community there is going around a significant GI illness, possible diarrhea is related to that.   [place]  reassuring.  Labs reassuring.  Rectal /heme negative reassuring.  She reports ongoing pain, mildly improved.  I discussed with her that we could perform CT the abdomen and pelvis, and the diverticulitis would still be a consideration although again she doesn't have any real high-risk findings with no elevated white blood cell count, fever, focal abdominal pain, or black or bloody stools. We discussed the benefit of performing this now versus watch and wait. She states that she would just as soon watch and wait. We discussed return precautions. She is concerned about having a stomach ulcer, I did ask her to follow with a primary care doctor and a GI doctor.   CONSULTATIONS:   None   Patient / Family / Caregiver informed of clinical course, medical decision-making process, and agree with plan.   I discussed return precautions, follow-up instructions, and discharge instructions with patient and/or family.   ___________________________________________   FINAL CLINICAL IMPRESSION(S) / ED DIAGNOSES   Final diagnoses:  RUQ pain  Nausea vomiting and diarrhea              Note: This dictation was prepared with Dragon dictation. Any transcriptional errors that result from this process are unintentional    Lisa Roca, MD 12/27/15 [phone]

## 2015-12-27 NOTE — ED Notes (Signed)
Lab notified to add on urine culture 

## 2015-12-27 NOTE — Discharge Instructions (Signed)
You exam ane evaluation are reassuring in the emergency department today.  Return to the emergency department for any new or worsening condition including worsening abdominal pain, dizziness or passing out, fever, body aches, black or bloody stools, bloody vomiting, or any other symptoms concerning to you.  I recommend follow up with your primary doctor as well as with a GI specialist, Dr. Percell [place] office number is provided.

## 2015-12-27 NOTE — ED Triage Notes (Signed)
Pt c/o abdominal pain since Tuesday, states pain started midline epigastric pain and has since "come full circle and hurts on the upper R side". Pt states that diarrhea started on Tuesday and lasted approx 24 hrs, and then started again last night. Pt c/o constant nausea, denies any blood in her diarrhea, denies any vomiting at this time.

## 2015-12-27 NOTE — ED Notes (Signed)
Returned from U/S

## 2015-12-27 NOTE — ED Notes (Signed)
Pt informed she will need a urine sample next time she needs to use the bathroom.

## 2015-12-29 LAB — URINE CULTURE

## 2016-01-30 DIAGNOSIS — R7303 Prediabetes: Secondary | ICD-10-CM | POA: Insufficient documentation

## 2016-01-30 DIAGNOSIS — R1013 Epigastric pain: Secondary | ICD-10-CM | POA: Insufficient documentation

## 2016-01-30 DIAGNOSIS — K635 Polyp of colon: Secondary | ICD-10-CM | POA: Insufficient documentation

## 2016-02-17 IMAGING — CT CT RENAL STONE PROTOCOL
1 of 2 series · 14 of 32 positions shown, 18 images · non-contrast
Comparison: CT abdomen and pelvis November 11, 2014; renal
ultrasound November 17, 2014

CLINICAL DATA: Left flank pain.

EXAM:
CT ABDOMEN AND PELVIS WITHOUT CONTRAST
TECHNIQUE: Multidetector CT imaging of the abdomen and pelvis was performed
following the standard protocol without oral or intravenous contrast
material administration.

[Series 2: stone standard full · axial · 0.83mm/px · z∈[-1135,-695]mm · 14 of 98 slices shown, 18 images]
[im 5/98  soft-tissue]
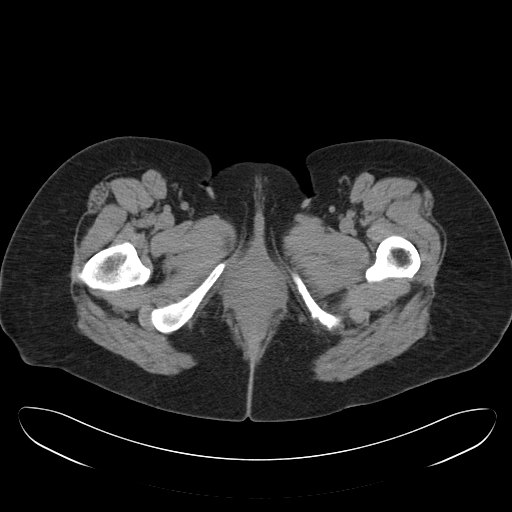
[im 5/98  bone]
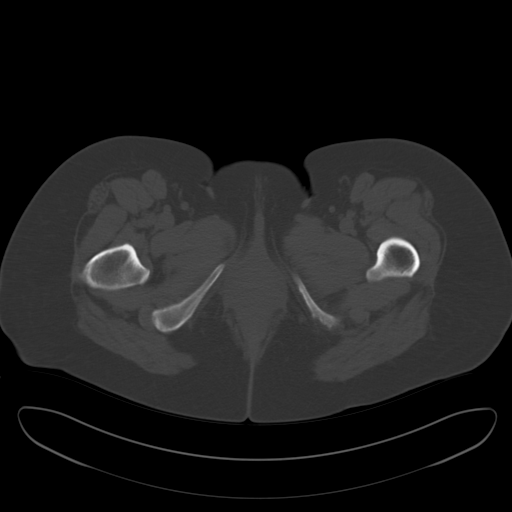
[im 13/98  soft-tissue]
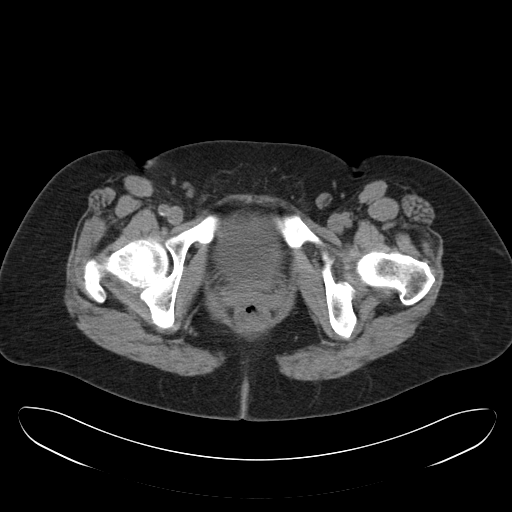
[im 21/98  soft-tissue]
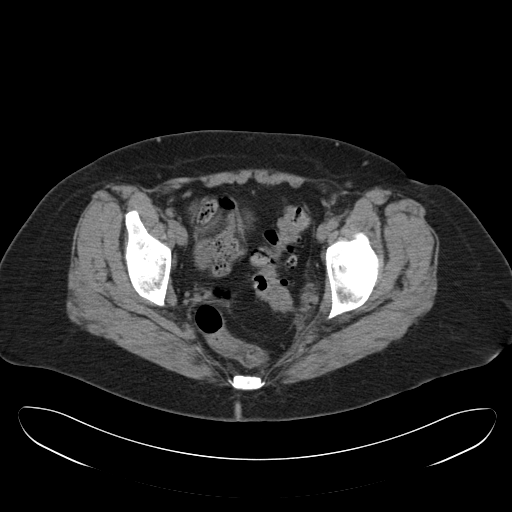
[im 29/98  soft-tissue]
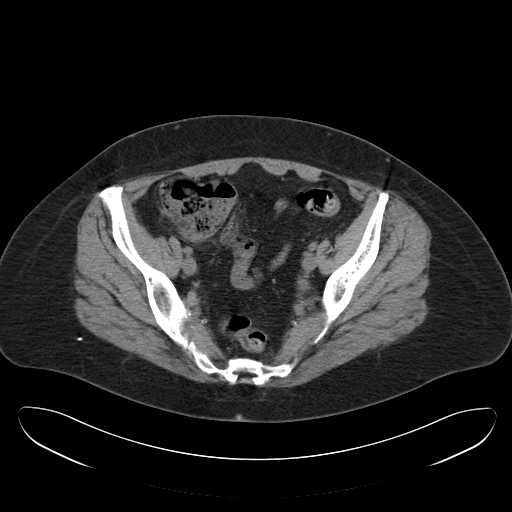
[im 37/98  soft-tissue]
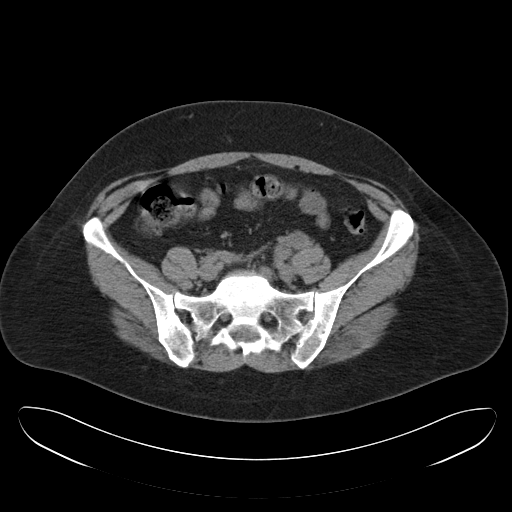
[im 45/98  soft-tissue]
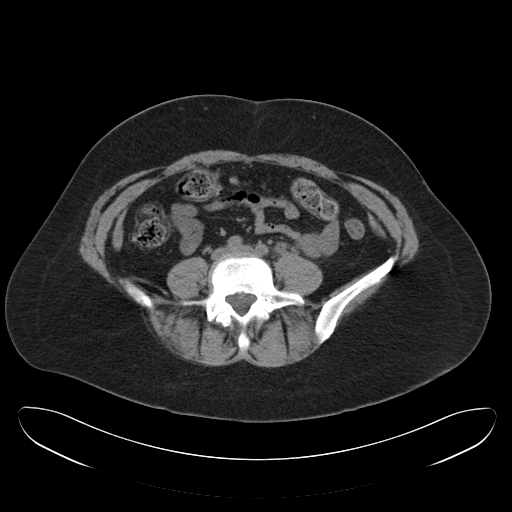
[im 53/98  soft-tissue]
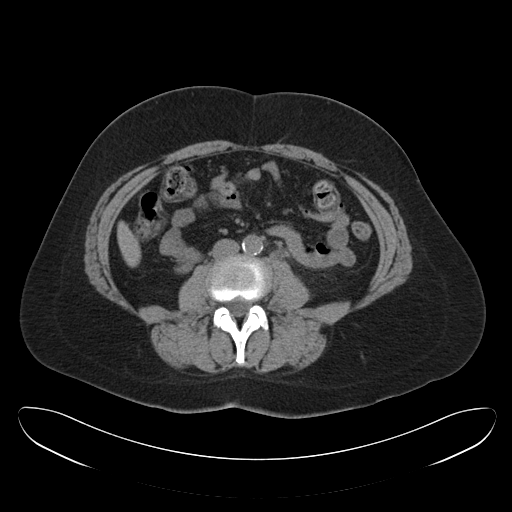
[im 61/98  soft-tissue]
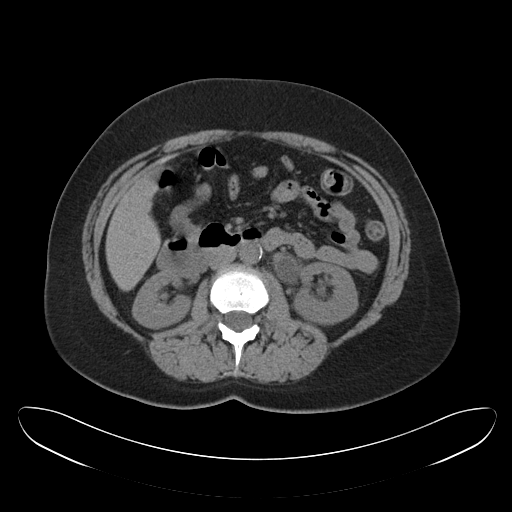
[im 69/98  soft-tissue]
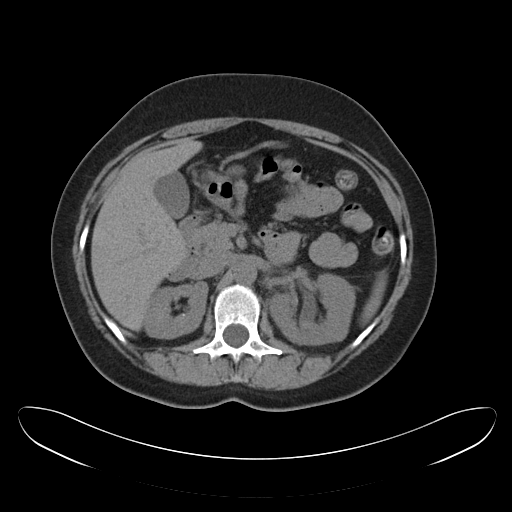
[im 69/98  bone]
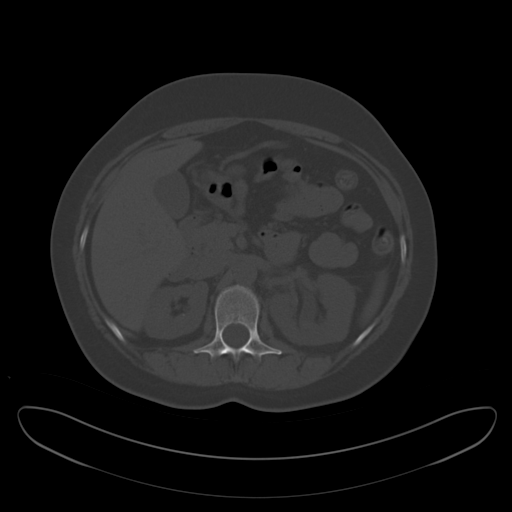
[im 77/98  soft-tissue]
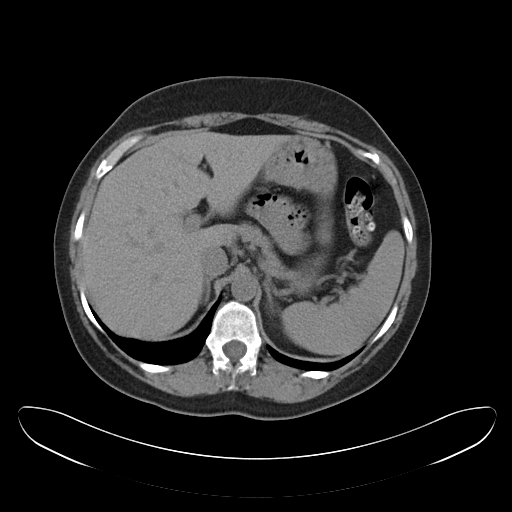
[im 81/98  lung]
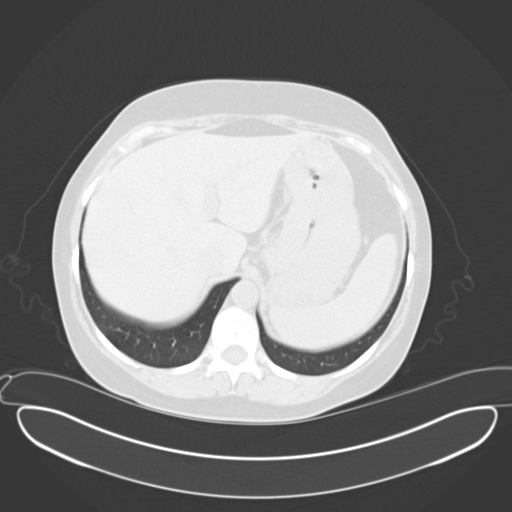
[im 85/98  soft-tissue]
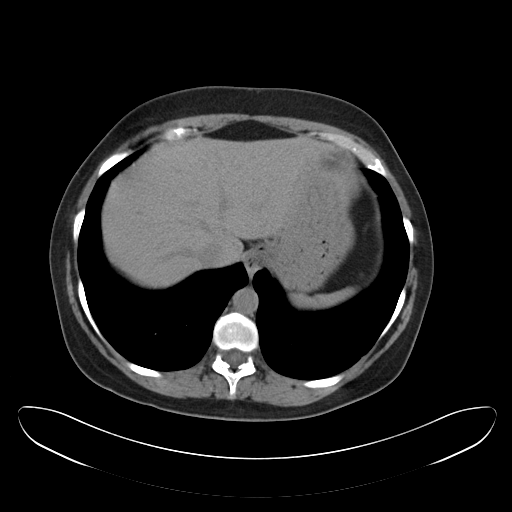
[im 85/98  lung]
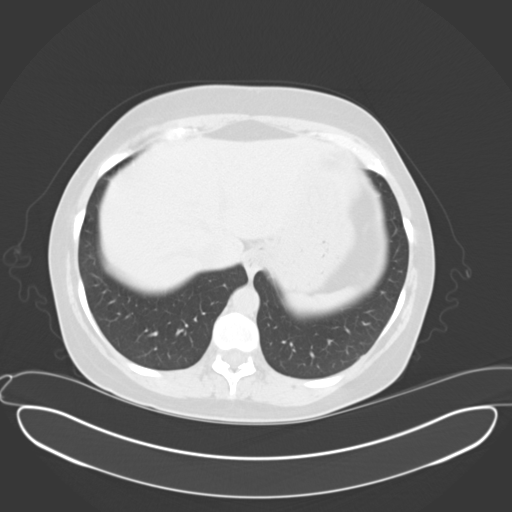
[im 89/98  lung]
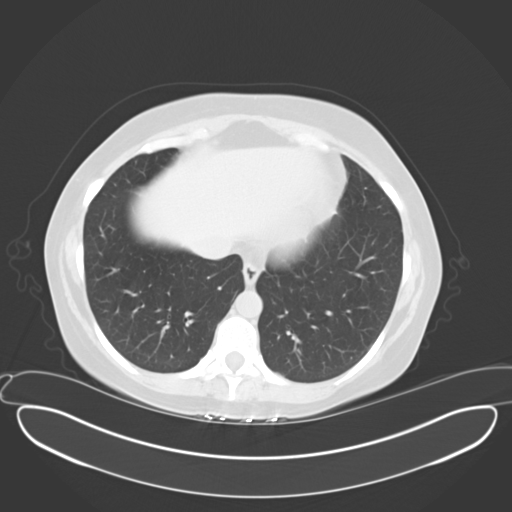
[im 93/98  soft-tissue]
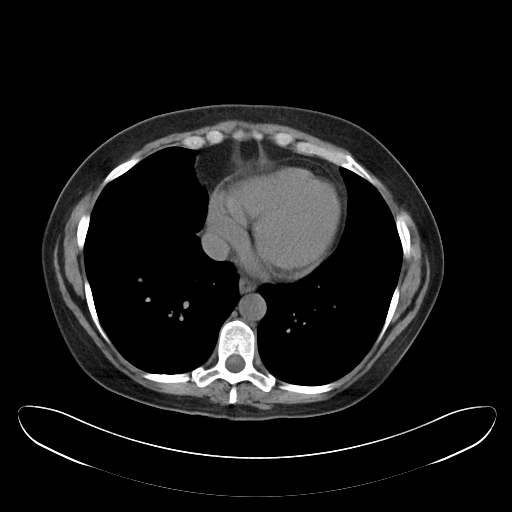
[im 93/98  lung]
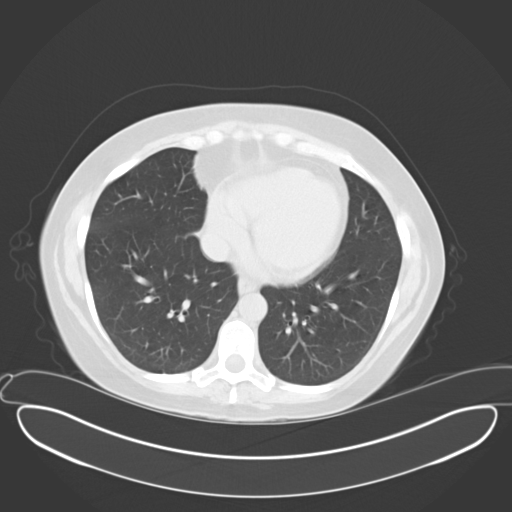

[14 of 32 positions shown; findings below may reference images not displayed]

FINDINGS: Lower chest: There is slight right base atelectasis. Lung bases are
otherwise clear.

Hepatobiliary: There is a tiny calcified granuloma in the dome of
the liver on the right. No other focal liver lesion is identified on
this noncontrast enhanced study. Gallbladder wall is not appreciably
thickened. There is no biliary duct dilatation.

Pancreas: No mass or inflammatory focus.

Spleen: No splenic lesions are identified.

Adrenals/Urinary Tract: Adrenals appear normal bilaterally. There is
a 1 mm nonobstructing calculus in the upper pole right kidney. There
is no right renal mass or hydronephrosis. There is no right-sided
ureteral calculus. On the left, there is a cyst arising medially
from the upper pole left kidney measuring 2.4 x 1.6 cm. There are
two tiny left lower pole renal calculi. There is moderate left-sided
hydronephrosis with moderate dilatation of an extrarenal pelvis on
the left. There is a 2 mm calculus in the distal left ureter at the
level of the acetabulum. Note that a larger calculus at the left
ureterovesical junction noted on prior study is no longer
appreciable. A previously noted left renal calculus is no longer
appreciable and is presumably now located in the distal left ureter.
No other ureteral calculi are identified. Urinary bladder is midline
with normal wall thickness.

Stomach/Bowel: There are scattered colonic diverticula, most notably
in the sigmoid region. No diverticulitis. No bowel wall or
mesenteric thickening. No bowel obstruction. No free air or portal
venous air.

Vascular/Lymphatic: There is atherosclerotic calcification in the
aorta and iliac arteries. No abdominal aortic aneurysm appreciable.
There is no adenopathy in the abdomen or pelvis.

Reproductive: Uterus is absent. There is no pelvic mass or pelvic
fluid collection.

Other: No abscess or ascites is noted in the abdomen or pelvis.
Appendix is not seen. There is no periaortic region inflammation.

Musculoskeletal: There are no blastic or lytic bone lesions. No
intramuscular or abdominal wall lesions.
IMPRESSION: 2 mm calculus distal left ureter with moderate left-sided
hydronephrosis in ureterectasis. Small nonobstructing calculi noted
in each kidney.

There are scattered colonic diverticula without diverticulitis. No
bowel obstruction. No abscess.

Uterus absent.

## 2016-07-30 DIAGNOSIS — J4540 Moderate persistent asthma, uncomplicated: Secondary | ICD-10-CM | POA: Insufficient documentation

## 2016-10-31 IMAGING — US US RENAL
1 series · 14 of 25 positions shown · non-contrast
Comparison: CT 11/28/2014.

CLINICAL DATA: Left ureteral stone.

EXAM:
RENAL / URINARY TRACT ULTRASOUND COMPLETE

[Series 1: us renal · 0.23mm/px · 14 of 39 slices shown]
[im 1/39]
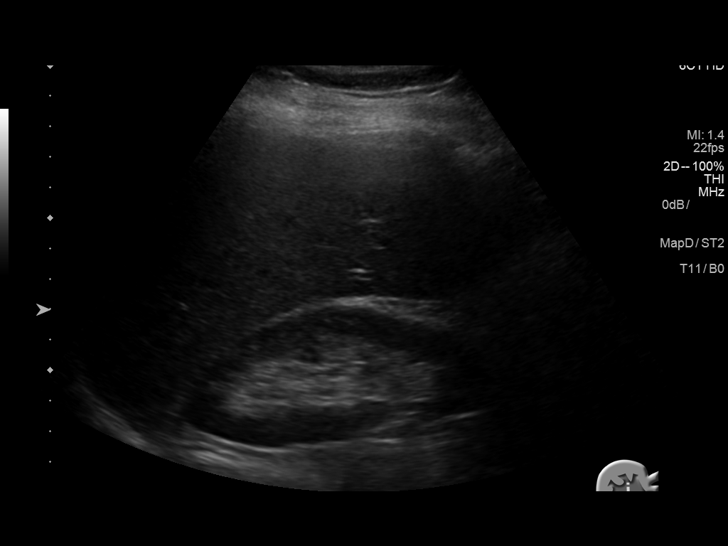
[im 4/39]
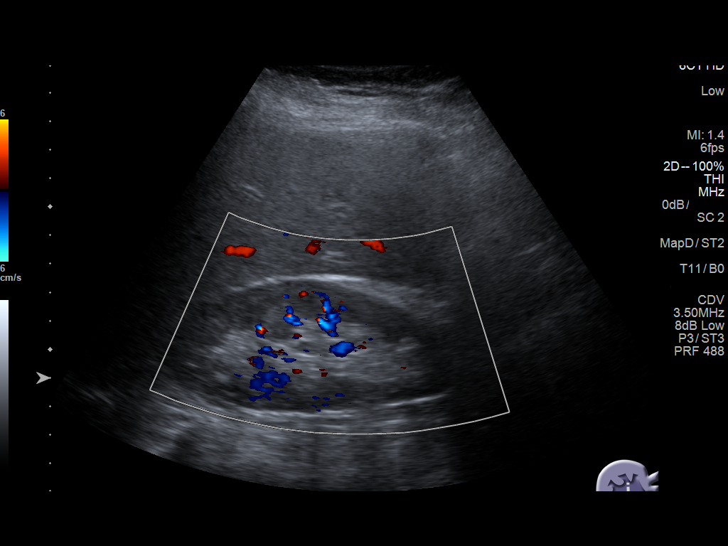
[im 7/39]
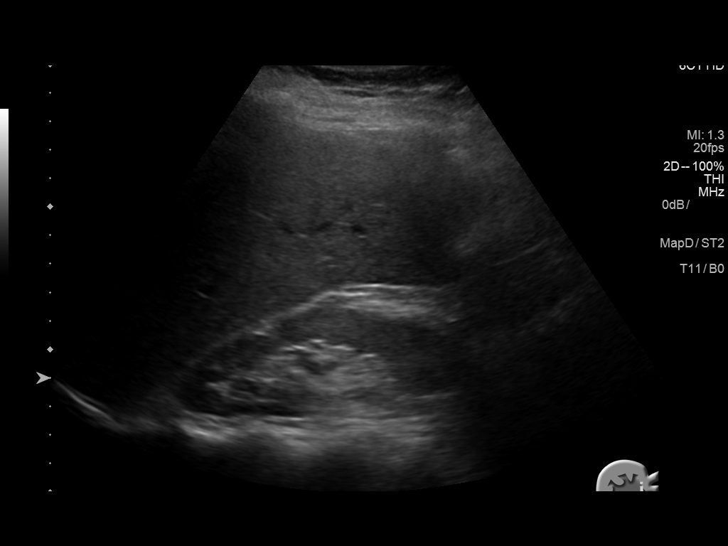
[im 10/39]
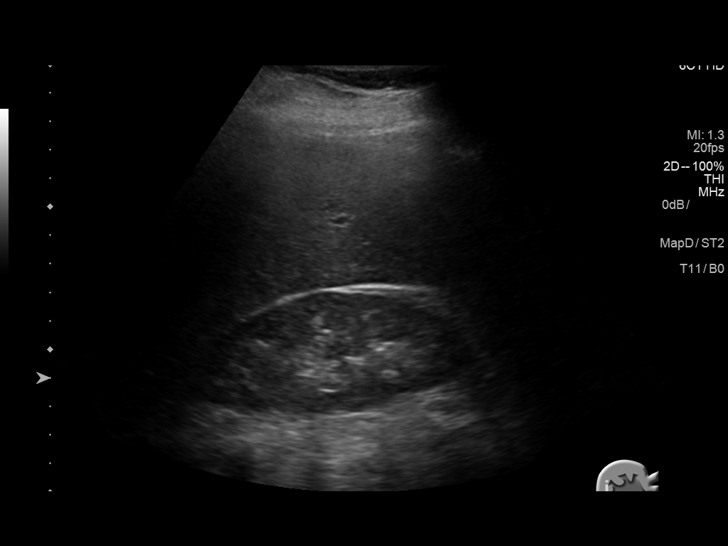
[im 13/39]
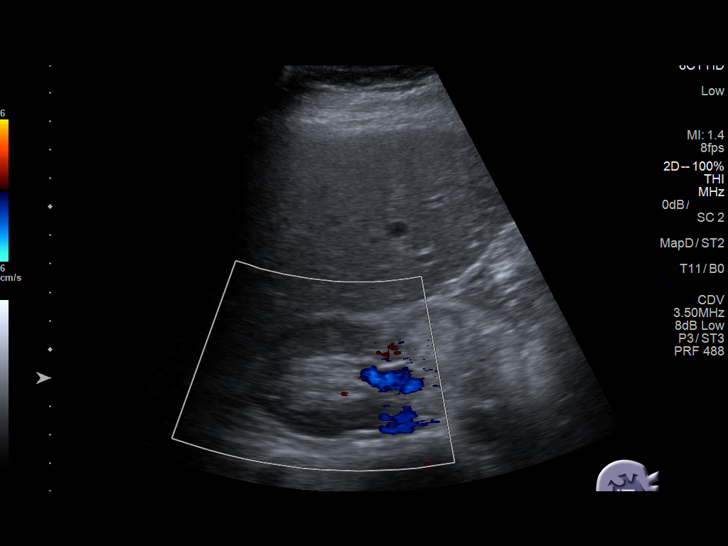
[im 15/39]
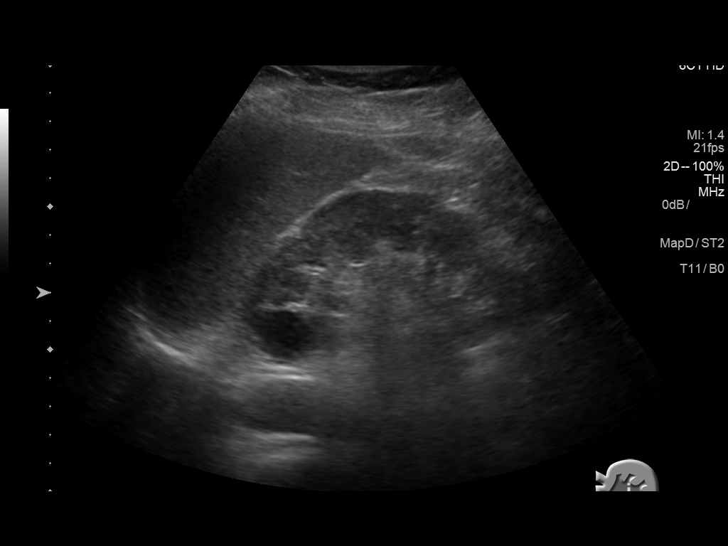
[im 18/39]
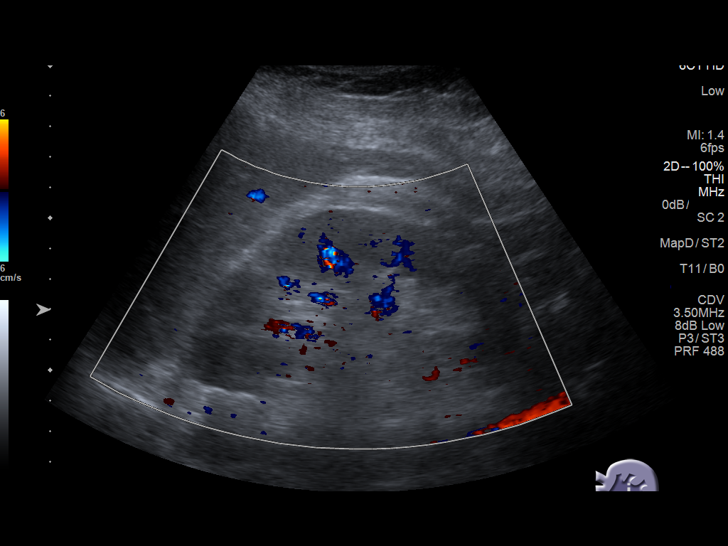
[im 21/39]
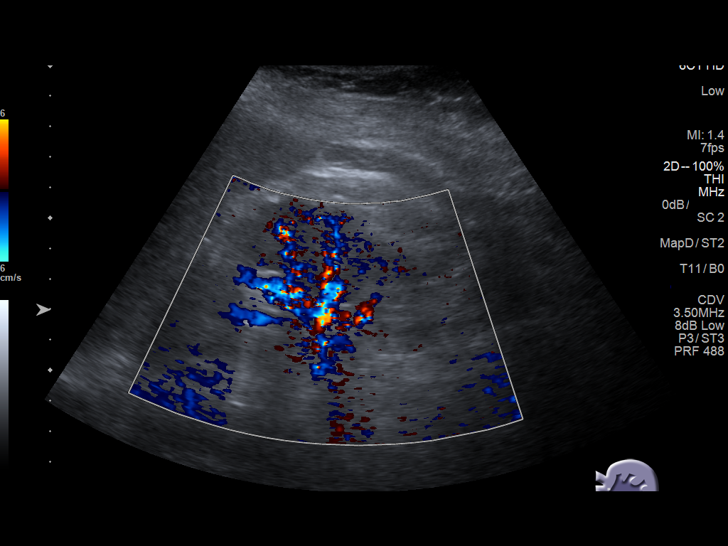
[im 24/39]
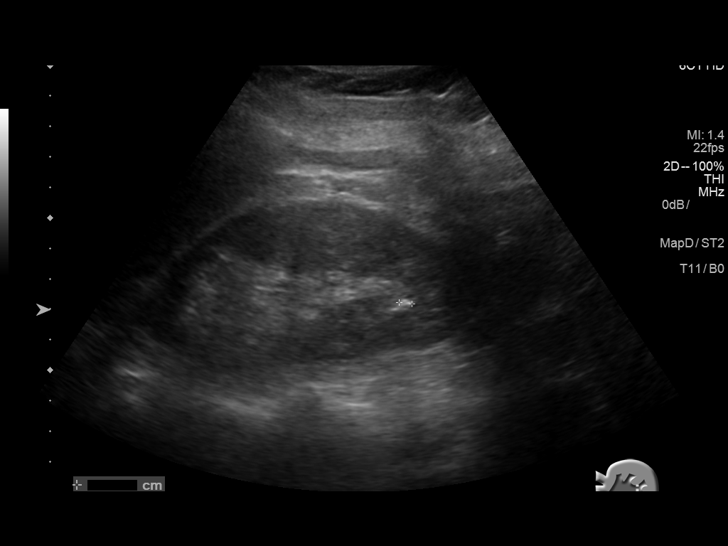
[im 26/39]
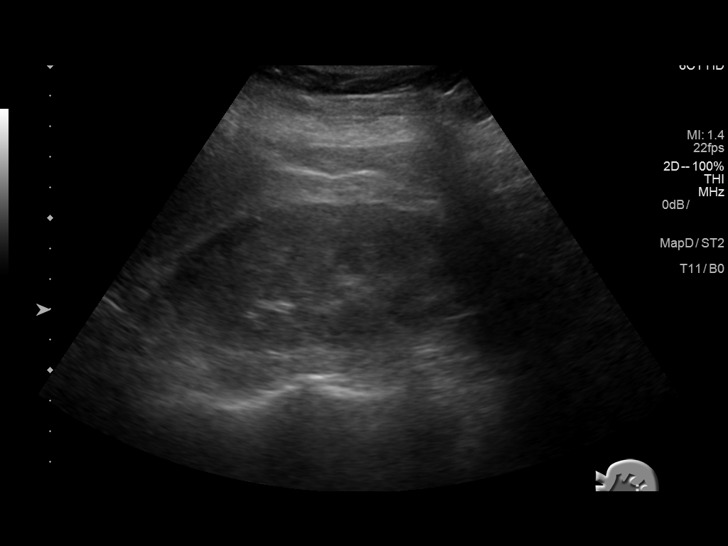
[im 29/39]
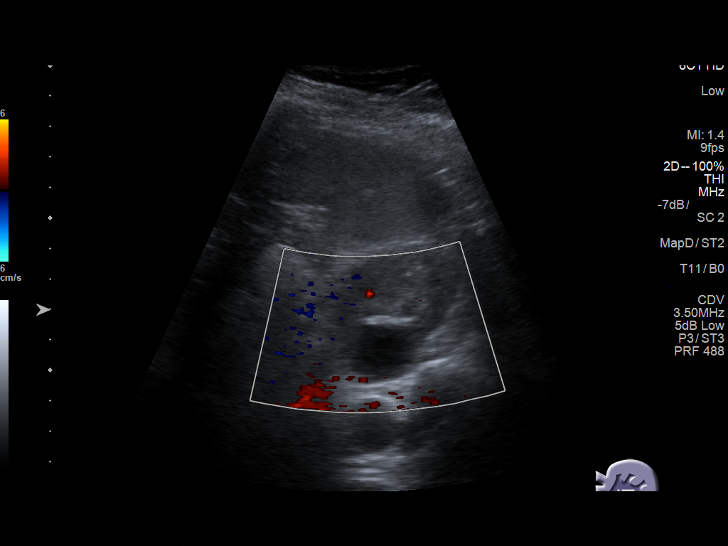
[im 32/39]
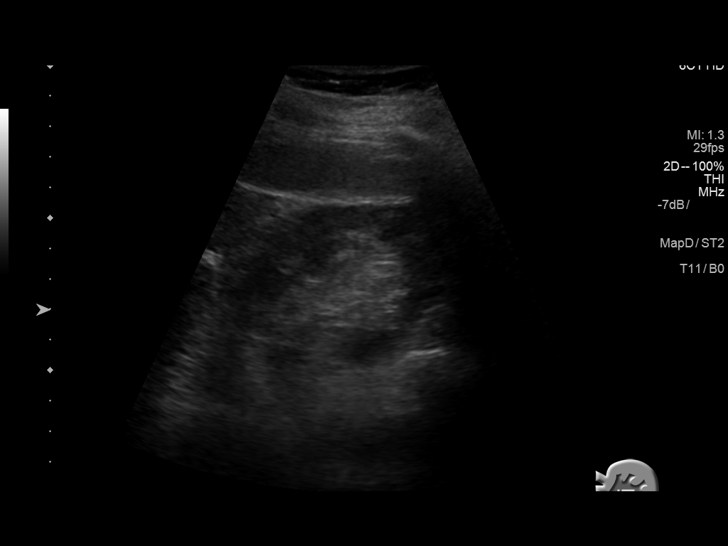
[im 35/39]
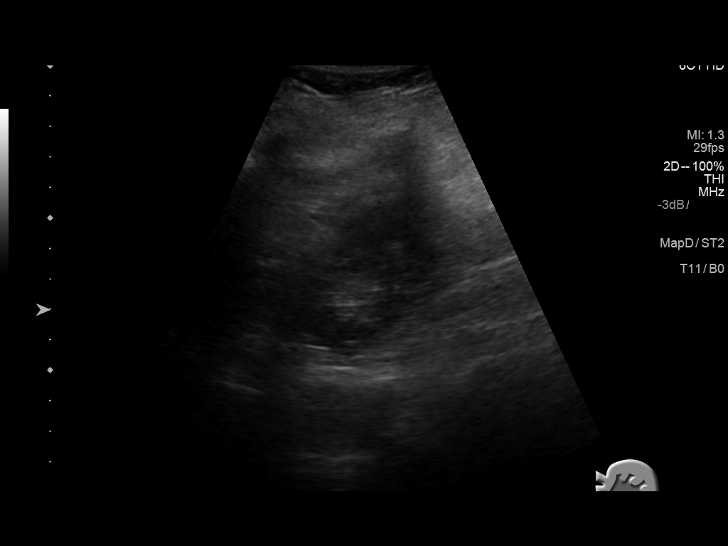
[im 39/39]
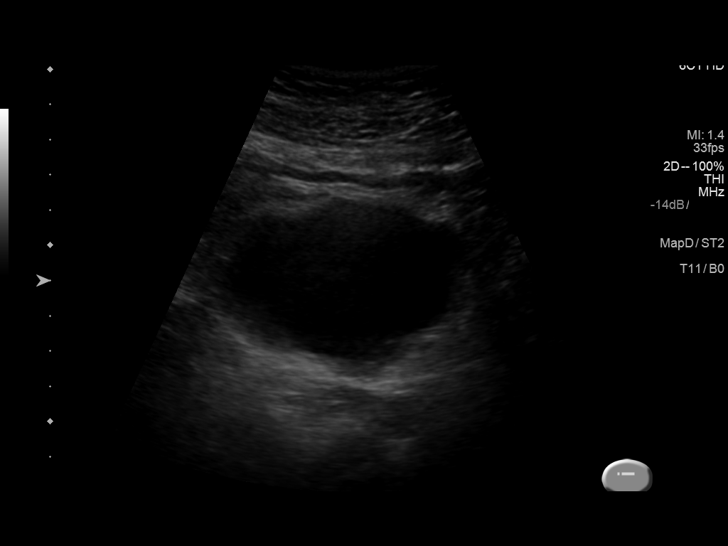

[14 of 25 positions shown; findings below may reference images not displayed]

FINDINGS: Right Kidney:

Length: 11.1 cm. Echogenicity within normal limits. No mass or
hydronephrosis visualized.

Left Kidney:

Length: 11.7 cm. Echogenicity within normal limits. No mass. Interim
resolution of left hydronephrosis. 2.1 cm simple cyst left upper
renal pole. Tiny 4 mm nonobstructing left lower pole calculus.

Bladder:

Appears normal for degree of bladder distention.
IMPRESSION: 1.  Interim resolution of left-sided hydronephrosis.

2. 2.1 cm simple cyst left upper renal pole. 4 mm nonobstructing
calculus left lower pole.

## 2018-09-29 ENCOUNTER — Other Ambulatory Visit: Payer: Self-pay | Admitting: Infectious Diseases

## 2018-09-29 DIAGNOSIS — Z1231 Encounter for screening mammogram for malignant neoplasm of breast: Secondary | ICD-10-CM

## 2020-09-18 ENCOUNTER — Ambulatory Visit
Admission: EM | Admit: 2020-09-18 | Discharge: 2020-09-18 | Disposition: A | Discharge status: Home / Self Care | Payer: Managed Care, Other (non HMO) | Attending: Emergency Medicine | Admitting: Emergency Medicine

## 2020-09-18 ENCOUNTER — Other Ambulatory Visit: Payer: Self-pay

## 2020-09-18 DIAGNOSIS — K610 Anal abscess: Secondary | ICD-10-CM | POA: Diagnosis not present

## 2020-09-18 MED ORDER — DOXYCYCLINE HYCLATE 100 MG PO CAPS: 100.0000 mg | ORAL_CAPSULE | Freq: Two times a day (BID) | ORAL | 0 refills | Status: DC

## 2020-09-18 NOTE — ED Provider Notes (Signed)
Roderic Palau    CSN: [id number] Arrival date & time: 09/18/20  1534      History   Chief Complaint Chief Complaint  Patient presents with   Abscess    HPI Brianna Huff is a 55 y.o. female.  Patient presents with an abscess on her buttock near her anus that she noticed yesterday.  She reports history of abscesses in her groin but no previous history of perirectal abscess.  She denies drainage from the wound, fever, chills, change in bowel pattern, or other symptoms.  No treatment attempted at home.  Her medical history includes current everyday smoker.  The history is provided by the patient and medical records.   Past Medical History:  Diagnosis Date   Anxiety and depression    Cyst of left kidney    GERD (gastroesophageal reflux disease)    Gross hematuria    Internal hemorrhoids    Kidney stones    Low back pain    Lower abdominal pain    Monilial vaginitis    Nasal congestion    Shortness of breath dyspnea    Vaginitis     Patient Active Problem List   Diagnosis Date Noted   Left ureteral stone 11/16/2014   Gross hematuria 11/05/2014   Blood in the urine 10/27/2014   Frank hematuria 10/25/2014   Acute vaginitis 10/01/2014   Acute back pain with sciatica 09/25/2014   Hemorrhoids, internal 09/25/2014   Anxiety and depression 09/25/2014   Candida vaginitis 09/25/2014   Cigarette smoker 02/20/2014    Past Surgical History:  Procedure Laterality Date   ABDOMINAL HYSTERECTOMY     still has ovaries    CYSTOSCOPY W/ RETROGRADES Bilateral 11/26/2014   Procedure: CYSTOSCOPY WITH RETROGRADE PYELOGRAM;  Surgeon: Hollice Espy, MD;  Location: [facility];  Service: Urology;  Laterality: Bilateral;   CYSTOSCOPY WITH STENT PLACEMENT Left 11/13/2014   Procedure: CYSTOSCOPY, left retrograde pyelogram WITH left ureteral STENT PLACEMENT;  Surgeon: Cleon Gustin, MD;  Location: [facility];  Service: Urology;  Laterality: Left;   CYSTOSCOPY/URETEROSCOPY/HOLMIUM  LASER/STENT PLACEMENT Left 11/26/2014   Procedure: CYSTOSCOPY/URETEROSCOPY/HOLMIUM LASER/STENT PLACEMENT;  Surgeon: Hollice Espy, MD;  Location: [facility];  Service: Urology;  Laterality: Left;   NASAL SINUS SURGERY     TUBAL LIGATION      OB History   No obstetric history on file.      Home Medications    Prior to Admission medications   Medication Sig Start Date End Date Taking? Authorizing Provider  citalopram (CELEXA) 10 MG tablet Take 10 mg by mouth daily.   Yes [provider]  doxycycline (VIBRAMYCIN) 100 MG capsule Take 1 capsule (100 mg total) by mouth 2 (two) times daily. 09/18/20  Yes Sharion Balloon, NP  omeprazole (PRILOSEC) 40 MG capsule Take 40 mg by mouth daily.   Yes [provider]  HYDROcodone-acetaminophen (NORCO/VICODIN) 5-325 MG tablet Take 1 tablet by mouth every 6 (six) hours as needed for moderate pain. 12/27/15   Lisa Roca, MD  meloxicam (MOBIC) 7.5 MG tablet TAKE 1 TABLET (7.5 MG TOTAL) BY MOUTH ONCE DAILY. 11/29/14   [provider]  ondansetron (ZOFRAN) 4 MG tablet Take 1 tablet (4 mg total) by mouth every 8 (eight) hours as needed for nausea or vomiting. 12/27/15   Lisa Roca, MD    Family History Family History  Problem Relation Age of Onset   Breast cancer Mother    Prostate cancer Neg Hx    Kidney cancer Neg  Hx    Bladder Cancer Neg Hx     Social History Social History   Tobacco Use   Smoking status: Every Day    Packs/day: 1.00    Years: 35.00    Pack years: 35.00    Types: Cigarettes   Smokeless tobacco: Never  Vaping Use   Vaping Use: Never used  Substance Use Topics   Alcohol use: Yes    Alcohol/week: 4.0 standard drinks    Types: 4 Standard drinks or equivalent per week   Drug use: No     Allergies   2,4-d dimethylamine (amisol); Sulfa antibiotics; Penicillin g; and Omnicef [cefdinir]   Review of Systems Review of Systems  Constitutional:  Negative for chills and fever.  Respiratory:   Negative for cough and shortness of breath.   Cardiovascular:  Negative for chest pain and palpitations.  Gastrointestinal:  Negative for abdominal pain, blood in stool, constipation, diarrhea and vomiting.  Skin:  Positive for wound. Negative for color change.  All other systems reviewed and are negative.   Physical Exam Triage Vital Signs ED Triage Vitals  Enc Vitals Group     BP 09/18/20 1548 136/87     Pulse Rate 09/18/20 1548 90     Resp 09/18/20 1548 18     Temp 09/18/20 1548 98.2 F (36.8 C)     Temp Source 09/18/20 1548 Oral     SpO2 09/18/20 1548 96 %     Weight --      Height --      Head Circumference --      Peak Flow --      Pain Score 09/18/20 1609 0     Pain Loc --      Pain Edu? --      Excl. in [place]? --    No data found.  Updated Vital Signs BP 136/87 (BP Location: Left Arm)   Pulse 90   Temp 98.2 F (36.8 C) (Oral)   Resp 18   SpO2 96%   Visual Acuity Right Eye Distance:   Left Eye Distance:   Bilateral Distance:    Right Eye Near:   Left Eye Near:    Bilateral Near:     Physical Exam Vitals and nursing note reviewed.  Constitutional:      General: She is not in acute distress.    Appearance: She is well-developed. She is not ill-appearing.  HENT:     Head: Normocephalic and atraumatic.     Mouth/Throat:     Mouth: Mucous membranes are moist.  Eyes:     Conjunctiva/sclera: Conjunctivae normal.  Cardiovascular:     Rate and Rhythm: Normal rate and regular rhythm.     Heart sounds: Normal heart sounds.  Pulmonary:     Effort: Pulmonary effort is normal. No respiratory distress.     Breath sounds: Normal breath sounds.  Abdominal:     Palpations: Abdomen is soft.     Tenderness: There is no abdominal tenderness.  Genitourinary:      Comments: Superficial 1 cm tender area of induration in gluteal fold beside anus; moderate amount of purulent drainage when pressure applied. Musculoskeletal:        General: Normal range of motion.      Cervical back: Neck supple.  Skin:    General: Skin is warm and dry.     Findings: Lesion present.  Neurological:     General: No focal deficit present.     Mental Status: She is  alert and oriented to person, place, and time.     Gait: Gait normal.  Psychiatric:        Mood and Affect: Mood normal.        Behavior: Behavior normal.     UC Treatments / Results  Labs (all labs ordered are listed, but only abnormal results are displayed) Labs Reviewed - No data to display  EKG   Radiology No results found.  Procedures Procedures (including critical care time)  Medications Ordered in UC Medications - No data to display  Initial Impression / Assessment and Plan / UC Course  I have reviewed the triage vital signs and the nursing notes.  Pertinent labs & imaging results that were available during my care of the patient were reviewed by me and considered in my medical decision making (see chart for details).  Perianal abscess.  Purulent drainage when pressure applied.  Patient is allergic to multiple antibiotics; treating today with doxycycline.  Discussed care of abscess.  Discussed need for follow-up of perirectal abscess; Instructed patient to follow-up with her PCP or general surgeon.  ED precautions discussed and patient agrees to plan of care.   Final Clinical Impressions(s) / UC Diagnoses   Final diagnoses:  Perianal abscess     Discharge Instructions      Take the doxycycline as directed.  Encourage drainage from the abscess.  Follow up with your primary care provider if your symptoms are not improving.         ED Prescriptions     Medication Sig Dispense Auth. Provider   doxycycline (VIBRAMYCIN) 100 MG capsule Take 1 capsule (100 mg total) by mouth 2 (two) times daily. 20 capsule Sharion Balloon, NP      PDMP not reviewed this encounter.   Sharion Balloon, NP 09/18/20 1650

## 2020-09-18 NOTE — ED Triage Notes (Signed)
Pt reports perianal abscess first noticed yesterday. Has had these numerous times in the past.  No fevers, unsure of drainage.  Is mildly TTP.

## 2020-09-18 NOTE — Discharge Instructions (Addendum)
Take the doxycycline as directed.  Encourage drainage from the abscess.  Follow up with your primary care provider if your symptoms are not improving.

## 2020-10-09 ENCOUNTER — Other Ambulatory Visit: Payer: Self-pay

## 2020-10-09 ENCOUNTER — Ambulatory Visit: Payer: Managed Care, Other (non HMO) | Admitting: Dermatology

## 2020-10-09 DIAGNOSIS — L219 Seborrheic dermatitis, unspecified: Secondary | ICD-10-CM

## 2020-10-09 DIAGNOSIS — L570 Actinic keratosis: Secondary | ICD-10-CM | POA: Diagnosis not present

## 2020-10-09 DIAGNOSIS — L820 Inflamed seborrheic keratosis: Secondary | ICD-10-CM | POA: Diagnosis not present

## 2020-10-09 DIAGNOSIS — L720 Epidermal cyst: Secondary | ICD-10-CM | POA: Diagnosis not present

## 2020-10-09 DIAGNOSIS — D1800 Hemangioma unspecified site: Secondary | ICD-10-CM | POA: Diagnosis not present

## 2020-10-09 DIAGNOSIS — L821 Other seborrheic keratosis: Secondary | ICD-10-CM

## 2020-10-09 DIAGNOSIS — Z808 Family history of malignant neoplasm of other organs or systems: Secondary | ICD-10-CM

## 2020-10-09 DIAGNOSIS — L738 Other specified follicular disorders: Secondary | ICD-10-CM

## 2020-10-09 MED ORDER — KETOCONAZOLE 2 % EX SHAM: 1.0000 "application " | MEDICATED_SHAMPOO | CUTANEOUS | 11 refills | Status: AC

## 2020-10-09 MED ORDER — TRIAMCINOLONE ACETONIDE 0.1 % EX LOTN: 1.0000 "application " | TOPICAL_LOTION | Freq: Two times a day (BID) | CUTANEOUS | 3 refills | Status: DC

## 2020-10-09 NOTE — Progress Notes (Signed)
New Patient Visit  Subjective  Brianna Huff is a 55 y.o. female who presents for the following: New Patient (Initial Visit) (Patient here today to reestablish care. She reports a cyst at front of neck, itchy scalp, tiny spot at left upper arm. Patient reports family history of skin cancer in father paternal aunt, and mother. She denies any personal history of skin cancer. ).   The following portions of the chart were reviewed this encounter and updated as appropriate:   Tobacco  Allergies  Meds  Problems  Med Hx  Surg Hx  Fam Hx      Review of Systems:  No other skin or systemic complaints except as noted in HPI or Assessment and Plan.  Objective  Well appearing patient in no apparent distress; mood and affect are within normal limits.  A focused examination was performed including arms, legs, face, neck, scalp, chest, . Relevant physical exam findings are noted in the Assessment and Plan.  Left Upper Arm x 1 Erythematous thin papules/macules with gritty scale.   Neck - Anterior Subcutaneous nodule.   bilateral ears and scalp Pink patches with greasy scale.   left posterior calf x1 Erythematous keratotic or waxy stuck-on papule or plaque.    Assessment & Plan  Actinic keratosis Left Upper Arm x 1  Actinic keratoses are precancerous spots that appear secondary to cumulative UV radiation exposure/sun exposure over time. They are chronic with expected duration over 1 year. A portion of actinic keratoses will progress to squamous cell carcinoma of the skin. It is not possible to reliably predict which spots will progress to skin cancer and so treatment is recommended to prevent development of skin cancer.  Recommend daily broad spectrum sunscreen SPF 30+ to sun-exposed areas, reapply every 2 hours as needed.  Recommend staying in the shade or wearing long sleeves, sun glasses (UVA+UVB protection) and wide brim hats (4-inch brim around the entire circumference of the  hat). Call for new or changing lesions.  Prior to procedure, discussed risks of blister formation, small wound, skin dyspigmentation, or rare scar following cryotherapy. Recommend Vaseline ointment to treated areas while healing.   Destruction of lesion - Left Upper Arm x 1  Destruction method: cryotherapy   Informed consent: discussed and consent obtained   Lesion destroyed using liquid nitrogen: Yes   Cryotherapy cycles:  2 Outcome: patient tolerated procedure well with no complications   Post-procedure details: wound care instructions given    Epidermal inclusion cyst Neck - Anterior  Benign-appearing. Exam most consistent with an epidermal inclusion cyst. Discussed that a cyst is a benign growth that can grow over time and sometimes get irritated or inflamed. Recommend observation if it is not bothersome. Discussed option of surgical excision to remove it if it is growing, symptomatic, or other changes noted. Please call for new or changing lesions so they can be evaluated.    Seborrheic dermatitis bilateral ears and scalp  Vs eczema   Seborrheic dermatitis is a chronic persistent rash characterized by pinkness and scaling most commonly of the mid face but also can occur on the scalp (dandruff), ears; mid chest, mid back and groin.  It tends to be exacerbated by stress and cooler weather.  People who have neurologic disease may experience new onset or exacerbation of existing seborrheic dermatitis.  The condition is not curable but treatable and can be controlled.  Eczema is also a chronic recurrent condition with no cure, only control  Chronic condition with duration or  expected duration over one year. Condition is bothersome to patient. Currently flared.   Start Ketoconazole 2 % shampoo - apply three times per week, massage into scalp and leave in for 10 minutes before rinsing out  Start Triamcinolone 0.1 % lotion  apply topically twice daily to affected areas of scalp and  ears. Can use for up to 2 week at ears.   Topical steroids (such as triamcinolone, fluocinolone, fluocinonide, mometasone, clobetasol, halobetasol, betamethasone, hydrocortisone) can cause thinning and lightening of the skin if they are used for too long in the same area. Your physician has selected the right strength medicine for your problem and area affected on the body. Please use your medication only as directed by your physician to prevent side effects.    triamcinolone lotion (KENALOG) 0.1 % - bilateral ears and scalp Apply 1 application topically 2 (two) times daily. To itchy areas of scalp and ears. Can use for up to 2 weeks at ears.Avoid applying to face, groin, and axilla. Use as directed.  ketoconazole (NIZORAL) 2 % shampoo - bilateral ears and scalp Apply 1 application topically See admin instructions. apply three times per week, massage into scalp and leave in for 10 minutes before rinsing out  Inflamed seborrheic keratosis left posterior calf x1  Discussed cryotherapy  Patient deferred treatment at this time.  Hemangiomas - Red papules - Discussed benign nature - Observe - Call for any changes  Sebaceous Hyperplasia Vs cyst at nose  - Small yellow papules with a central dell - Benign - Observe  Seborrheic Keratoses - Stuck-on, waxy, tan-brown papules and/or plaques at right chest , forehead,  - Benign-appearing - Discussed benign etiology and prognosis. - Observe - Call for any changes  Return for 2 - 4 month follow up on seb derm ,  1 year tbse .  I, Ruthell Rummage, CMA, am acting as scribe for Forest Gleason, MD.  Documentation: I have reviewed the above documentation for accuracy and completeness, and I agree with the above.  Forest Gleason, MD

## 2020-10-09 NOTE — Patient Instructions (Addendum)
Topical steroids (such as triamcinolone, fluocinolone, fluocinonide, mometasone, clobetasol, halobetasol, betamethasone, hydrocortisone) can cause thinning and lightening of the skin if they are used for too long in the same area. Your physician has selected the right strength medicine for your problem and area affected on the body. Please use your medication only as directed by your physician to prevent side effects.    If you have any questions or concerns for your doctor, please call our main line at [phone] and press option 4 to reach your doctor's medical assistant. If no one answers, please leave a voicemail as directed and we will return your call as soon as possible. Messages left after 4 pm will be answered the following business day.   You may also send us a message via MyChart. We typically respond to MyChart messages within 1-2 business days.  For prescription refills, please ask your pharmacy to contact our office. Our fax number is [phone].  If you have an urgent issue when the clinic is closed that cannot wait until the next business day, you can page your doctor at the number below.    Please note that while we do our best to be available for urgent issues outside of office hours, we are not available 24/7.   If you have an urgent issue and are unable to reach us, you may choose to seek medical care at your doctor's office, retail clinic, urgent care center, or emergency room.  If you have a medical emergency, please immediately call 911 or go to the emergency department.  Pager Numbers  - Dr. Kowalski: [phone]  - Dr. Moye: [phone]  - Dr. Stewart: [phone]  In the event of inclement weather, please call our main line at [phone] for an update on the status of any delays or closures.  Dermatology Medication Tips: Please keep the boxes that topical medications come in in order to help keep track of the instructions about where and how to use  these. Pharmacies typically print the medication instructions only on the boxes and not directly on the medication tubes.   If your medication is too expensive, please contact our office at [phone] option 4 or send us a message through MyChart.   We are unable to tell what your co-pay for medications will be in advance as this is different depending on your insurance coverage. However, we may be able to find a substitute medication at lower cost or fill out paperwork to get insurance to cover a needed medication.   If a prior authorization is required to get your medication covered by your insurance company, please allow us 1-2 business days to complete this process.  Drug prices often vary depending on where the prescription is filled and some pharmacies may offer cheaper prices.  The website www.goodrx.com contains coupons for medications through different pharmacies. The prices here do not account for what the cost may be with help from insurance (it may be cheaper with your insurance), but the website can give you the price if you did not use any insurance.  - You can print the associated coupon and take it with your prescription to the pharmacy.  - You may also stop by our office during regular business hours and pick up a GoodRx coupon card.  - If you need your prescription sent electronically to a different pharmacy, notify our office through [facility] MyChart or by phone at [phone] option 4.  

## 2020-10-22 ENCOUNTER — Encounter: Payer: Self-pay | Admitting: Dermatology

## 2020-11-06 ENCOUNTER — Other Ambulatory Visit: Payer: Self-pay

## 2020-11-06 ENCOUNTER — Ambulatory Visit
Admission: EM | Admit: 2020-11-06 | Discharge: 2020-11-06 | Disposition: A | Discharge status: Home / Self Care | Payer: Managed Care, Other (non HMO) | Attending: Emergency Medicine | Admitting: Emergency Medicine

## 2020-11-06 ENCOUNTER — Encounter: Payer: Self-pay | Admitting: Emergency Medicine

## 2020-11-06 DIAGNOSIS — J101 Influenza due to other identified influenza virus with other respiratory manifestations: Secondary | ICD-10-CM | POA: Diagnosis not present

## 2020-11-06 LAB — POCT INFLUENZA A/B
Influenza A, POC: POSITIVE — AB
Influenza B, POC: NEGATIVE

## 2020-11-06 NOTE — ED Provider Notes (Signed)
Name: Brianna Huff Address: [place] [address] MRN: [id number] DOB: 16-Feb-1965 Age: 55 y.o. Gender: female Encounter Date: 11/06/2020 Primary Provider: Glendon Axe, MD  S: This is a 55 y.o. female with a 4 day history of flulike symptoms   +Myalgias and arthralgia.  +fatigue  - cough  + nasal congestion w/clear rhinorrhea  - vomiting or diarrhea  Flu exposure: + Father   The following portions of the patient's history were reviewed and updated in Epic as appropriate: allergies, current medications, past medical history, past social history, past surgical history and problem list.   O:   Vitals:   11/06/20 1359  BP: 109/72  Pulse: 99  Temp: 99.7 F (37.6 C)  SpO2: 94%   Gen: WDWN, NAD   HEENT:NCAT, EOMI, EACs clear, TMS clear bilaterally  Nares without discharge; +moderate mucosal erythema and edema  OP moist with mild to moderate posterior cobblestoning, no lesions noted Neck:  Supple, shotty anterior cervical lymphadenopathy  Chest: lungs clear to auscultation bilaterally, normal respiratory effort CV:    RRR, no murmur  Skin:  no rash Psychiatric: appropriate demeanor and responsiveness   Rapid flu: +  ASSESSMENT: Influenza A  PLAN:Outside window for Tamiflu  Symptomatic care with tylenol/motrin for pain or fevers;   Increased fluids as tolerated; humidifier use qhs prn   No work/school until 24 hours fever free  F/u with PCP if worsening, or is not improving    Serafina Royals, FNP 11/06/20 1442

## 2020-11-06 NOTE — Discharge Instructions (Addendum)
You have been diagnosed with influenza. This is typically a self-limiting virus that does not require antibiotics. Most people will have symptoms for about 7 - 10 days. Pay special attention to handwashing as this can prevent spread of the virus.   Always read the labels of cough and cold medications as they may contain some of the ingredients below.  Rest, push lots of fluids (especially water), and utilize supportive care for symptoms. You may take acetaminophen (Tylenol) every 4-6 hours and ibuprofen every 6-8 hours for muscle pain, joint pain, headaches (you may also alternate these medications). Mucinex (guaifenesin) may be taken over the counter for cough as needed can loosen phlegm. Please read the instructions and take as directed.  Sudafed (pseudophedrine) is sold behind the counter and can help reduce nasal pressure; avoid taking this if you have high blood pressure or feel jittery. Sudafed PE (phenylephrine) can be a helpful, short-term, over-the-counter alternative to limit side effects or if you have high blood pressure.  Flonase nasal spray can help alleviate congestion and sinus pressure. Many patients choose Afrin as a nasal decongestant; do not use for more than 3 days for risk of rebound (increased symptoms after stopping medication).  Saline nasal sprays or rinses can also help nasal congestion (use bottled or sterile water). Warm tea with lemon and honey can sooth sore throat and cough, as can cough drops.   Return to clinic for high fever not improving with medications, chest pain, difficulty breathing, non-stop vomiting, or coughing blood. Follow-up with your primary care provider if symptoms do not improve as expected in the next 5-7 days.

## 2020-11-06 NOTE — ED Triage Notes (Signed)
Pt presents with sinus pressure/pain and bodyaches x 4 days

## 2020-11-11 ENCOUNTER — Other Ambulatory Visit: Payer: Self-pay

## 2020-11-11 ENCOUNTER — Ambulatory Visit
Admission: RE | Admit: 2020-11-11 | Discharge: 2020-11-11 | Disposition: A | Discharge status: Home / Self Care | Payer: Managed Care, Other (non HMO) | Source: Ambulatory Visit | Attending: Internal Medicine | Admitting: Internal Medicine

## 2020-11-11 VITALS — BP 121/63 | HR 78 | Temp 97.8°F

## 2020-11-11 DIAGNOSIS — J019 Acute sinusitis, unspecified: Secondary | ICD-10-CM

## 2020-11-11 MED ORDER — METHYLPREDNISOLONE 4 MG PO TBPK: ORAL_TABLET | ORAL | 0 refills | Status: DC

## 2020-11-11 MED ORDER — DOXYCYCLINE HYCLATE 100 MG PO CAPS: 100.0000 mg | ORAL_CAPSULE | Freq: Two times a day (BID) | ORAL | 0 refills | Status: DC

## 2020-11-11 NOTE — ED Provider Notes (Signed)
[facility]    CSN: [id number] Arrival date & time: 11/11/20  1351      History   Chief Complaint Chief Complaint  Patient presents with   Cough   Sinus Pressure     HPI Brianna Huff is a 55 y.o. female who presents with sinus pressure productive cough on occasion x 5 days. Had influenza A 11/5 and has only taken Tylenol. Has had sinus surgery in the 90's. Her ENT places her on prednisone taper. Is unable to tolerate saline rinses. Has sinus pain on L ethmoid and frontal region, and both maxillary sinuses.  Has been using the Flonase til before the Flu. Has not taken any other meds.  She has not had a fever since 11/4  Past Medical History:  Diagnosis Date   Anxiety and depression    Cyst of left kidney    GERD (gastroesophageal reflux disease)    Gross hematuria    Internal hemorrhoids    Kidney stones    Low back pain    Lower abdominal pain    Monilial vaginitis    Nasal congestion    Shortness of breath dyspnea    Vaginitis     Patient Active Problem List   Diagnosis Date Noted   Left ureteral stone 11/16/2014   Gross hematuria 11/05/2014   Blood in the urine 10/27/2014   Frank hematuria 10/25/2014   Acute vaginitis 10/01/2014   Acute back pain with sciatica 09/25/2014   Hemorrhoids, internal 09/25/2014   Anxiety and depression 09/25/2014   Candida vaginitis 09/25/2014   Cigarette smoker 02/20/2014    Past Surgical History:  Procedure Laterality Date   ABDOMINAL HYSTERECTOMY     still has ovaries    CYSTOSCOPY W/ RETROGRADES Bilateral 11/26/2014   Procedure: CYSTOSCOPY WITH RETROGRADE PYELOGRAM;  Surgeon: Hollice Espy, MD;  Location: [facility];  Service: Urology;  Laterality: Bilateral;   CYSTOSCOPY WITH STENT PLACEMENT Left 11/13/2014   Procedure: CYSTOSCOPY, left retrograde pyelogram WITH left ureteral STENT PLACEMENT;  Surgeon: Cleon Gustin, MD;  Location: [facility];  Service: Urology;  Laterality: Left;    CYSTOSCOPY/URETEROSCOPY/HOLMIUM LASER/STENT PLACEMENT Left 11/26/2014   Procedure: CYSTOSCOPY/URETEROSCOPY/HOLMIUM LASER/STENT PLACEMENT;  Surgeon: Hollice Espy, MD;  Location: [facility];  Service: Urology;  Laterality: Left;   NASAL SINUS SURGERY     TUBAL LIGATION      OB History   No obstetric history on file.      Home Medications    Prior to Admission medications   Medication Sig Start Date End Date Taking? Authorizing Provider  doxycycline (VIBRAMYCIN) 100 MG capsule Take 1 capsule (100 mg total) by mouth 2 (two) times daily. 11/11/20  Yes Rodriguez-Southworth, Sunday Spillers, PA-C  methylPREDNISolone (MEDROL DOSEPAK) 4 MG TBPK tablet One bid x 10 days 11/11/20  Yes Rodriguez-Southworth, Sunday Spillers, PA-C  citalopram (CELEXA) 10 MG tablet Take 10 mg by mouth daily.    [provider]  HYDROcodone-acetaminophen (NORCO/VICODIN) 5-325 MG tablet Take 1 tablet by mouth every 6 (six) hours as needed for moderate pain. 12/27/15   Lisa Roca, MD  ketoconazole (NIZORAL) 2 % shampoo Apply 1 application topically See admin instructions. apply three times per week, massage into scalp and leave in for 10 minutes before rinsing out 10/09/20   Moye, [place], MD  meloxicam (MOBIC) 7.5 MG tablet TAKE 1 TABLET (7.5 MG TOTAL) BY MOUTH ONCE DAILY. 11/29/14   [provider]  omeprazole (PRILOSEC) 40 MG capsule Take 40 mg by mouth daily.  [provider]  ondansetron (ZOFRAN) 4 MG tablet Take 1 tablet (4 mg total) by mouth every 8 (eight) hours as needed for nausea or vomiting. 12/27/15   Lisa Roca, MD  triamcinolone lotion (KENALOG) 0.1 % Apply 1 application topically 2 (two) times daily. To itchy areas of scalp and ears. Can use for up to 2 weeks at ears.Avoid applying to face, groin, and axilla. Use as directed. 10/09/20   Alfonso Patten, MD    Family History Family History  Problem Relation Age of Onset   Breast cancer Mother    Prostate cancer Neg Hx    Kidney cancer Neg Hx     Bladder Cancer Neg Hx     Social History Social History   Tobacco Use   Smoking status: Every Day    Packs/day: 1.00    Years: 35.00    Pack years: 35.00    Types: Cigarettes   Smokeless tobacco: Never  Vaping Use   Vaping Use: Never used  Substance Use Topics   Alcohol use: Yes    Alcohol/week: 4.0 standard drinks    Types: 4 Standard drinks or equivalent per week   Drug use: No     Allergies   2,4-d dimethylamine (amisol); Sulfa antibiotics; Penicillin g; and Omnicef [cefdinir]   Review of Systems Review of Systems  Constitutional:  Positive for fatigue. Negative for appetite change, chills, diaphoresis and fever.  HENT:  Positive for congestion, postnasal drip, rhinorrhea and sinus pain.   Eyes:  Negative for discharge.  Musculoskeletal:  Negative for gait problem.  Skin:  Negative for rash.    Physical Exam Triage Vital Signs ED Triage Vitals [11/11/20 1359]  Enc Vitals Group     BP 121/63     Pulse Rate 78     Resp      Temp 97.8 F (36.6 C)     Temp Source Oral     SpO2 97 %     Weight      Height      Head Circumference      Peak Flow      Pain Score      Pain Loc      Pain Edu?      Excl. in [place]?    No data found.  Updated Vital Signs BP 121/63 (BP Location: Left Arm)   Pulse 78   Temp 97.8 F (36.6 C) (Oral)   SpO2 97%   Visual Acuity Right Eye Distance:   Left Eye Distance:   Bilateral Distance:    Right Eye Near:   Left Eye Near:    Bilateral Near:     Physical Exam Physical Exam Vitals signs and nursing note reviewed.  Constitutional:      General: She is not in acute distress.    Appearance: Normal appearance. She is not ill-appearing, toxic-appearing or diaphoretic.  HENT:     Head: Normocephalic.     Right Ear: Tympanic membrane, ear canal and external ear normal.     Left Ear: Tympanic membrane, ear canal and external ear normal.     Nose: purulent mucous, has tenderness on both maxillary sinuses and L ethmoid and  frontal sinus     Mouth/Throat:     Mouth: Mucous membranes are moist.  Eyes:     General: No scleral icterus.       Right eye: No discharge.        Left eye: No discharge.     Conjunctiva/sclera: Conjunctivae normal.  Neck:     Musculoskeletal: Neck supple. No neck rigidity.  Cardiovascular:     Rate and Rhythm: Normal rate and regular rhythm.     Heart sounds: No murmur.  Pulmonary:     Effort: Pulmonary effort is normal.     Breath sounds: Normal breath sounds.  Musculoskeletal: Normal range of motion.  Lymphadenopathy:     Cervical: No cervical adenopathy.  Skin:    General: Skin is warm and dry.     Coloration: Skin is not jaundiced.     Findings: No rash.  Neurological:     Mental Status: She is alert and oriented to person, place, and time.     Gait: Gait normal.  Psychiatric:        Mood and Affect: Mood normal.        Behavior: Behavior normal.        Thought Content: Thought content normal.        Judgment: Judgment normal.    UC Treatments / Results  Labs (all labs ordered are listed, but only abnormal results are displayed) Labs Reviewed - No data to display  EKG   Radiology No results found.  Procedures Procedures (including critical care time)  Medications Ordered in UC Medications - No data to display  Initial Impression / Assessment and Plan / UC Course  I have reviewed the triage vital signs and the nursing notes. Resolved Influenza with secondary infection.  Acute sinusitis. I placed her on Augmentin and Medrol as noted. May get back on her Flonase.  Final Clinical Impressions(s) / UC Diagnoses   Final diagnoses:  Acute non-recurrent sinusitis, unspecified location   Discharge Instructions   None    ED Prescriptions     Medication Sig Dispense Auth. Provider   doxycycline (VIBRAMYCIN) 100 MG capsule Take 1 capsule (100 mg total) by mouth 2 (two) times daily. 20 capsule Rodriguez-Southworth, Sunday Spillers, PA-C   methylPREDNISolone (MEDROL  DOSEPAK) 4 MG TBPK tablet One bid x 10 days 20 tablet Rodriguez-Southworth, Sunday Spillers, PA-C      PDMP not reviewed this encounter.   Shelby Mattocks, PA-C 11/11/20 1710

## 2020-11-11 NOTE — ED Triage Notes (Signed)
Pt was seen 11/3 dx with flu. She has sinus pressure/pain and a productive cough.

## 2021-01-11 ENCOUNTER — Ambulatory Visit
Admission: EM | Admit: 2021-01-11 | Discharge: 2021-01-11 | Disposition: A | Discharge status: Home / Self Care | Payer: Managed Care, Other (non HMO)

## 2021-01-11 ENCOUNTER — Encounter: Payer: Self-pay | Admitting: Emergency Medicine

## 2021-01-11 DIAGNOSIS — H66002 Acute suppurative otitis media without spontaneous rupture of ear drum, left ear: Secondary | ICD-10-CM

## 2021-01-11 DIAGNOSIS — Z889 Allergy status to unspecified drugs, medicaments and biological substances status: Secondary | ICD-10-CM

## 2021-01-11 DIAGNOSIS — J0140 Acute pansinusitis, unspecified: Secondary | ICD-10-CM | POA: Diagnosis not present

## 2021-01-11 DIAGNOSIS — R058 Other specified cough: Secondary | ICD-10-CM

## 2021-01-11 MED ORDER — FLUTICASONE PROPIONATE 50 MCG/ACT NA SUSP: 2.0000 | Freq: Every day | NASAL | 0 refills | Status: DC

## 2021-01-11 MED ORDER — DOXYCYCLINE HYCLATE 100 MG PO CAPS: 100.0000 mg | ORAL_CAPSULE | Freq: Two times a day (BID) | ORAL | 0 refills | Status: DC

## 2021-01-11 MED ORDER — PREDNISONE 10 MG PO TABS: 20.0000 mg | ORAL_TABLET | Freq: Every day | ORAL | 0 refills | Status: AC

## 2021-01-11 NOTE — ED Triage Notes (Signed)
Pt presents wit cough, runny nose, ST x 1 week.

## 2021-01-11 NOTE — ED Provider Notes (Signed)
[facility]    CSN: [id number] Arrival date & time: 01/11/21  0930      History   Chief Complaint Chief Complaint  Patient presents with   Headache   Sore Throat   Cough    HPI Brianna Huff is a 56 y.o. female.   HPI  Cold Symptoms: Patient reports that they have had symptoms of productive cough with green/yellow sputum, nasal congestion, sinus pressure, sinus headaches, ear pain and congestion for the past 7 days. Symptoms are worsening. They deny SOB, chest pain, fever or vomiting. They have tried mucinex for symptoms. No known sick contacts. Has a long history of sinus infections.    Past Medical History:  Diagnosis Date   Anxiety and depression    Cyst of left kidney    GERD (gastroesophageal reflux disease)    Gross hematuria    Internal hemorrhoids    Kidney stones    Low back pain    Lower abdominal pain    Monilial vaginitis    Nasal congestion    Shortness of breath dyspnea    Vaginitis     Patient Active Problem List   Diagnosis Date Noted   Left ureteral stone 11/16/2014   Gross hematuria 11/05/2014   Blood in the urine 10/27/2014   Frank hematuria 10/25/2014   Acute vaginitis 10/01/2014   Acute back pain with sciatica 09/25/2014   Hemorrhoids, internal 09/25/2014   Anxiety and depression 09/25/2014   Candida vaginitis 09/25/2014   Cigarette smoker 02/20/2014    Past Surgical History:  Procedure Laterality Date   ABDOMINAL HYSTERECTOMY     still has ovaries    CYSTOSCOPY W/ RETROGRADES Bilateral 11/26/2014   Procedure: CYSTOSCOPY WITH RETROGRADE PYELOGRAM;  Surgeon: Hollice Espy, MD;  Location: [facility];  Service: Urology;  Laterality: Bilateral;   CYSTOSCOPY WITH STENT PLACEMENT Left 11/13/2014   Procedure: CYSTOSCOPY, left retrograde pyelogram WITH left ureteral STENT PLACEMENT;  Surgeon: Cleon Gustin, MD;  Location: [facility];  Service: Urology;  Laterality: Left;   CYSTOSCOPY/URETEROSCOPY/HOLMIUM LASER/STENT PLACEMENT  Left 11/26/2014   Procedure: CYSTOSCOPY/URETEROSCOPY/HOLMIUM LASER/STENT PLACEMENT;  Surgeon: Hollice Espy, MD;  Location: [facility];  Service: Urology;  Laterality: Left;   NASAL SINUS SURGERY     TUBAL LIGATION      OB History   No obstetric history on file.      Home Medications    Prior to Admission medications   Medication Sig Start Date End Date Taking? Authorizing Provider  estradiol (ESTRACE) 0.5 MG tablet Take by mouth. 02/27/19  Yes [provider]  omeprazole (PRILOSEC) 40 MG capsule Take 40 mg by mouth daily.   Yes [provider]  citalopram (CELEXA) 10 MG tablet Take 10 mg by mouth daily.    [provider]  doxycycline (VIBRAMYCIN) 100 MG capsule Take 1 capsule (100 mg total) by mouth 2 (two) times daily. 11/11/20   Rodriguez-Southworth, Sunday Spillers, PA-C  HYDROcodone-acetaminophen (NORCO/VICODIN) 5-325 MG tablet Take 1 tablet by mouth every 6 (six) hours as needed for moderate pain. 12/27/15   Lisa Roca, MD  ketoconazole (NIZORAL) 2 % shampoo Apply 1 application topically See admin instructions. apply three times per week, massage into scalp and leave in for 10 minutes before rinsing out 10/09/20   Moye, [place], MD  meloxicam (MOBIC) 7.5 MG tablet TAKE 1 TABLET (7.5 MG TOTAL) BY MOUTH ONCE DAILY. 11/29/14   [provider]  methylPREDNISolone (MEDROL DOSEPAK) 4 MG TBPK tablet One bid x 10 days 11/11/20  Rodriguez-Southworth, Sunday Spillers, PA-C  ondansetron (ZOFRAN) 4 MG tablet Take 1 tablet (4 mg total) by mouth every 8 (eight) hours as needed for nausea or vomiting. 12/27/15   Lisa Roca, MD  triamcinolone lotion (KENALOG) 0.1 % Apply 1 application topically 2 (two) times daily. To itchy areas of scalp and ears. Can use for up to 2 weeks at ears.Avoid applying to face, groin, and axilla. Use as directed. 10/09/20   Alfonso Patten, MD    Family History Family History  Problem Relation Age of Onset   Breast cancer Mother    Prostate cancer  Neg Hx    Kidney cancer Neg Hx    Bladder Cancer Neg Hx     Social History Social History   Tobacco Use   Smoking status: Every Day    Packs/day: 1.00    Years: 35.00    Pack years: 35.00    Types: Cigarettes   Smokeless tobacco: Never  Vaping Use   Vaping Use: Never used  Substance Use Topics   Alcohol use: Yes    Alcohol/week: 4.0 standard drinks    Types: 4 Standard drinks or equivalent per week   Drug use: No     Allergies   2,4-d dimethylamine (amisol); Sulfa antibiotics; Azithromycin; Penicillin g; and Omnicef [cefdinir]   Review of Systems Review of Systems  As stated above in HPI Physical Exam Triage Vital Signs ED Triage Vitals  Enc Vitals Group     BP 01/11/21 0952 133/80     Pulse Rate 01/11/21 0952 (!) 112     Resp 01/11/21 0952 16     Temp 01/11/21 0952 98.7 F (37.1 C)     Temp Source 01/11/21 0952 Oral     SpO2 01/11/21 0952 95 %     Weight --      Height --      Head Circumference --      Peak Flow --      Pain Score 01/11/21 0956 3     Pain Loc --      Pain Edu? --      Excl. in [place]? --    No data found.  Updated Vital Signs BP 133/80 (BP Location: Left Arm)    Pulse (!) 112    Temp 98.7 F (37.1 C) (Oral)    Resp 16    SpO2 95%   Physical Exam Vitals and nursing note reviewed.  Constitutional:      General: She is not in acute distress.    Appearance: She is well-developed. She is not ill-appearing, toxic-appearing or diaphoretic.  HENT:     Head: Normocephalic and atraumatic.     Right Ear: A middle ear effusion is present.     Left Ear: A middle ear effusion is present. Tympanic membrane is erythematous.     Nose: Congestion (Maxillary and frontal sinus bogginess) present. No rhinorrhea.     Mouth/Throat:     Mouth: Mucous membranes are moist.     Pharynx: Uvula midline. Oropharyngeal exudate present. No posterior oropharyngeal erythema.     Tonsils: No tonsillar exudate or tonsillar abscesses.  Eyes:     Extraocular  Movements: Extraocular movements intact.     Right eye: Normal extraocular motion.     Left eye: Normal extraocular motion.     Pupils: Pupils are equal, round, and reactive to light.  Cardiovascular:     Rate and Rhythm: Normal rate and regular rhythm.     Heart sounds: Normal heart sounds.  Pulmonary:     Effort: Pulmonary effort is normal.     Breath sounds: Normal breath sounds.  Musculoskeletal:     Cervical back: Normal range of motion and neck supple. No rigidity.  Lymphadenopathy:     Cervical: No cervical adenopathy.  Skin:    General: Skin is warm.  Neurological:     Mental Status: She is alert and oriented to person, place, and time.     UC Treatments / Results  Labs (all labs ordered are listed, but only abnormal results are displayed) Labs Reviewed - No data to display  EKG   Radiology No results found.  Procedures Procedures (including critical care time)  Medications Ordered in UC Medications - No data to display  Initial Impression / Assessment and Plan / UC Course  I have reviewed the triage vital signs and the nursing notes.  Pertinent labs & imaging results that were available during my care of the patient were reviewed by me and considered in my medical decision making (see chart for details).     New. Treating for AOM, Acute sinusitis and covering for potential pneumonia with doxycyline and a low dose steroid taper which she reports that she has done well with in the past. Rest, fluids sinus rinses. Discussed red flag signs and symptoms. Follow up PRN.    Final Clinical Impressions(s) / UC Diagnoses   Final diagnoses:  None   Discharge Instructions   None    ED Prescriptions   None    PDMP not reviewed this encounter.   Hughie Closs, [place] 01/11/21 1043

## 2021-01-29 ENCOUNTER — Ambulatory Visit: Payer: Managed Care, Other (non HMO) | Admitting: Dermatology

## 2021-01-31 ENCOUNTER — Other Ambulatory Visit: Payer: Self-pay

## 2021-01-31 ENCOUNTER — Emergency Department: Payer: Managed Care, Other (non HMO)

## 2021-01-31 ENCOUNTER — Emergency Department
Admission: EM | Admit: 2021-01-31 | Discharge: 2021-01-31 | Disposition: A | Discharge status: Home / Self Care | Payer: Managed Care, Other (non HMO) | Attending: Emergency Medicine | Admitting: Emergency Medicine

## 2021-01-31 DIAGNOSIS — N202 Calculus of kidney with calculus of ureter: Secondary | ICD-10-CM | POA: Diagnosis not present

## 2021-01-31 DIAGNOSIS — R109 Unspecified abdominal pain: Secondary | ICD-10-CM | POA: Diagnosis present

## 2021-01-31 DIAGNOSIS — N200 Calculus of kidney: Secondary | ICD-10-CM

## 2021-01-31 LAB — BASIC METABOLIC PANEL
Anion gap: 12 (ref 5–15)
BUN: 18 mg/dL (ref 6–20)
CO2: 22 mmol/L (ref 22–32)
Calcium: 9.9 mg/dL (ref 8.9–10.3)
Chloride: 105 mmol/L (ref 98–111)
Creatinine, Ser: 0.95 mg/dL (ref 0.44–1.00)
GFR, Estimated: >60 mL/min (ref >60)
Glucose, Bld: 129 mg/dL — ABNORMAL HIGH (ref 70–99)
Potassium: 3.9 mmol/L (ref 3.5–5.1)
Sodium: 139 mmol/L (ref 135–145)

## 2021-01-31 LAB — URINALYSIS, MICROSCOPIC (REFLEX)

## 2021-01-31 LAB — CBC
HCT: 43.6 % (ref 36.0–46.0)
Hemoglobin: 14.5 g/dL (ref 12.0–15.0)
MCH: 30.9 pg (ref 26.0–34.0)
MCHC: 33.3 g/dL (ref 30.0–36.0)
MCV: 92.8 fL (ref 80.0–100.0)
Platelets: 370 10*3/uL (ref 150–400)
RBC: 4.7 MIL/uL (ref 3.87–5.11)
RDW: 12.5 % (ref 11.5–15.5)
WBC: 8.5 10*3/uL (ref 4.0–10.5)
nRBC: 0 % (ref 0.0–0.2)

## 2021-01-31 LAB — URINALYSIS, ROUTINE W REFLEX MICROSCOPIC
Bilirubin Urine: NEGATIVE
Glucose, UA: NEGATIVE mg/dL
Ketones, ur: 40 mg/dL — AB
Leukocytes,Ua: NEGATIVE
Nitrite: NEGATIVE
Protein, ur: 30 mg/dL — AB
Specific Gravity, Urine: 1.02 (ref 1.005–1.030)
pH: 8.5 — ABNORMAL HIGH (ref 5.0–8.0)

## 2021-01-31 MED ORDER — ONDANSETRON 4 MG PO TBDP: 4.0000 mg | ORAL_TABLET | Freq: Three times a day (TID) | ORAL | 0 refills | Status: DC | PRN

## 2021-01-31 MED ORDER — FENTANYL CITRATE PF 50 MCG/ML IJ SOSY
50.0000 ug | PREFILLED_SYRINGE | INTRAMUSCULAR | Status: DC | PRN
  Administered 2021-01-31: 50 ug via INTRAVENOUS
  Filled 2021-01-31: qty 1

## 2021-01-31 MED ORDER — HYDROCODONE-ACETAMINOPHEN 5-325 MG PO TABS: 1.0000 | ORAL_TABLET | Freq: Three times a day (TID) | ORAL | 0 refills | Status: AC | PRN

## 2021-01-31 MED ORDER — MORPHINE SULFATE (PF) 4 MG/ML IV SOLN
4.0000 mg | Freq: Once | INTRAVENOUS | Status: AC
  Administered 2021-01-31: 4 mg via INTRAVENOUS
  Filled 2021-01-31: qty 1

## 2021-01-31 MED ORDER — TAMSULOSIN HCL 0.4 MG PO CAPS
0.4000 mg | ORAL_CAPSULE | Freq: Once | ORAL | Status: AC
  Administered 2021-01-31: 0.4 mg via ORAL
  Filled 2021-01-31: qty 1

## 2021-01-31 MED ORDER — ONDANSETRON HCL 4 MG/2ML IJ SOLN
4.0000 mg | Freq: Once | INTRAMUSCULAR | Status: AC
  Administered 2021-01-31: 4 mg via INTRAVENOUS
  Filled 2021-01-31: qty 2

## 2021-01-31 MED ORDER — KETOROLAC TROMETHAMINE 30 MG/ML IJ SOLN
30.0000 mg | Freq: Once | INTRAMUSCULAR | Status: AC
Start: 2021-01-31 — End: 2021-01-31
  Administered 2021-01-31: 30 mg via INTRAVENOUS
  Filled 2021-01-31: qty 1

## 2021-01-31 MED ORDER — SODIUM CHLORIDE 0.9 % IV BOLUS
500.0000 mL | Freq: Once | INTRAVENOUS | Status: AC
  Administered 2021-01-31: 500 mL via INTRAVENOUS

## 2021-01-31 MED ORDER — TAMSULOSIN HCL 0.4 MG PO CAPS: 0.4000 mg | ORAL_CAPSULE | Freq: Every day | ORAL | 0 refills | Status: AC

## 2021-01-31 NOTE — Discharge Instructions (Addendum)
Take the pain medicine as prescribed and nausea medicine as needed.  Increase fluid intake to allow for regular bladder emptying.

## 2021-01-31 NOTE — ED Notes (Signed)
Pt verbalized understanding of discharge instructions, follow-up care, and prescriptions discussed. Pt advised if symptoms worsen to return to ED.

## 2021-01-31 NOTE — ED Notes (Signed)
See triage note. Pt vomited twice. C/o of flank pain and has a hx of kidney stones.

## 2021-01-31 NOTE — ED Triage Notes (Signed)
Patient to ER via POV with complaints of right sided flank paint that started an hour ago. Reports hx of kidney stones. Reports she has been nauseous since and had multiple episodes of emesis at home.

## 2021-01-31 NOTE — ED Notes (Signed)
Patient transported to CT 

## 2021-02-01 ENCOUNTER — Ambulatory Visit: Payer: Self-pay

## 2021-02-01 NOTE — ED Provider Notes (Signed)
[facility] Provider Note  Patient Contact: 4:21 PM (approximate)   History   Flank Pain   HPI  Brianna Huff is a 56 y.o. female with a history nephrolithiasis resents to the ED for evaluation of right-sided flank pain that started about an hour prior to she reports some nausea as well as vomiting at home.  Patient also reports some urinary retention, but denies any frank fevers or gross hematuria.   Physical Exam   Triage Vital Signs: ED Triage Vitals  Enc Vitals Group     BP 01/31/21 1726 133/89     Pulse Rate 01/31/21 1726 (!) 110     Resp 01/31/21 1726 16     Temp 01/31/21 1726 98.3 F (36.8 C)     Temp Source 01/31/21 1726 Oral     SpO2 01/31/21 1726 95 %     Weight --      Height 01/31/21 1630 5\' 8"  (1.727 m)     Head Circumference --      Peak Flow --      Pain Score 01/31/21 1630 10     Pain Loc --      Pain Edu? --      Excl. in [place]? --     Most recent vital signs: Vitals:   01/31/21 2000 01/31/21 2212  BP: 133/73 117/69  Pulse: (!) 103 82  Resp: 16 16  Temp:    SpO2: 98% 97%     General: Alert and in no acute distress. Cardiovascular:  Good peripheral perfusion Respiratory: Normal respiratory effort without tachypnea or retractions. Lungs CTAB.  Gastrointestinal: Bowel sounds 4 quadrants. Soft and nontender to palpation. No guarding or rigidity. No palpable masses. No distention.  Mild right CVA tenderness. Musculoskeletal: Full range of motion to all extremities.  Neurologic:  No gross focal neurologic deficits are appreciated.  Skin:   No rash noted Other:   ED Results / Procedures / Treatments   Labs (all labs ordered are listed, but only abnormal results are displayed) Labs Reviewed  URINALYSIS, ROUTINE W REFLEX MICROSCOPIC - Abnormal; Notable for the following components:      Result Value   APPearance CLOUDY (*)    pH 8.5 (*)    Hgb urine dipstick MODERATE (*)    Ketones, ur 40 (*)    Protein, ur 30 (*)     All other components within normal limits  BASIC METABOLIC PANEL - Abnormal; Notable for the following components:   Glucose, Bld 129 (*)    All other components within normal limits  URINALYSIS, MICROSCOPIC (REFLEX) - Abnormal; Notable for the following components:   Bacteria, UA RARE (*)    All other components within normal limits  CBC     EKG   RADIOLOGY  I personally viewed and evaluated these images as part of my medical decision making, as well as reviewing the written report by the radiologist.  ED Provider Interpretation: multiple renal calculi noted  CT Renal Stone Study  Result Date: 01/31/2021 CLINICAL DATA:  Right flank pain, history of kidney stones EXAM: CT ABDOMEN AND PELVIS WITHOUT CONTRAST TECHNIQUE: Multidetector CT imaging of the abdomen and pelvis was performed following the standard protocol without IV contrast. RADIATION DOSE REDUCTION: This exam was performed according to the departmental dose-optimization program which includes automated exposure control, adjustment of the mA and/or kV according to patient size and/or use of iterative reconstruction technique. COMPARISON:  11/28/2014 FINDINGS: Lower chest: Lung bases are clear. Hepatobiliary:  Unenhanced liver is unremarkable. Gallbladder is unremarkable. No intrahepatic or extrahepatic ductal dilatation. Pancreas: Within normal limits. Spleen: Within normal limits. Adrenals/Urinary Tract: Adrenal glands are within normal limits. 2.6 cm medial left upper pole renal cyst (series 2/image 24). Multiple nonobstructing renal calculi bilaterally, measuring 1-3 mm, including a 3 mm right upper pole calculus (coronal image 101) and 2 punctate left lower pole renal calculi (coronal image 92). Punctate distal right ureteral calculus at the UVJ (series 2/image 80). No hydronephrosis. Bladder is within limits. Stomach/Bowel: Stomach is within normal limits. No evidence of bowel obstruction. Appendix is not discretely visualized.  Mild wall thickening involving the ascending/proximal transverse colon, with submucosal fat deposition, likely chronic without acute inflammatory changes. Scattered colonic diverticulosis, without evidence of acute diverticulitis. Vascular/Lymphatic: No evidence of abdominal aortic aneurysm. Atherosclerotic calcifications of the abdominal aorta and branch vessels. No suspicious abdominopelvic lymphadenopathy. Reproductive: Status post hysterectomy. No adnexal masses. Other: No abdominopelvic ascites. Musculoskeletal: Visualized osseous structures are within normal limits. IMPRESSION: Punctate distal right ureteral calculus at the UVJ. No hydronephrosis. Additional nonobstructing bilateral renal calculi, measuring up to 3 mm in the right upper pole. Additional ancillary findings as above. Electronically Signed   By: Julian Hy M.D.   On: 01/31/2021 19:08    PROCEDURES:  Critical Care performed: No  Procedures   MEDICATIONS ORDERED IN ED: Medications  ondansetron (ZOFRAN) injection 4 mg (4 mg Intravenous Given 01/31/21 1638)  morphine 4 MG/ML injection 4 mg (4 mg Intravenous Given 01/31/21 1838)  sodium chloride 0.9 % bolus 500 mL (0 mLs Intravenous Stopped 01/31/21 2142)  ketorolac (TORADOL) 30 MG/ML injection 30 mg (30 mg Intravenous Given 01/31/21 2029)  tamsulosin (FLOMAX) capsule 0.4 mg (0.4 mg Oral Given 01/31/21 2141)     IMPRESSION / MDM / ASSESSMENT AND PLAN / ED COURSE  I reviewed the triage vital signs and the nursing notes.                              Differential diagnosis includes, but is not limited to, nephrolithiasis, pyelonephritis, cystitis, hydronephrosis, infected renal stone  Patient ED evaluation of right flank pain, nausea, vomiting, and urinary retention.  She was evaluated for complaints in the ED, found to have reassuring labs.  No acute leukocyturia, electrolyte abnormality on BMP, and UA does not reveal any pyuria or leukocyturia.  Morphis crystals are present.   CT scan performed reveals multiple nonobstructing stones in bilateral kidneys and ureters.  Patient's diagnosis is consistent with nonobstructive renal stones renal colic.  Patient was able to urinate without difficulty after fluid bolus and IV pain medicines.  She be discharged prescriptions for tamsulosin, Zofran, and a small prescription for hydrocodone for expectant management.  Patient is to follow up with urology as needed or otherwise directed. Patient is given ED precautions to return to the ED for any worsening or new symptoms.    FINAL CLINICAL IMPRESSION(S) / ED DIAGNOSES   Final diagnoses:  Nephrolithiasis     Rx / DC Orders   ED Discharge Orders          Ordered    HYDROcodone-acetaminophen (NORCO) 5-325 MG tablet  3 times daily PRN        01/31/21 2034    ondansetron (ZOFRAN-ODT) 4 MG disintegrating tablet  Every 8 hours PRN        01/31/21 2034    tamsulosin (FLOMAX) 0.4 MG CAPS capsule  Daily after supper  01/31/21 2149             Note:  This document was prepared using Dragon voice recognition software and may include unintentional dictation errors.    Melvenia Needles, PA-C 02/01/21 1808    Carrie Mew, MD 02/01/21 2328

## 2021-04-17 ENCOUNTER — Ambulatory Visit (INDEPENDENT_AMBULATORY_CARE_PROVIDER_SITE_OTHER): Payer: Managed Care, Other (non HMO) | Admitting: Internal Medicine

## 2021-04-17 ENCOUNTER — Encounter: Payer: Self-pay | Admitting: Internal Medicine

## 2021-04-17 VITALS — BP 122/84 | HR 105 | Temp 98.2°F | Resp 16 | Ht 68.0 in | Wt 184.4 lb

## 2021-04-17 DIAGNOSIS — Z13220 Encounter for screening for lipoid disorders: Secondary | ICD-10-CM

## 2021-04-17 DIAGNOSIS — Z1159 Encounter for screening for other viral diseases: Secondary | ICD-10-CM

## 2021-04-17 DIAGNOSIS — J302 Other seasonal allergic rhinitis: Secondary | ICD-10-CM | POA: Diagnosis not present

## 2021-04-17 DIAGNOSIS — Z87442 Personal history of urinary calculi: Secondary | ICD-10-CM | POA: Diagnosis not present

## 2021-04-17 DIAGNOSIS — R7309 Other abnormal glucose: Secondary | ICD-10-CM

## 2021-04-17 DIAGNOSIS — K219 Gastro-esophageal reflux disease without esophagitis: Secondary | ICD-10-CM

## 2021-04-17 DIAGNOSIS — Z1211 Encounter for screening for malignant neoplasm of colon: Secondary | ICD-10-CM

## 2021-04-17 DIAGNOSIS — R232 Flushing: Secondary | ICD-10-CM

## 2021-04-17 DIAGNOSIS — Z114 Encounter for screening for human immunodeficiency virus [HIV]: Secondary | ICD-10-CM

## 2021-04-17 DIAGNOSIS — Z1231 Encounter for screening mammogram for malignant neoplasm of breast: Secondary | ICD-10-CM

## 2021-04-17 MED ORDER — PANTOPRAZOLE SODIUM 40 MG PO TBEC: 40.0000 mg | DELAYED_RELEASE_TABLET | Freq: Every day | ORAL | 3 refills | Status: DC

## 2021-04-17 NOTE — Assessment & Plan Note (Signed)
Start Pantoprazole 40 mg daily, educated on lifestyle changes and avoiding trigger foods. Follow up in 1 month for recheck, may require repeat EGD. ?

## 2021-04-17 NOTE — Assessment & Plan Note (Signed)
Reviewed CT scan and ED note as well as blood work from January. Continue to stay well hydrated, educated on further stone prevention.  ?

## 2021-04-17 NOTE — Assessment & Plan Note (Signed)
Continue Flonase, try oral anti-histamine and anti-histamine eye drops to help with symptoms. Given a list of OTC medications that would help with symptoms.  ?

## 2021-04-17 NOTE — Progress Notes (Signed)
? ?New Patient Office Visit ? ?Subjective:  ?Patient ID: Brianna Huff, female    DOB: [birth year]  Age: 56 y.o. MRN: [id number] ? ?CC:  ?Chief Complaint  ?Patient presents with  ? Establish Care  ? Heartburn  ?  Wants to get rx  ? ? ?HPI ?Brianna Huff presents to establish care.  ? ?GERD: ?-Having issues with increased gas, burping and burning epigastric pain ?-No nausea or vomiting but it does affect sleep sometimes ?-Currently on Omeprazole 40 mg once a day and zantac in the evening but still having symptoms  ?-Weight stable ? ?Hx of kidney stones:  ?-History of stones twice, most recent in January, had to go to the ER. Multiple stones on CT, largest 3 mm, passed on their own. Tries to avoid tea and dark colas, tries to stay well hydrated. ? ?Hot Flashes ?-Worse at night, at least once a night but during the day too ? ?Seasonal Allergies: ?-Currently on Flonase ?-Complaining of watery, itching eyes, sneezing  ? ?Health Maintenance: ?-Blood work due ?-Mammogram due ?-Colon cancer screening: due ? ?Past Medical History:  ?Diagnosis Date  ? Anxiety and depression   ? Cyst of left kidney   ? GERD (gastroesophageal reflux disease)   ? Gross hematuria   ? Internal hemorrhoids   ? Kidney stones   ? Low back pain   ? Lower abdominal pain   ? Monilial vaginitis   ? Nasal congestion   ? Shortness of breath dyspnea   ? Vaginitis   ? ? ?Past Surgical History:  ?Procedure Laterality Date  ? ABDOMINAL HYSTERECTOMY    ? still has ovaries   ? CYSTOSCOPY W/ RETROGRADES Bilateral 11/26/2014  ? Procedure: CYSTOSCOPY WITH RETROGRADE PYELOGRAM;  Surgeon: Hollice Espy, MD;  Location: [facility];  Service: Urology;  Laterality: Bilateral;  ? CYSTOSCOPY WITH STENT PLACEMENT Left 11/13/2014  ? Procedure: CYSTOSCOPY, left retrograde pyelogram WITH left ureteral STENT PLACEMENT;  Surgeon: Cleon Gustin, MD;  Location: [facility];  Service: Urology;  Laterality: Left;  ? CYSTOSCOPY/URETEROSCOPY/HOLMIUM LASER/STENT PLACEMENT Left  11/26/2014  ? Procedure: CYSTOSCOPY/URETEROSCOPY/HOLMIUM LASER/STENT PLACEMENT;  Surgeon: Hollice Espy, MD;  Location: [facility];  Service: Urology;  Laterality: Left;  ? NASAL SINUS SURGERY    ? TUBAL LIGATION    ? ? ?Family History  ?Problem Relation Age of Onset  ? Hyperlipidemia Mother   ? Hypertension Mother   ? Breast cancer Mother   ? Asthma Father   ? Prostate cancer Neg Hx   ? Kidney cancer Neg Hx   ? Bladder Cancer Neg Hx   ? ? ?Social History  ? ?Socioeconomic History  ? Marital status: Single  ?  Spouse name: Not on file  ? Number of children: Not on file  ? Years of education: Not on file  ? Highest education level: Not on file  ?Occupational History  ? Not on file  ?Tobacco Use  ? Smoking status: Every Day  ?  Packs/day: 1.00  ?  Years: 35.00  ?  Pack years: 35.00  ?  Types: Cigarettes  ? Smokeless tobacco: Never  ?Vaping Use  ? Vaping Use: Never used  ?Substance and Sexual Activity  ? Alcohol use: Yes  ?  Alcohol/week: 4.0 standard drinks  ?  Types: 4 Standard drinks or equivalent per week  ? Drug use: No  ? Sexual activity: Not Currently  ?Other Topics Concern  ? Not on file  ?Social History Narrative  ? Not on file  ? ?  Social Determinants of Health  ? ?Financial Resource Strain: Not on file  ?Food Insecurity: Not on file  ?Transportation Needs: Not on file  ?Physical Activity: Not on file  ?Stress: Not on file  ?Social Connections: Not on file  ?Intimate Partner Violence: Not on file  ? ? ?ROS ?Review of Systems  ?Constitutional:  Negative for chills, fever and unexpected weight change.  ?HENT:  Positive for postnasal drip. Negative for congestion.   ?Eyes:  Positive for discharge and itching. Negative for visual disturbance.  ?Respiratory:  Negative for cough and shortness of breath.   ?Cardiovascular:  Negative for chest pain.  ?Gastrointestinal:  Positive for abdominal pain. Negative for constipation, diarrhea, nausea and vomiting.  ?Endocrine: Positive for heat intolerance.   ?Psychiatric/Behavioral:  Positive for sleep disturbance.   ? ?Objective:  ? ?Today's Vitals: BP 122/84   Pulse (!) 105   Temp 98.2 ?F (36.8 ?C)   Resp 16   Ht '5\' 8"'$  (1.727 m)   Wt 184 lb 6.4 oz (83.6 kg)   SpO2 95%   BMI 28.04 kg/m?  ? ?Physical Exam ?Constitutional:   ?   Appearance: Normal appearance.  ?HENT:  ?   Head: Normocephalic and atraumatic.  ?   Mouth/Throat:  ?   Mouth: Mucous membranes are moist.  ?   Comments: PND present ?Eyes:  ?   Conjunctiva/sclera: Conjunctivae normal.  ?Cardiovascular:  ?   Rate and Rhythm: Normal rate and regular rhythm.  ?Pulmonary:  ?   Effort: Pulmonary effort is normal.  ?   Breath sounds: Normal breath sounds.  ?Musculoskeletal:  ?   Right lower leg: No edema.  ?   Left lower leg: No edema.  ?Skin: ?   General: Skin is warm and dry.  ?Neurological:  ?   General: No focal deficit present.  ?   Mental Status: She is alert. Mental status is at baseline.  ?Psychiatric:     ?   Mood and Affect: Mood normal.     ?   Behavior: Behavior normal.  ? ? ?Assessment & Plan:  ? ?Problem List Items Addressed This Visit   ? ?  ? Cardiovascular and Mediastinum  ? Hot flashes  ?  Educated on medication options for treatment, will try OTC black cohosh.  ?  ?  ? Relevant Orders  ? TSH  ?  ? Digestive  ? Gastroesophageal reflux disease - Primary  ?  Start Pantoprazole 40 mg daily, educated on lifestyle changes and avoiding trigger foods. Follow up in 1 month for recheck, may require repeat EGD. ?  ?  ? Relevant Medications  ? pantoprazole (PROTONIX) 40 MG tablet  ?  ? Other  ? History of kidney stones  ?  Reviewed CT scan and ED note as well as blood work from January. Continue to stay well hydrated, educated on further stone prevention.  ?  ?  ? Seasonal allergies  ?  Continue Flonase, try oral anti-histamine and anti-histamine eye drops to help with symptoms. Given a list of OTC medications that would help with symptoms.  ?  ?  ? ?Other Visit Diagnoses   ? ? Lipid screening      ?  Relevant Orders  ? Lipid Profile  ? Elevated glucose      ? Relevant Orders  ? COMPLETE METABOLIC PANEL WITH GFR  ? Screening for HIV without presence of risk factors      ? Relevant Orders  ? HIV antibody (with reflex)  ?  Encounter for hepatitis C screening test for low risk patient      ? Relevant Orders  ? Hepatitis C Antibody  ? Encounter for screening mammogram for malignant neoplasm of breast      ? Relevant Orders  ? MM DIAG BREAST TOMO BILATERAL  ? Screening for colon cancer      ? Relevant Orders  ? Ambulatory referral to Gastroenterology  ? ?  ? ? ?Outpatient Encounter Medications as of 04/17/2021  ?Medication Sig  ? fluticasone (FLONASE) 50 MCG/ACT nasal spray Place 2 sprays into both nostrils daily. (Patient taking differently: Place 2 sprays into both nostrils daily. OTC)  ? ketoconazole (NIZORAL) 2 % shampoo Apply 1 application topically See admin instructions. apply three times per week, massage into scalp and leave in for 10 minutes before rinsing out  ? omeprazole (PRILOSEC) 40 MG capsule Take 40 mg by mouth daily. OTC  ? triamcinolone lotion (KENALOG) 0.1 % Apply 1 application topically 2 (two) times daily. To itchy areas of scalp and ears. Can use for up to 2 weeks at ears.Avoid applying to face, groin, and axilla. Use as directed.  ? [DISCONTINUED] citalopram (CELEXA) 10 MG tablet Take 10 mg by mouth daily.  ? [DISCONTINUED] doxycycline (VIBRAMYCIN) 100 MG capsule Take 1 capsule (100 mg total) by mouth 2 (two) times daily.  ? [DISCONTINUED] estradiol (ESTRACE) 0.5 MG tablet Take by mouth.  ? [DISCONTINUED] meloxicam (MOBIC) 7.5 MG tablet TAKE 1 TABLET (7.5 MG TOTAL) BY MOUTH ONCE DAILY.  ? [DISCONTINUED] ondansetron (ZOFRAN) 4 MG tablet Take 1 tablet (4 mg total) by mouth every 8 (eight) hours as needed for nausea or vomiting.  ? [DISCONTINUED] ondansetron (ZOFRAN-ODT) 4 MG disintegrating tablet Take 1 tablet (4 mg total) by mouth every 8 (eight) hours as needed for nausea or vomiting.  ? ?No  facility-administered encounter medications on file as of 04/17/2021.  ? ? ?Follow-up: Return in about 4 weeks (around 05/15/2021) for recheck GERD.  ? ?Teodora Medici, DO ? ?

## 2021-04-17 NOTE — Patient Instructions (Addendum)
It was great seeing you today! ? ?Plan discussed at today's visit: ?-Blood work ordered today, results will be uploaded to [place].  ?-For hot flashes options include: black cohosh and Effexor (for moods and hot flashes - takes daily, not habit forming) ?-For allergies can do oral anti-histamines which include Zyrtec (may make sleepy), Claritin and Allegra. Can also try allergy eye drop called Zaditor  ?-Mammogram ordered, call number to schedule  ?-Referral placed to GI for colon cancer screening  ? ?Follow up in: 1 month  ? ?Take care and let us know if you have any questions or concerns prior to your next visit. ? ?Dr. Rosana Berger ? ?

## 2021-04-17 NOTE — Assessment & Plan Note (Signed)
Educated on medication options for treatment, will try OTC black cohosh.  ?

## 2021-04-20 LAB — COMPLETE METABOLIC PANEL WITH GFR
AG Ratio: 1.8 (calc) (ref 1.0–2.5)
ALT: 20 U/L (ref 6–29)
AST: 28 U/L (ref 10–35)
Albumin: 4.4 g/dL (ref 3.6–5.1)
Alkaline phosphatase (APISO): 113 U/L (ref 37–153)
BUN: 14 mg/dL (ref 7–25)
CO2: 25 mmol/L (ref 20–32)
Calcium: 9.7 mg/dL (ref 8.6–10.4)
Chloride: 103 mmol/L (ref 98–110)
Creat: 0.8 mg/dL (ref 0.50–1.03)
Globulin: 2.5 g/dL (calc) (ref 1.9–3.7)
Glucose, Bld: 97 mg/dL (ref 65–99)
Potassium: 4.4 mmol/L (ref 3.5–5.3)
Sodium: 139 mmol/L (ref 135–146)
Total Bilirubin: 0.4 mg/dL (ref 0.2–1.2)
Total Protein: 6.9 g/dL (ref 6.1–8.1)
eGFR: 86 mL/min/{1.73_m2} (ref >=60)

## 2021-04-20 LAB — LIPID PANEL
Cholesterol: 220 mg/dL — ABNORMAL HIGH (ref <200)
HDL: 59 mg/dL (ref >=50)
LDL Cholesterol (Calc): 125 mg/dL (calc) — ABNORMAL HIGH
Non-HDL Cholesterol (Calc): 161 mg/dL (calc) — ABNORMAL HIGH (ref <130)
Total CHOL/HDL Ratio: 3.7 (calc) (ref <5.0)
Triglycerides: 216 mg/dL — ABNORMAL HIGH (ref <150)

## 2021-04-20 LAB — HIV ANTIBODY (ROUTINE TESTING W REFLEX): HIV 1&2 Ab, 4th Generation: NONREACTIVE

## 2021-04-20 LAB — HEPATITIS C ANTIBODY
Hepatitis C Ab: NONREACTIVE
SIGNAL TO CUT-OFF: 0.13 (ref <1.00)

## 2021-04-20 LAB — TSH: TSH: 2.5 mIU/L (ref 0.40–4.50)

## 2021-04-22 ENCOUNTER — Telehealth: Payer: Self-pay

## 2021-04-22 NOTE — Telephone Encounter (Signed)
Pt has called back to schedule colonoscopy, pls call her at your earliest convenience ? ?

## 2021-04-22 NOTE — Telephone Encounter (Signed)
CALLED PATIENT NO ANSWER LEFT VOICEMAIL FOR A CALL BACK ? ?

## 2021-04-23 ENCOUNTER — Other Ambulatory Visit: Payer: Self-pay

## 2021-04-23 NOTE — Progress Notes (Signed)
Wants egds and colonoscopy at the same time so scheduled in person appointment ?

## 2021-05-28 NOTE — Progress Notes (Unsigned)
New Patient Office Visit  Subjective:  Patient ID: Brianna Huff, female    DOB: 10/17/1965  Age: 56 y.o. MRN: [id number]  CC:  No chief complaint on file.   HPI Brianna Huff presents for follow up on GERD.  GERD: -Having issues with increased gas, burping and burning epigastric pain -No nausea or vomiting but it does affect sleep sometimes -Currently on Omeprazole 40 mg once a day and zantac in the evening but still having symptoms. At [place] we started on Pantoprazole 40 mg. Today she states that ****  -Weight stable  Hx of kidney stones:  -History of stones twice, most recent in January, had to go to the ER. Multiple stones on CT, largest 3 mm, passed on their own. Tries to avoid tea and dark colas, tries to stay well hydrated.  Hot Flashes -Worse at night, at least once a night but during the day too  Seasonal Allergies: -Currently on Flonase -Complaining of watery, itching eyes, sneezing   Health Maintenance: -Blood work due -Mammogram due -Colon cancer screening: due  Past Medical History:  Diagnosis Date   Anxiety and depression    Cyst of left kidney    GERD (gastroesophageal reflux disease)    Gross hematuria    Internal hemorrhoids    Kidney stones    Low back pain    Lower abdominal pain    Monilial vaginitis    Nasal congestion    Shortness of breath dyspnea    Vaginitis     Past Surgical History:  Procedure Laterality Date   ABDOMINAL HYSTERECTOMY     still has ovaries    CYSTOSCOPY W/ RETROGRADES Bilateral 11/26/2014   Procedure: CYSTOSCOPY WITH RETROGRADE PYELOGRAM;  Surgeon: Hollice Espy, MD;  Location: [facility];  Service: Urology;  Laterality: Bilateral;   CYSTOSCOPY WITH STENT PLACEMENT Left 11/13/2014   Procedure: CYSTOSCOPY, left retrograde pyelogram WITH left ureteral STENT PLACEMENT;  Surgeon: Cleon Gustin, MD;  Location: [facility];  Service: Urology;  Laterality: Left;   CYSTOSCOPY/URETEROSCOPY/HOLMIUM LASER/STENT PLACEMENT  Left 11/26/2014   Procedure: CYSTOSCOPY/URETEROSCOPY/HOLMIUM LASER/STENT PLACEMENT;  Surgeon: Hollice Espy, MD;  Location: [facility];  Service: Urology;  Laterality: Left;   NASAL SINUS SURGERY     TUBAL LIGATION      Family History  Problem Relation Age of Onset   Hyperlipidemia Mother    Hypertension Mother    Breast cancer Mother    Asthma Father    Prostate cancer Neg Hx    Kidney cancer Neg Hx    Bladder Cancer Neg Hx     Social History   Socioeconomic History   Marital status: Single    Spouse name: Not on file   Number of children: Not on file   Years of education: Not on file   Highest education level: Not on file  Occupational History   Not on file  Tobacco Use   Smoking status: Every Day    Packs/day: 1.00    Years: 35.00    Pack years: 35.00    Types: Cigarettes   Smokeless tobacco: Never  Vaping Use   Vaping Use: Never used  Substance and Sexual Activity   Alcohol use: Yes    Alcohol/week: 4.0 standard drinks    Types: 4 Standard drinks or equivalent per week   Drug use: No   Sexual activity: Not Currently  Other Topics Concern   Not on file  Social History Narrative   Not on file   Social Determinants  of Health   Financial Resource Strain: Not on file  Food Insecurity: Not on file  Transportation Needs: Not on file  Physical Activity: Not on file  Stress: Not on file  Social Connections: Not on file  Intimate Partner Violence: Not on file    ROS Review of Systems  Constitutional:  Negative for chills, fever and unexpected weight change.  HENT:  Positive for postnasal drip. Negative for congestion.   Eyes:  Positive for discharge and itching. Negative for visual disturbance.  Respiratory:  Negative for cough and shortness of breath.   Cardiovascular:  Negative for chest pain.  Gastrointestinal:  Positive for abdominal pain. Negative for constipation, diarrhea, nausea and vomiting.  Endocrine: Positive for heat intolerance.   Psychiatric/Behavioral:  Positive for sleep disturbance.    Objective:   Today's Vitals: There were no vitals taken for this visit.  Physical Exam Constitutional:      Appearance: Normal appearance.  HENT:     Head: Normocephalic and atraumatic.     Mouth/Throat:     Mouth: Mucous membranes are moist.     Comments: PND present Eyes:     Conjunctiva/sclera: Conjunctivae normal.  Cardiovascular:     Rate and Rhythm: Normal rate and regular rhythm.  Pulmonary:     Effort: Pulmonary effort is normal.     Breath sounds: Normal breath sounds.  Musculoskeletal:     Right lower leg: No edema.     Left lower leg: No edema.  Skin:    General: Skin is warm and dry.  Neurological:     General: No focal deficit present.     Mental Status: She is alert. Mental status is at baseline.  Psychiatric:        Mood and Affect: Mood normal.        Behavior: Behavior normal.    Assessment & Plan:   Problem List Items Addressed This Visit   None  Outpatient Encounter Medications as of 05/29/2021  Medication Sig   fluticasone (FLONASE) 50 MCG/ACT nasal spray Place 2 sprays into both nostrils daily. (Patient taking differently: Place 2 sprays into both nostrils daily. OTC)   ketoconazole (NIZORAL) 2 % shampoo Apply 1 application topically See admin instructions. apply three times per week, massage into scalp and leave in for 10 minutes before rinsing out   pantoprazole (PROTONIX) 40 MG tablet Take 1 tablet (40 mg total) by mouth daily.   triamcinolone lotion (KENALOG) 0.1 % Apply 1 application topically 2 (two) times daily. To itchy areas of scalp and ears. Can use for up to 2 weeks at ears.Avoid applying to face, groin, and axilla. Use as directed.   No facility-administered encounter medications on file as of 05/29/2021.    Follow-up: No follow-ups on file.   Teodora Medici, DO

## 2021-05-29 ENCOUNTER — Ambulatory Visit: Payer: Managed Care, Other (non HMO) | Admitting: Internal Medicine

## 2021-06-02 ENCOUNTER — Other Ambulatory Visit: Payer: Self-pay

## 2021-06-02 DIAGNOSIS — K219 Gastro-esophageal reflux disease without esophagitis: Secondary | ICD-10-CM

## 2021-06-02 MED ORDER — PANTOPRAZOLE SODIUM 40 MG PO TBEC: 40.0000 mg | DELAYED_RELEASE_TABLET | Freq: Every day | ORAL | 3 refills | Status: DC

## 2021-06-02 NOTE — Telephone Encounter (Signed)
New pharmacy

## 2021-07-01 ENCOUNTER — Ambulatory Visit: Payer: Managed Care, Other (non HMO) | Admitting: Physician Assistant

## 2021-07-01 ENCOUNTER — Encounter: Payer: Self-pay | Admitting: Physician Assistant

## 2021-07-01 VITALS — BP 112/80 | HR 100 | Resp 16 | Ht 68.0 in | Wt 177.0 lb

## 2021-07-01 DIAGNOSIS — F17210 Nicotine dependence, cigarettes, uncomplicated: Secondary | ICD-10-CM

## 2021-07-01 DIAGNOSIS — R062 Wheezing: Secondary | ICD-10-CM | POA: Diagnosis not present

## 2021-07-01 DIAGNOSIS — J069 Acute upper respiratory infection, unspecified: Secondary | ICD-10-CM

## 2021-07-01 MED ORDER — ALBUTEROL SULFATE HFA 108 (90 BASE) MCG/ACT IN AERS: 2.0000 | INHALATION_SPRAY | Freq: Four times a day (QID) | RESPIRATORY_TRACT | 0 refills | Status: DC | PRN

## 2021-07-01 NOTE — Progress Notes (Signed)
Acute Office Visit   Patient: Brianna Huff   DOB: March 10, 1965   56 y.o. Female  MRN: [id number] Visit Date: 07/01/2021  Today's healthcare provider: Dani Gobble Adryan Shin, PA-C  Introduced myself to the patient as a Journalist, newspaper and provided education on APPs in clinical practice.    Chief Complaint  Patient presents with   Sinusitis    X3 days   Cough    X2 weeks   Subjective    HPI HPI     Sinusitis    Additional comments: X3 days        Cough    Additional comments: X2 weeks      Last edited by Carlene Coria, CMA on 07/01/2021 11:26 AM.        Reports she has had a cough for the past 2 weeks Then three days ago started to have symptoms of nasal congestion, sinus pressure, sore throat,ear pressure Denies fever, chills, sweats,  Reports she fees like the congestion finally started to break up this morning and she produced a lot of mucus from her nose She reports intermittent productive coughing, mostly dry Reports from the neck up she feels like her muscles are stiff and tight  Interventions: Aleve, Flonase - limited relief Aggravating: nothing Alleviating: nothing  Reviewed previous O2 sat results - appears pt is typically at 95% on previous apt     Medications: Outpatient Medications Prior to Visit  Medication Sig   BLACK COHOSH PO Take 2 tablets by mouth daily.   fluticasone (FLONASE) 50 MCG/ACT nasal spray Place 2 sprays into both nostrils daily. (Patient taking differently: Place 2 sprays into both nostrils daily. OTC)   ketoconazole (NIZORAL) 2 % shampoo Apply 1 application topically See admin instructions. apply three times per week, massage into scalp and leave in for 10 minutes before rinsing out   pantoprazole (PROTONIX) 40 MG tablet Take 1 tablet (40 mg total) by mouth daily.   triamcinolone lotion (KENALOG) 0.1 % Apply 1 application topically 2 (two) times daily. To itchy areas of scalp and ears. Can use for up to 2 weeks at ears.Avoid applying to  face, groin, and axilla. Use as directed.   No facility-administered medications prior to visit.    Review of Systems  Constitutional:  Negative for chills, diaphoresis and fever.  HENT:  Positive for congestion, postnasal drip, sinus pressure and sore throat. Negative for ear pain and rhinorrhea.   Respiratory:  Positive for cough and shortness of breath. Negative for wheezing.   Gastrointestinal:  Negative for diarrhea, nausea and vomiting.  Musculoskeletal:  Negative for arthralgias and myalgias.  Neurological:  Negative for dizziness, light-headedness and headaches.       Objective    BP 112/80   Pulse 100   Resp 16   Ht '5\' 8"'$  (1.727 m)   Wt 177 lb (80.3 kg)   SpO2 94%   BMI 26.91 kg/m    Physical Exam Vitals reviewed.  Constitutional:      General: She is awake.     Appearance: Normal appearance. She is well-developed, well-groomed and overweight.  HENT:     Head: Normocephalic and atraumatic.     Right Ear: Hearing, tympanic membrane and ear canal normal.     Left Ear: Hearing, tympanic membrane and ear canal normal.     Nose:     Right Turbinates: Not enlarged or swollen.     Left Turbinates: Not enlarged or swollen.  Right Sinus: Maxillary sinus tenderness and frontal sinus tenderness present.     Left Sinus: Maxillary sinus tenderness and frontal sinus tenderness present.     Mouth/Throat:     Pharynx: Uvula midline. Posterior oropharyngeal erythema present. No pharyngeal swelling, oropharyngeal exudate or uvula swelling.  Cardiovascular:     Rate and Rhythm: Normal rate and regular rhythm.     Pulses: Normal pulses.          Radial pulses are 2+ on the right side and 2+ on the left side.     Heart sounds: Normal heart sounds.  Pulmonary:     Effort: Pulmonary effort is normal.     Breath sounds: No decreased air movement. Examination of the right-upper field reveals wheezing. Examination of the left-upper field reveals wheezing. Examination of the  right-middle field reveals wheezing. Examination of the left-middle field reveals wheezing. Wheezing present. No decreased breath sounds, rhonchi or rales.  Musculoskeletal:     Right lower leg: No edema.     Left lower leg: No edema.  Lymphadenopathy:     Head:     Right side of head: No submental or submandibular adenopathy.     Left side of head: No submental or submandibular adenopathy.     Upper Body:     Right upper body: No supraclavicular adenopathy.     Left upper body: No supraclavicular adenopathy.  Neurological:     Mental Status: She is alert.  Psychiatric:        Attention and Perception: Attention normal.        Mood and Affect: Mood normal.        Speech: Speech normal.        Behavior: Behavior normal. Behavior is cooperative.       No results found for any visits on 07/01/21.  Assessment & Plan      No follow-ups on file.      Problem List Items Addressed This Visit       Other   Cigarette smoker    Reviewed smoking and impact this has on health Provided smoking cessation materials Patient seems open to trying to quit today. Discussed a gradual taper of cigarette use over several weeks  Provided options for nicotine replacement once she is done smoking Follow up as needed for further assistance.       Other Visit Diagnoses     Upper respiratory tract infection, unspecified type    -  Primary Visit with patient indicates symptoms comprised of congestion, sore throat, coughing, sinus pressure for approx 3 days congruent with acute URI that is likely viral in nature  Offered COVID testing today- patient declined  Offered tessalon pearls to assist with cough- patient declined saying they are not beneficial to her Due to nature and duration of symptoms recommended treatment regimen is symptomatic relief and follow up if needed Discussed with patient the various viral and bacterial etiologies of current illness and appropriate course of  treatment Discussed OTC medication options for multisymptom relief such as Dayquil/Nyquil, Theraflu, AlkaSeltzer, etc. Discussed return precautions if symptoms are not improving or worsen over next 5-7 days.     Wheezing     Unsure if chronic or acute due to URI symptoms Pt is chronic smoker and I am unsure of baseline lung function  Will provide Albuterol inhaler to assist with wheezing and coughing  Discussed that controller inhaler may be needed if albuterol use is frequent (>3 x per day for 1-2 weeks) Recommend  follow up as needed for persistent or progressing symptoms.     Relevant Medications   albuterol (VENTOLIN HFA) 108 (90 Base) MCG/ACT inhaler        No follow-ups on file.   I, Merian Wroe E Marilee Ditommaso, PA-C, have reviewed all documentation for this visit. The documentation on 07/01/21 for the exam, diagnosis, procedures, and orders are all accurate and complete.   Talitha Givens, MHS, PA-C [facility]

## 2021-07-01 NOTE — Patient Instructions (Addendum)
Based on your described symptoms and the duration of symptoms it is likely that you have a viral upper respiratory infection (often called a "cold")  Symptoms can last for 3-10 days with lingering cough and intermittent symptoms lasting weeks after that.  The goal of treatment at this time is to reduce your symptoms and discomfort   I am going to send in an inhaler for you to use to help with your breathing and coughing as well. You can use this as needed up to every 6 hours.   You can use over the counter medications such as Dayquil/Nyquil, AlkaSeltzer formulations, etc to provide further relief of symptoms according to the manufacturer's instructions  You can use Mucinex if preferred to help thin out the mucus secretions- it is recommended when you have an illness like this to stay well hydrated and increase your water consumption. I usually recommend drinking about 75 oz of water per day during this time. This will help improve the Mucinex action as well.    If your symptoms do not improve or become worse in the next 5-7 days please make an apt at the office so we can see you  Go to the ER if you begin to have more serious symptoms such as shortness of breath, trouble breathing, loss of consciousness, swelling around the eyes, high fever, severe lasting headaches, vision changes or neck pain/stiffness.

## 2021-07-01 NOTE — Assessment & Plan Note (Signed)
Reviewed smoking and impact this has on health Provided smoking cessation materials Patient seems open to trying to quit today. Discussed a gradual taper of cigarette use over several weeks  Provided options for nicotine replacement once she is done smoking Follow up as needed for further assistance.

## 2021-07-03 ENCOUNTER — Ambulatory Visit: Payer: Managed Care, Other (non HMO) | Admitting: Physician Assistant

## 2021-07-08 ENCOUNTER — Other Ambulatory Visit: Payer: Self-pay | Admitting: Internal Medicine

## 2021-07-08 DIAGNOSIS — K219 Gastro-esophageal reflux disease without esophagitis: Secondary | ICD-10-CM

## 2021-07-08 NOTE — Telephone Encounter (Signed)
Pt called to follow up on Rx Refill request to [facility] in [place]

## 2021-07-09 NOTE — Telephone Encounter (Signed)
Requested Prescriptions  Pending Prescriptions Disp Refills  . pantoprazole (PROTONIX) 40 MG tablet [Pharmacy Med Name: PANTOPRAZOLE SOD DR 40 MG TAB] 90 tablet 1    Sig: TAKE 1 TABLET BY MOUTH EVERY DAY     Gastroenterology: Proton Pump Inhibitors Passed - 07/08/2021  3:42 PM      Passed - Valid encounter within last 12 months    Recent Outpatient Visits          1 week ago Upper respiratory tract infection, unspecified type   [place], PA-C   2 months ago Gastroesophageal reflux disease, unspecified whether esophagitis present   [place], [place]

## 2021-07-25 ENCOUNTER — Other Ambulatory Visit: Payer: Self-pay | Admitting: Physician Assistant

## 2021-07-25 DIAGNOSIS — R062 Wheezing: Secondary | ICD-10-CM

## 2021-07-27 NOTE — Telephone Encounter (Signed)
Requested medication (s) are due for refill today - unsure  Requested medication (s) are on the active medication list -yes  Future visit scheduled -no  Last refill: 07/01/21 8g  Notes to clinic: Provider review- Rx for acute visit  Requested Prescriptions  Pending Prescriptions Disp Refills   albuterol (VENTOLIN HFA) 108 (90 Base) MCG/ACT inhaler [Pharmacy Med Name: ALBUTEROL HFA (PROAIR) INHALER] 8.5 each     Sig: TAKE 2 PUFFS BY MOUTH EVERY 6 HOURS AS NEEDED FOR WHEEZE OR SHORTNESS OF BREATH     Pulmonology:  Beta Agonists 2 Passed - 07/25/2021  8:38 AM      Passed - Last BP in normal range    BP Readings from Last 1 Encounters:  07/01/21 112/80         Passed - Last Heart Rate in normal range    Pulse Readings from Last 1 Encounters:  07/01/21 100         Passed - Valid encounter within last 12 months    Recent Outpatient Visits           3 weeks ago Upper respiratory tract infection, unspecified type   [place], [place], PA-C   3 months ago Gastroesophageal reflux disease, unspecified whether esophagitis present   [place], DO                 Requested Prescriptions  Pending Prescriptions Disp Refills   albuterol (VENTOLIN HFA) 108 (90 Base) MCG/ACT inhaler [Pharmacy Med Name: ALBUTEROL HFA (PROAIR) INHALER] 8.5 each     Sig: TAKE 2 PUFFS BY MOUTH EVERY 6 HOURS AS NEEDED FOR WHEEZE OR SHORTNESS OF BREATH     Pulmonology:  Beta Agonists 2 Passed - 07/25/2021  8:38 AM      Passed - Last BP in normal range    BP Readings from Last 1 Encounters:  07/01/21 112/80         Passed - Last Heart Rate in normal range    Pulse Readings from Last 1 Encounters:  07/01/21 100         Passed - Valid encounter within last 12 months    Recent Outpatient Visits           3 weeks ago Upper respiratory tract infection, unspecified type   [place], PA-C   3 months  ago Gastroesophageal reflux disease, unspecified whether esophagitis present   [place], [place]

## 2021-08-11 ENCOUNTER — Encounter: Payer: Self-pay | Admitting: Gastroenterology

## 2021-08-11 ENCOUNTER — Ambulatory Visit: Payer: Managed Care, Other (non HMO) | Admitting: Gastroenterology

## 2021-08-11 VITALS — BP 119/82 | HR 99 | Temp 98.4°F | Ht 68.0 in | Wt 178.0 lb

## 2021-08-11 DIAGNOSIS — Z1211 Encounter for screening for malignant neoplasm of colon: Secondary | ICD-10-CM

## 2021-08-11 DIAGNOSIS — K219 Gastro-esophageal reflux disease without esophagitis: Secondary | ICD-10-CM

## 2021-08-11 MED ORDER — NA SULFATE-K SULFATE-MG SULF 17.5-3.13-1.6 GM/177ML PO SOLN: 1.0000 | Freq: Once | ORAL | 0 refills | Status: AC

## 2021-08-11 NOTE — Progress Notes (Signed)
Gastroenterology Consultation  Referring Provider:     Teodora Medici, DO Primary Care Physician:  Teodora Medici, DO Primary Gastroenterologist:  Dr. Allen Norris     Reason for Consultation:     Reflux        HPI:   Brianna Huff is a 56 y.o. y/o female referred for consultation & management of Reflux by Dr. Teodora Medici, DO.  This patient comes in today with a report of needing a screening colonoscopy.  The patient also states that she is been having long-standing reflux with most of her symptoms at night.  The patient takes her PPI in the morning and usually has acid breakthrough at night with acid coming up into her mouth.  There is no report of any unexplained weight loss fevers chills nausea vomiting black stools or bloody stools.  The patient also denies any food getting stuck when having to bring the food back up.  The patient's CBC 6 months ago was normal and she has no sign of any anemia.  Past Medical History:  Diagnosis Date   Anxiety and depression    Cyst of left kidney    GERD (gastroesophageal reflux disease)    Gross hematuria    Internal hemorrhoids    Kidney stones    Low back pain    Lower abdominal pain    Monilial vaginitis    Nasal congestion    Shortness of breath dyspnea    Vaginitis     Past Surgical History:  Procedure Laterality Date   ABDOMINAL HYSTERECTOMY     still has ovaries    CYSTOSCOPY W/ RETROGRADES Bilateral 11/26/2014   Procedure: CYSTOSCOPY WITH RETROGRADE PYELOGRAM;  Surgeon: Hollice Espy, MD;  Location: [facility];  Service: Urology;  Laterality: Bilateral;   CYSTOSCOPY WITH STENT PLACEMENT Left 11/13/2014   Procedure: CYSTOSCOPY, left retrograde pyelogram WITH left ureteral STENT PLACEMENT;  Surgeon: Cleon Gustin, MD;  Location: [facility];  Service: Urology;  Laterality: Left;   CYSTOSCOPY/URETEROSCOPY/HOLMIUM LASER/STENT PLACEMENT Left 11/26/2014   Procedure: CYSTOSCOPY/URETEROSCOPY/HOLMIUM LASER/STENT PLACEMENT;   Surgeon: Hollice Espy, MD;  Location: [facility];  Service: Urology;  Laterality: Left;   NASAL SINUS SURGERY     TUBAL LIGATION      Prior to Admission medications   Medication Sig Start Date End Date Taking? Authorizing Provider  albuterol (VENTOLIN HFA) 108 (90 Base) MCG/ACT inhaler TAKE 2 PUFFS BY MOUTH EVERY 6 HOURS AS NEEDED FOR WHEEZE OR SHORTNESS OF BREATH 07/29/21  Yes Bo Merino, FNP  fluticasone (FLONASE) 50 MCG/ACT nasal spray Place 2 sprays into both nostrils daily. Patient taking differently: Place 2 sprays into both nostrils daily. OTC 01/11/21  Yes Covington, Sarah M, PA-C  ketoconazole (NIZORAL) 2 % shampoo Apply 1 application topically See admin instructions. apply three times per week, massage into scalp and leave in for 10 minutes before rinsing out 10/09/20  Yes Moye, [place], MD  pantoprazole (PROTONIX) 40 MG tablet TAKE 1 TABLET BY MOUTH EVERY DAY 07/09/21  Yes Teodora Medici, DO  triamcinolone lotion (KENALOG) 0.1 % Apply 1 application topically 2 (two) times daily. To itchy areas of scalp and ears. Can use for up to 2 weeks at ears.Avoid applying to face, groin, and axilla. Use as directed. 10/09/20  Yes Moye, [place], MD    Family History  Problem Relation Age of Onset   Hyperlipidemia Mother    Hypertension Mother    Breast cancer Mother    Asthma Father    Prostate cancer  Neg Hx    Kidney cancer Neg Hx    Bladder Cancer Neg Hx      Social History   Tobacco Use   Smoking status: Every Day    Packs/day: 1.00    Years: 35.00    Total pack years: 35.00    Types: Cigarettes   Smokeless tobacco: Never  Vaping Use   Vaping Use: Never used  Substance Use Topics   Alcohol use: Yes    Alcohol/week: 4.0 standard drinks of alcohol    Types: 4 Standard drinks or equivalent per week   Drug use: No    Allergies as of 08/11/2021 - Review Complete 08/11/2021  Allergen Reaction Noted   2,4-d dimethylamine Shortness Of Breath 11/05/2014   Sulfa antibiotics  Itching and Shortness Of Breath 11/05/2014   Azithromycin Other (See Comments) 07/30/2016   Penicillin g Other (See Comments) 11/05/2014   Omnicef [cefdinir] Itching and Palpitations 11/14/2014    Review of Systems:    All systems reviewed and negative except where noted in HPI.   Physical Exam:  BP 119/82   Pulse 99   Temp 98.4 F (36.9 C) (Oral)   Ht '5\' 8"'$  (1.727 m)   Wt 178 lb (80.7 kg)   BMI 27.06 kg/m  No LMP recorded. Patient has had a hysterectomy. General:   Alert,  Well-developed, well-nourished, pleasant and cooperative in NAD Head:  Normocephalic and atraumatic. Eyes:  Sclera clear, no icterus.   Conjunctiva pink. Ears:  Normal auditory acuity. Neck:  Supple; no masses or thyromegaly. Lungs:  Respirations even and unlabored.  Clear throughout to auscultation.   No wheezes, crackles, or rhonchi. No acute distress. Heart:  Regular rate and rhythm; no murmurs, clicks, rubs, or gallops. Abdomen:  Normal bowel sounds.  No bruits.  Soft, non-tender and non-distended without masses, hepatosplenomegaly or hernias noted.  No guarding or rebound tenderness.  Negative Carnett sign.   Rectal:  Deferred.  Pulses:  Normal pulses noted. Extremities:  No clubbing or edema.  No cyanosis. Neurologic:  Alert and oriented x3;  grossly normal neurologically. Skin:  Intact without significant lesions or rashes.  No jaundice. Lymph Nodes:  No significant cervical adenopathy. Psych:  Alert and cooperative. Normal mood and affect.  Imaging Studies: No results found.  Assessment and Plan:   Brianna Huff is a 56 y.o. y/o female  who comes in today with a history of reflux and in need of a screening colonoscopy.  The patient will be set up for a colonoscopy and EGD due to her need for screening and for her long-standing heartburn.  The patient has also been told to take the Protonix in the evening since most of her acid breakthrough is when she is laying down and she is presently taking the  medication the morning.  The patient will start the medication at night and follow-up of the time of EGD and colonoscopy.  The patient has been explained the plan and agrees with it    Lucilla Lame, MD. Marval Regal    Note: This dictation was prepared with Dragon dictation along with smaller phrase technology. Any transcriptional errors that result from this process are unintentional.

## 2021-08-17 NOTE — Anesthesia Preprocedure Evaluation (Addendum)
Anesthesia Evaluation  Patient identified by MRN, date of birth, ID band Patient awake    Reviewed: Allergy & Precautions, NPO status , Patient's Chart, lab work & pertinent test results  Airway Mallampati: II  TM Distance: >3 FB Neck ROM: full    Dental no notable dental hx.    Pulmonary shortness of breath and with exertion, Current SmokerPatient did not abstain from smoking.,    Pulmonary exam normal        Cardiovascular negative cardio ROS Normal cardiovascular exam     Neuro/Psych negative neurological ROS  negative psych ROS   GI/Hepatic Neg liver ROS, GERD  ,  Endo/Other  negative endocrine ROS  Renal/GU negative Renal ROS  negative genitourinary   Musculoskeletal   Abdominal Normal abdominal exam  (+)   Peds  Hematology negative hematology ROS (+)   Anesthesia Other Findings Past Medical History: No date: Anxiety and depression No date: Cyst of left kidney No date: GERD (gastroesophageal reflux disease) No date: Gross hematuria No date: Internal hemorrhoids No date: Kidney stones No date: Low back pain No date: Lower abdominal pain No date: Monilial vaginitis No date: Nasal congestion No date: Shortness of breath dyspnea No date: Vaginitis  Past Surgical History: No date: ABDOMINAL HYSTERECTOMY     Comment:  still has ovaries  11/26/2014: CYSTOSCOPY W/ RETROGRADES; Bilateral     Comment:  Procedure: CYSTOSCOPY WITH RETROGRADE PYELOGRAM;                Surgeon: Hollice Espy, MD;  Location: [facility];                Service: Urology;  Laterality: Bilateral; 11/13/2014: CYSTOSCOPY WITH STENT PLACEMENT; Left     Comment:  Procedure: CYSTOSCOPY, left retrograde pyelogram WITH               left ureteral STENT PLACEMENT;  Surgeon: Cleon Gustin, MD;  Location: [facility];  Service: Urology;                Laterality: Left; 11/26/2014: CYSTOSCOPY/URETEROSCOPY/HOLMIUM LASER/STENT  PLACEMENT;  Left     Comment:  Procedure: CYSTOSCOPY/URETEROSCOPY/HOLMIUM LASER/STENT               PLACEMENT;  Surgeon: Hollice Espy, MD;  Location: [facility]               ORS;  Service: Urology;  Laterality: Left; No date: NASAL SINUS SURGERY No date: TUBAL LIGATION     Reproductive/Obstetrics negative OB ROS                            Anesthesia Physical Anesthesia Plan  ASA: 2  Anesthesia Plan: General   Post-op Pain Management: Minimal or no pain anticipated   Induction: Intravenous  PONV Risk Score and Plan: Propofol infusion, TIVA and Treatment may vary due to age or medical condition  Airway Management Planned: Natural Airway and Nasal Cannula  Additional Equipment:   Intra-op Plan:   Post-operative Plan:   Informed Consent: I have reviewed the patients History and Physical, chart, labs and discussed the procedure including the risks, benefits and alternatives for the proposed anesthesia with the patient or authorized representative who has indicated his/her understanding and acceptance.     Dental Advisory Given  Plan Discussed with: Anesthesiologist, CRNA and Surgeon  Anesthesia Plan Comments:  Anesthesia Quick Evaluation  

## 2021-08-18 ENCOUNTER — Encounter: Payer: Self-pay | Admitting: Gastroenterology

## 2021-08-21 ENCOUNTER — Encounter
Admission: RE | Disposition: A | Discharge status: Home / Self Care | Payer: Self-pay | Source: Home / Self Care | Attending: Gastroenterology

## 2021-08-21 ENCOUNTER — Ambulatory Visit
Admission: RE | Admit: 2021-08-21 | Discharge: 2021-08-21 | Disposition: A | Discharge status: Home / Self Care | Payer: Managed Care, Other (non HMO) | Attending: Gastroenterology | Admitting: Gastroenterology

## 2021-08-21 ENCOUNTER — Ambulatory Visit: Payer: Managed Care, Other (non HMO) | Admitting: Anesthesiology

## 2021-08-21 ENCOUNTER — Encounter: Payer: Self-pay | Admitting: Gastroenterology

## 2021-08-21 ENCOUNTER — Other Ambulatory Visit: Payer: Self-pay

## 2021-08-21 DIAGNOSIS — K219 Gastro-esophageal reflux disease without esophagitis: Secondary | ICD-10-CM | POA: Insufficient documentation

## 2021-08-21 DIAGNOSIS — K640 First degree hemorrhoids: Secondary | ICD-10-CM | POA: Insufficient documentation

## 2021-08-21 DIAGNOSIS — Z1211 Encounter for screening for malignant neoplasm of colon: Secondary | ICD-10-CM

## 2021-08-21 DIAGNOSIS — K5730 Diverticulosis of large intestine without perforation or abscess without bleeding: Secondary | ICD-10-CM

## 2021-08-21 DIAGNOSIS — Z90710 Acquired absence of both cervix and uterus: Secondary | ICD-10-CM | POA: Diagnosis not present

## 2021-08-21 DIAGNOSIS — K635 Polyp of colon: Secondary | ICD-10-CM

## 2021-08-21 DIAGNOSIS — K449 Diaphragmatic hernia without obstruction or gangrene: Secondary | ICD-10-CM | POA: Diagnosis not present

## 2021-08-21 DIAGNOSIS — D125 Benign neoplasm of sigmoid colon: Secondary | ICD-10-CM | POA: Diagnosis not present

## 2021-08-21 HISTORY — PX: ESOPHAGOGASTRODUODENOSCOPY: SHX5428

## 2021-08-21 HISTORY — PX: POLYPECTOMY: SHX5525

## 2021-08-21 HISTORY — PX: COLONOSCOPY: SHX5424

## 2021-08-21 HISTORY — DX: Other specified postprocedural states: Z98.890

## 2021-08-21 HISTORY — DX: Other specified postprocedural states: R11.2

## 2021-08-21 SURGERY — COLONOSCOPY
Anesthesia: General | Site: Rectum

## 2021-08-21 MED ORDER — GLYCOPYRROLATE PF 0.2 MG/ML IJ SOSY
PREFILLED_SYRINGE | INTRAMUSCULAR | Status: DC | PRN
  Administered 2021-08-21: .1 mg via INTRAVENOUS

## 2021-08-21 MED ORDER — LIDOCAINE HCL (CARDIAC) PF 100 MG/5ML IV SOSY
PREFILLED_SYRINGE | INTRAVENOUS | Status: DC | PRN
  Administered 2021-08-21: 40 mg via INTRAVENOUS

## 2021-08-21 MED ORDER — PROPOFOL 10 MG/ML IV BOLUS
INTRAVENOUS | Status: DC | PRN
  Administered 2021-08-21 (×3): 50 mg via INTRAVENOUS
  Administered 2021-08-21: 20 mg via INTRAVENOUS
  Administered 2021-08-21: 50 mg via INTRAVENOUS
  Administered 2021-08-21: 40 mg via INTRAVENOUS
  Administered 2021-08-21 (×2): 50 mg via INTRAVENOUS

## 2021-08-21 MED ORDER — STERILE WATER FOR IRRIGATION IR SOLN
Status: DC | PRN
  Administered 2021-08-21: 150 mL

## 2021-08-21 MED ORDER — LACTATED RINGERS IV SOLN: INTRAVENOUS | Status: DC

## 2021-08-21 MED ORDER — SODIUM CHLORIDE 0.9 % IV SOLN: INTRAVENOUS | Status: DC

## 2021-08-21 SURGICAL SUPPLY — 36 items
BALLN DILATOR 12-15 8 (BALLOONS)
BALLN DILATOR 15-18 8 (BALLOONS)
BALLN DILATOR CRE 0-12 8 (BALLOONS)
BALLN DILATOR ESOPH 8 10 CRE (MISCELLANEOUS) IMPLANT
BALLOON DILATOR 12-15 8 (BALLOONS) IMPLANT
BALLOON DILATOR 15-18 8 (BALLOONS) IMPLANT
BALLOON DILATOR CRE 0-12 8 (BALLOONS) IMPLANT
BLOCK BITE 60FR ADLT L/F GRN (MISCELLANEOUS) ×3 IMPLANT
CLIP HMST 235XBRD CATH ROT (MISCELLANEOUS) IMPLANT
CLIP RESOLUTION 360 11X235 (MISCELLANEOUS)
ELECT REM PT RETURN 9FT ADLT (ELECTROSURGICAL)
ELECTRODE REM PT RTRN 9FT ADLT (ELECTROSURGICAL) IMPLANT
FCP ESCP3.2XJMB 240X2.8X (MISCELLANEOUS)
FORCEPS BIOP RAD 4 LRG CAP 4 (CUTTING FORCEPS) IMPLANT
FORCEPS BIOP RJ4 240 W/NDL (MISCELLANEOUS)
FORCEPS ESCP3.2XJMB 240X2.8X (MISCELLANEOUS) IMPLANT
GOWN CVR UNV OPN BCK APRN NK (MISCELLANEOUS) ×12 IMPLANT
GOWN ISOL THUMB LOOP REG UNIV (MISCELLANEOUS) ×12
INJECTOR VARIJECT VIN23 (MISCELLANEOUS) IMPLANT
KIT DEFENDO VALVE AND CONN (KITS) IMPLANT
KIT PRC NS LF DISP ENDO (KITS) ×6 IMPLANT
KIT PROCEDURE OLYMPUS (KITS) ×6
MANIFOLD NEPTUNE II (INSTRUMENTS) ×6 IMPLANT
MARKER SPOT ENDO TATTOO 5ML (MISCELLANEOUS) IMPLANT
PROBE APC STR FIRE (PROBE) IMPLANT
RETRIEVER NET PLAT FOOD (MISCELLANEOUS) IMPLANT
RETRIEVER NET ROTH 2.5X230 LF (MISCELLANEOUS) IMPLANT
SNARE COLD EXACTO (MISCELLANEOUS) IMPLANT
SNARE SHORT THROW 13M SML OVAL (MISCELLANEOUS) IMPLANT
SNARE SHORT THROW 30M LRG OVAL (MISCELLANEOUS) IMPLANT
SNARE SNG USE RND 15MM (INSTRUMENTS) IMPLANT
SYR INFLATION 60ML (SYRINGE) IMPLANT
TRAP ETRAP POLY (MISCELLANEOUS) IMPLANT
VARIJECT INJECTOR VIN23 (MISCELLANEOUS)
WATER STERILE IRR 250ML POUR (IV SOLUTION) ×6 IMPLANT
WIRE CRE 18-20MM 8CM F G (MISCELLANEOUS) IMPLANT

## 2021-08-21 NOTE — Transfer of Care (Signed)
Immediate Anesthesia Transfer of Care Note  Patient: Brianna Huff  Procedure(s) Performed: COLONOSCOPY (Rectum) ESOPHAGOGASTRODUODENOSCOPY (EGD) (Mouth) POLYPECTOMY  Patient Location: PACU  Anesthesia Type: General  Level of Consciousness: awake, alert  and patient cooperative  Airway and Oxygen Therapy: Patient Spontanous Breathing and Patient connected to supplemental oxygen  Post-op Assessment: Post-op Vital signs reviewed, Patient's Cardiovascular Status Stable, Respiratory Function Stable, Patent Airway and No signs of Nausea or vomiting  Post-op Vital Signs: Reviewed and stable  Complications: There were no known notable events for this encounter.

## 2021-08-21 NOTE — Op Note (Signed)
[facility] Gastroenterology Patient Name: Brianna Huff Procedure Date: 08/21/2021 10:42 AM MRN: [id number] Account #: [id number] Date of Birth: 10-27-1965 Admit Type: Outpatient Age: 56 Room: [facility] OR ROOM 1 Gender: Female Note Status: Finalized Instrument Name: [id number] Procedure:             Upper GI endoscopy Indications:           Heartburn Providers:             Lucilla Lame MD, MD Medicines:             Propofol per Anesthesia Complications:         No immediate complications. Procedure:             Pre-Anesthesia Assessment:                        - Prior to the procedure, a History and Physical was                         performed, and patient medications and allergies were                         reviewed. The patient's tolerance of previous                         anesthesia was also reviewed. The risks and benefits                         of the procedure and the sedation options and risks                         were discussed with the patient. All questions were                         answered, and informed consent was obtained. Prior                         Anticoagulants: The patient has taken no previous                         anticoagulant or antiplatelet agents. ASA Grade                         Assessment: II - A patient with mild systemic disease.                         After reviewing the risks and benefits, the patient                         was deemed in satisfactory condition to undergo the                         procedure.                        After obtaining informed consent, the endoscope was                         passed under direct vision. Throughout the procedure,  the patient's blood pressure, pulse, and oxygen                         saturations were monitored continuously. The Endoscope                         was introduced through the mouth, and advanced to the                         second part  of duodenum. The upper GI endoscopy was                         accomplished without difficulty. The patient tolerated                         the procedure well. Findings:      A small hiatal hernia was present.      The stomach was normal.      The examined duodenum was normal. Impression:            - Small hiatal hernia.                        - Normal stomach.                        - Normal examined duodenum.                        - No specimens collected. Recommendation:        - Discharge patient to home.                        - Resume previous diet.                        - Continue present medications. Procedure Code(s):     --- Professional ---                        [phone], Esophagogastroduodenoscopy, flexible,                         transoral; diagnostic, including collection of                         specimen(s) by brushing or washing, when performed                         (separate procedure) Diagnosis Code(s):     --- Professional ---                        R12, Heartburn CPT copyright 2019 American Medical Association. All rights reserved. The codes documented in this report are preliminary and upon coder review may  be revised to meet current compliance requirements. Lucilla Lame MD, MD 08/21/2021 10:55:14 AM This report has been signed electronically. Number of Addenda: 0 Note Initiated On: 08/21/2021 10:42 AM Estimated Blood Loss:  Estimated blood loss: none.      [facility]

## 2021-08-21 NOTE — H&P (Signed)
Lucilla Lame, MD [facility] [address], [place], [place] [address] [phone] Fax : [phone]  Primary Care Physician:  Teodora Medici, DO Primary Gastroenterologist:  Dr. Allen Norris  Pre-Procedure History & Physical: HPI:  Brianna Huff is a 56 y.o. female is here for an endoscopy and colonoscopy.   Past Medical History:  Diagnosis Date   Anxiety and depression    Cyst of left kidney    GERD (gastroesophageal reflux disease)    Gross hematuria    Internal hemorrhoids    Kidney stones    Low back pain    Lower abdominal pain    Monilial vaginitis    Nasal congestion    PONV (postoperative nausea and vomiting)    Shortness of breath dyspnea    Vaginitis     Past Surgical History:  Procedure Laterality Date   ABDOMINAL HYSTERECTOMY     still has ovaries    CYSTOSCOPY W/ RETROGRADES Bilateral 11/26/2014   Procedure: CYSTOSCOPY WITH RETROGRADE PYELOGRAM;  Surgeon: Hollice Espy, MD;  Location: [facility];  Service: Urology;  Laterality: Bilateral;   CYSTOSCOPY WITH STENT PLACEMENT Left 11/13/2014   Procedure: CYSTOSCOPY, left retrograde pyelogram WITH left ureteral STENT PLACEMENT;  Surgeon: Cleon Gustin, MD;  Location: [facility];  Service: Urology;  Laterality: Left;   CYSTOSCOPY/URETEROSCOPY/HOLMIUM LASER/STENT PLACEMENT Left 11/26/2014   Procedure: CYSTOSCOPY/URETEROSCOPY/HOLMIUM LASER/STENT PLACEMENT;  Surgeon: Hollice Espy, MD;  Location: [facility];  Service: Urology;  Laterality: Left;   NASAL SINUS SURGERY     TUBAL LIGATION      Prior to Admission medications   Medication Sig Start Date End Date Taking? Authorizing Provider  albuterol (VENTOLIN HFA) 108 (90 Base) MCG/ACT inhaler TAKE 2 PUFFS BY MOUTH EVERY 6 HOURS AS NEEDED FOR WHEEZE OR SHORTNESS OF BREATH 07/29/21  Yes Bo Merino, FNP  fluticasone (FLONASE) 50 MCG/ACT nasal spray Place 2 sprays into both nostrils daily. Patient taking differently: Place 2 sprays into both nostrils daily.  OTC 01/11/21  Yes Covington, Sarah M, PA-C  ketoconazole (NIZORAL) 2 % shampoo Apply 1 application topically See admin instructions. apply three times per week, massage into scalp and leave in for 10 minutes before rinsing out 10/09/20  Yes Moye, [place], MD  pantoprazole (PROTONIX) 40 MG tablet TAKE 1 TABLET BY MOUTH EVERY DAY 07/09/21  Yes Teodora Medici, DO  triamcinolone lotion (KENALOG) 0.1 % Apply 1 application topically 2 (two) times daily. To itchy areas of scalp and ears. Can use for up to 2 weeks at ears.Avoid applying to face, groin, and axilla. Use as directed. 10/09/20  Yes Moye, [place], MD    Allergies as of 08/11/2021 - Review Complete 08/11/2021  Allergen Reaction Noted   2,4-d dimethylamine Shortness Of Breath 11/05/2014   Sulfa antibiotics Itching and Shortness Of Breath 11/05/2014   Azithromycin Other (See Comments) 07/30/2016   Penicillin g Other (See Comments) 11/05/2014   Omnicef [cefdinir] Itching and Palpitations 11/14/2014    Family History  Problem Relation Age of Onset   Hyperlipidemia Mother    Hypertension Mother    Breast cancer Mother    Asthma Father    Prostate cancer Neg Hx    Kidney cancer Neg Hx    Bladder Cancer Neg Hx     Social History   Socioeconomic History   Marital status: Single    Spouse name: Not on file   Number of children: Not on file   Years of education: Not on file   Highest education level: Not  on file  Occupational History   Not on file  Tobacco Use   Smoking status: Every Day    Packs/day: 1.00    Years: 35.00    Total pack years: 35.00    Types: Cigarettes   Smokeless tobacco: Never  Vaping Use   Vaping Use: Never used  Substance and Sexual Activity   Alcohol use: Yes    Alcohol/week: 4.0 standard drinks of alcohol    Types: 4 Standard drinks or equivalent per week   Drug use: No   Sexual activity: Not Currently  Other Topics Concern   Not on file  Social History Narrative   Not on file   Social  Determinants of Health   Financial Resource Strain: Not on file  Food Insecurity: Not on file  Transportation Needs: Not on file  Physical Activity: Not on file  Stress: Not on file  Social Connections: Not on file  Intimate Partner Violence: Not on file    Review of Systems: See HPI, otherwise negative ROS  Physical Exam: Ht '5\' 8"'$  (1.727 m)   Wt 80.7 kg   BMI 27.06 kg/m  General:   Alert,  pleasant and cooperative in NAD Head:  Normocephalic and atraumatic. Neck:  Supple; no masses or thyromegaly. Lungs:  Clear throughout to auscultation.    Heart:  Regular rate and rhythm. Abdomen:  Soft, nontender and nondistended. Normal bowel sounds, without guarding, and without rebound.   Neurologic:  Alert and  oriented x4;  grossly normal neurologically.  Impression/Plan: Brianna Huff is here for an endoscopy and colonoscopy to be performed for GERD and screening  Risks, benefits, limitations, and alternatives regarding  endoscopy and colonoscopy have been reviewed with the patient.  Questions have been answered.  All parties agreeable.   Lucilla Lame, MD  08/21/2021, 10:07 AM

## 2021-08-21 NOTE — Anesthesia Postprocedure Evaluation (Signed)
Anesthesia Post Note  Patient: Brianna Huff  Procedure(s) Performed: COLONOSCOPY (Rectum) ESOPHAGOGASTRODUODENOSCOPY (EGD) (Mouth) POLYPECTOMY     Patient location during evaluation: PACU Anesthesia Type: General Level of consciousness: awake and alert Pain management: pain level controlled Vital Signs Assessment: post-procedure vital signs reviewed and stable Respiratory status: spontaneous breathing, nonlabored ventilation and respiratory function stable Cardiovascular status: blood pressure returned to baseline and stable Postop Assessment: no apparent nausea or vomiting Anesthetic complications: no   There were no known notable events for this encounter.  [place] Ouch

## 2021-08-21 NOTE — Op Note (Signed)
[facility] Patient Name: Brianna Huff Procedure Date: 08/21/2021 10:40 AM MRN: [id number] Account #: [id number] Date of Birth: Oct 29, 1965 Admit Type: Outpatient Age: 56 Room: [facility] OR ROOM 01 Gender: Female Note Status: Finalized Instrument Name: [id number] Procedure:             Colonoscopy Indications:           Screening for colorectal malignant neoplasm Providers:             Lucilla Lame MD, MD Medicines:             Propofol per Anesthesia Complications:         No immediate complications. Procedure:             Pre-Anesthesia Assessment:                        - Prior to the procedure, a History and Physical was                         performed, and patient medications and allergies were                         reviewed. The patient's tolerance of previous                         anesthesia was also reviewed. The risks and benefits                         of the procedure and the sedation options and risks                         were discussed with the patient. All questions were                         answered, and informed consent was obtained. Prior                         Anticoagulants: The patient has taken no previous                         anticoagulant or antiplatelet agents. ASA Grade                         Assessment: II - A patient with mild systemic disease.                         After reviewing the risks and benefits, the patient                         was deemed in satisfactory condition to undergo the                         procedure.                        After obtaining informed consent, the colonoscope was                         passed under direct vision. Throughout the procedure,  the patient's blood pressure, pulse, and oxygen                         saturations were monitored continuously. The                         Colonoscope was introduced through the anus and                          advanced to the the cecum, identified by appendiceal                         orifice and ileocecal valve. The colonoscopy was                         performed without difficulty. The patient tolerated                         the procedure well. The quality of the bowel                         preparation was excellent. Findings:      The perianal and digital rectal examinations were normal.      A 5 mm polyp was found in the sigmoid colon. The polyp was sessile. The       polyp was removed with a cold snare. Resection and retrieval were       complete.      A few small-mouthed diverticula were found in the entire colon.      Non-bleeding internal hemorrhoids were found during retroflexion. The       hemorrhoids were Grade I (internal hemorrhoids that do not prolapse). Impression:            - One 5 mm polyp in the sigmoid colon, removed with a                         cold snare. Resected and retrieved.                        - Diverticulosis in the entire examined colon.                        - Non-bleeding internal hemorrhoids. Recommendation:        - Discharge patient to home.                        - Resume previous diet.                        - Continue present medications.                        - Await pathology results.                        - If the pathology report reveals adenomatous tissue,                         then repeat the colonoscopy for surveillance in 7  years. Procedure Code(s):     --- Professional ---                        [phone], Colonoscopy, flexible; with removal of                         tumor(s), polyp(s), or other lesion(s) by snare                         technique Diagnosis Code(s):     --- Professional ---                        Z12.11, Encounter for screening for malignant neoplasm                         of colon                        K63.5, Polyp of colon CPT copyright 2019 [employer]. All rights  reserved. The codes documented in this report are preliminary and upon coder review may  be revised to meet current compliance requirements. Lucilla Lame MD, MD 08/21/2021 11:15:45 AM This report has been signed electronically. Number of Addenda: 0 Note Initiated On: 08/21/2021 10:40 AM Scope Withdrawal Time: 0 hours 10 minutes 57 seconds  Total Procedure Duration: 0 hours 17 minutes 13 seconds  Estimated Blood Loss:  Estimated blood loss: none.      [facility]

## 2021-08-24 ENCOUNTER — Encounter: Payer: Self-pay | Admitting: Gastroenterology

## 2021-08-25 ENCOUNTER — Encounter: Payer: Self-pay | Admitting: Gastroenterology

## 2021-08-25 LAB — SURGICAL PATHOLOGY

## 2021-08-27 ENCOUNTER — Ambulatory Visit: Payer: Managed Care, Other (non HMO) | Admitting: Gastroenterology

## 2021-10-15 ENCOUNTER — Encounter: Payer: Managed Care, Other (non HMO) | Admitting: Dermatology

## 2021-10-17 ENCOUNTER — Other Ambulatory Visit: Payer: Self-pay | Admitting: Internal Medicine

## 2021-10-17 DIAGNOSIS — K219 Gastro-esophageal reflux disease without esophagitis: Secondary | ICD-10-CM

## 2021-10-19 NOTE — Telephone Encounter (Signed)
Requested Prescriptions  Pending Prescriptions Disp Refills  . pantoprazole (PROTONIX) 40 MG tablet [Pharmacy Med Name: PANTOPRAZOLE SOD DR 40 MG TAB] 90 tablet 0    Sig: TAKE 1 TABLET BY MOUTH EVERY DAY     Gastroenterology: Proton Pump Inhibitors Passed - 10/17/2021  9:44 AM      Passed - Valid encounter within last 12 months    Recent Outpatient Visits          3 months ago Upper respiratory tract infection, unspecified type   [place], [place]-C   6 months ago Gastroesophageal reflux disease, unspecified whether esophagitis present   [place], [place]

## 2021-10-25 ENCOUNTER — Ambulatory Visit
Admission: RE | Admit: 2021-10-25 | Discharge: 2021-10-25 | Disposition: A | Discharge status: Home / Self Care | Payer: Managed Care, Other (non HMO) | Source: Ambulatory Visit | Attending: Emergency Medicine | Admitting: Emergency Medicine

## 2021-10-25 VITALS — BP 122/62 | HR 92 | Temp 98.2°F | Resp 18 | Ht 68.0 in | Wt 170.0 lb

## 2021-10-25 DIAGNOSIS — J0100 Acute maxillary sinusitis, unspecified: Secondary | ICD-10-CM | POA: Diagnosis not present

## 2021-10-25 MED ORDER — DOXYCYCLINE HYCLATE 100 MG PO CAPS: 100.0000 mg | ORAL_CAPSULE | Freq: Two times a day (BID) | ORAL | 0 refills | Status: AC

## 2021-10-25 NOTE — ED Triage Notes (Signed)
Patient to Urgent Care with complaints of cough and sinus pressure. Symptoms started two weeks ago and have been worsening. Reports pain is worst when laying down. States cough is intermittently productive with yellow mucus. Denies any known fevers.

## 2021-10-25 NOTE — Discharge Instructions (Addendum)
Take the doxycycline as directed.    Follow up with your primary care provider if your symptoms are not improving.    

## 2021-10-25 NOTE — ED Provider Notes (Signed)
Roderic Palau    CSN: [id number] Arrival date & time: 10/25/21  0847      History   Chief Complaint Chief Complaint  Patient presents with   Cough    Sinus infection - Entered by patient    HPI Brianna Huff is a 56 y.o. female.  Patient presents with 2-week history of sinus congestion, sinus pressure, postnasal drip, cough.  Her symptoms are getting worse.  Treatment at home with Flonase.  No fever, chills, sore throat, shortness of breath, vomiting, diarrhea, or other symptoms.  Current everyday smoker.  The history is provided by the patient and medical records.    Past Medical History:  Diagnosis Date   Anxiety and depression    Cyst of left kidney    GERD (gastroesophageal reflux disease)    Gross hematuria    Internal hemorrhoids    Kidney stones    Low back pain    Lower abdominal pain    Monilial vaginitis    Nasal congestion    PONV (postoperative nausea and vomiting)    Shortness of breath dyspnea    Vaginitis     Patient Active Problem List   Diagnosis Date Noted   Screening for colon cancer    Polyp of sigmoid colon    Gastroesophageal reflux disease 04/17/2021   History of kidney stones 04/17/2021   Hot flashes 04/17/2021   Seasonal allergies 04/17/2021   Left ureteral stone 11/16/2014   Gross hematuria 11/05/2014   Blood in the urine 10/27/2014   Frank hematuria 10/25/2014   Acute vaginitis 10/01/2014   Acute back pain with sciatica 09/25/2014   Hemorrhoids, internal 09/25/2014   Anxiety and depression 09/25/2014   Candida vaginitis 09/25/2014   Cigarette smoker 02/20/2014    Past Surgical History:  Procedure Laterality Date   ABDOMINAL HYSTERECTOMY     still has ovaries    COLONOSCOPY N/A 08/21/2021   Procedure: COLONOSCOPY;  Surgeon: Lucilla Lame, MD;  Location: [place];  Service: Endoscopy;  Laterality: N/A;   CYSTOSCOPY W/ RETROGRADES Bilateral 11/26/2014   Procedure: CYSTOSCOPY WITH RETROGRADE PYELOGRAM;   Surgeon: Hollice Espy, MD;  Location: [facility];  Service: Urology;  Laterality: Bilateral;   CYSTOSCOPY WITH STENT PLACEMENT Left 11/13/2014   Procedure: CYSTOSCOPY, left retrograde pyelogram WITH left ureteral STENT PLACEMENT;  Surgeon: Cleon Gustin, MD;  Location: [facility];  Service: Urology;  Laterality: Left;   CYSTOSCOPY/URETEROSCOPY/HOLMIUM LASER/STENT PLACEMENT Left 11/26/2014   Procedure: CYSTOSCOPY/URETEROSCOPY/HOLMIUM LASER/STENT PLACEMENT;  Surgeon: Hollice Espy, MD;  Location: [facility];  Service: Urology;  Laterality: Left;   ESOPHAGOGASTRODUODENOSCOPY N/A 08/21/2021   Procedure: ESOPHAGOGASTRODUODENOSCOPY (EGD);  Surgeon: Lucilla Lame, MD;  Location: [place];  Service: Endoscopy;  Laterality: N/A;   NASAL SINUS SURGERY     POLYPECTOMY  08/21/2021   Procedure: POLYPECTOMY;  Surgeon: Lucilla Lame, MD;  Location: [place];  Service: Endoscopy;;   TUBAL LIGATION      OB History   No obstetric history on file.      Home Medications    Prior to Admission medications   Medication Sig Start Date End Date Taking? Authorizing Provider  doxycycline (VIBRAMYCIN) 100 MG capsule Take 1 capsule (100 mg total) by mouth 2 (two) times daily for 10 days. 10/25/21 11/04/21 Yes Sharion Balloon, NP  albuterol (VENTOLIN HFA) 108 (90 Base) MCG/ACT inhaler TAKE 2 PUFFS BY MOUTH EVERY 6 HOURS AS NEEDED FOR WHEEZE OR SHORTNESS OF BREATH 07/29/21   Bo Merino, FNP  fluticasone (FLONASE) 50 MCG/ACT nasal spray Place 2 sprays into both nostrils daily. Patient taking differently: Place 2 sprays into both nostrils daily. OTC 01/11/21   Hughie Closs, PA-C  ketoconazole (NIZORAL) 2 % shampoo Apply 1 application topically See admin instructions. apply three times per week, massage into scalp and leave in for 10 minutes before rinsing out 10/09/20   Moye, [place], MD  pantoprazole (PROTONIX) 40 MG tablet TAKE 1 TABLET BY MOUTH EVERY DAY 10/19/21   Teodora Medici, DO   triamcinolone lotion (KENALOG) 0.1 % Apply 1 application topically 2 (two) times daily. To itchy areas of scalp and ears. Can use for up to 2 weeks at ears.Avoid applying to face, groin, and axilla. Use as directed. 10/09/20   Alfonso Patten, MD    Family History Family History  Problem Relation Age of Onset   Hyperlipidemia Mother    Hypertension Mother    Breast cancer Mother    Asthma Father    Prostate cancer Neg Hx    Kidney cancer Neg Hx    Bladder Cancer Neg Hx     Social History Social History   Tobacco Use   Smoking status: Every Day    Packs/day: 1.00    Years: 35.00    Total pack years: 35.00    Types: Cigarettes   Smokeless tobacco: Never  Vaping Use   Vaping Use: Never used  Substance Use Topics   Alcohol use: Yes    Alcohol/week: 4.0 standard drinks of alcohol    Types: 4 Standard drinks or equivalent per week   Drug use: No     Allergies   2,4-d dimethylamine; Sulfa antibiotics; Azithromycin; Penicillin g; and Omnicef [cefdinir]   Review of Systems Review of Systems  Constitutional:  Negative for chills and fever.  HENT:  Positive for congestion, postnasal drip, rhinorrhea, sinus pressure and sinus pain. Negative for ear pain and sore throat.   Respiratory:  Positive for cough. Negative for shortness of breath.   Cardiovascular:  Negative for chest pain and palpitations.  Gastrointestinal:  Negative for diarrhea and vomiting.  Skin:  Negative for color change and rash.  All other systems reviewed and are negative.    Physical Exam Triage Vital Signs ED Triage Vitals  Enc Vitals Group     BP 10/25/21 0901 122/62     Pulse Rate 10/25/21 0901 92     Resp 10/25/21 0901 18     Temp 10/25/21 0901 98.2 F (36.8 C)     Temp src --      SpO2 10/25/21 0901 95 %     Weight 10/25/21 0900 170 lb (77.1 kg)     Height 10/25/21 0900 '5\' 8"'$  (1.727 m)     Head Circumference --      Peak Flow --      Pain Score 10/25/21 0859 5     Pain Loc --      Pain  Edu? --      Excl. in [place]? --    No data found.  Updated Vital Signs BP 122/62   Pulse 92   Temp 98.2 F (36.8 C)   Resp 18   Ht '5\' 8"'$  (1.727 m)   Wt 170 lb (77.1 kg)   SpO2 95%   BMI 25.85 kg/m   Visual Acuity Right Eye Distance:   Left Eye Distance:   Bilateral Distance:    Right Eye Near:   Left Eye Near:    Bilateral Near:  Physical Exam Vitals and nursing note reviewed.  Constitutional:      General: She is not in acute distress.    Appearance: She is well-developed. She is not ill-appearing.  HENT:     Right Ear: Tympanic membrane normal.     Left Ear: Tympanic membrane normal.     Nose: Congestion present.     Mouth/Throat:     Mouth: Mucous membranes are moist.     Pharynx: Oropharynx is clear.  Cardiovascular:     Rate and Rhythm: Normal rate and regular rhythm.     Heart sounds: Normal heart sounds.  Pulmonary:     Effort: Pulmonary effort is normal. No respiratory distress.     Breath sounds: Normal breath sounds.  Musculoskeletal:     Cervical back: Neck supple.  Skin:    General: Skin is warm and dry.  Neurological:     Mental Status: She is alert.  Psychiatric:        Mood and Affect: Mood normal.        Behavior: Behavior normal.      UC Treatments / Results  Labs (all labs ordered are listed, but only abnormal results are displayed) Labs Reviewed - No data to display  EKG   Radiology No results found.  Procedures Procedures (including critical care time)  Medications Ordered in UC Medications - No data to display  Initial Impression / Assessment and Plan / UC Course  I have reviewed the triage vital signs and the nursing notes.  Pertinent labs & imaging results that were available during my care of the patient were reviewed by me and considered in my medical decision making (see chart for details).   Acute sinusitis.  Patient is allergic to several antibiotics.  Treating today with doxycycline.  Discussed continued use  of Flonase nasal spray.  Tylenol or ibuprofen as needed.  Plain Mucinex as needed.  Instructed patient to follow up with her PCP if her symptoms are not improving.  She agrees to plan of care.     Final Clinical Impressions(s) / UC Diagnoses   Final diagnoses:  Acute non-recurrent maxillary sinusitis     Discharge Instructions      Take the doxycycline as directed.  Follow up with your primary care provider if your symptoms are not improving.        ED Prescriptions     Medication Sig Dispense Auth. Provider   doxycycline (VIBRAMYCIN) 100 MG capsule Take 1 capsule (100 mg total) by mouth 2 (two) times daily for 10 days. 20 capsule Sharion Balloon, NP      PDMP not reviewed this encounter.   Sharion Balloon, NP 10/25/21 [phone]

## 2021-11-09 ENCOUNTER — Encounter: Payer: Self-pay | Admitting: Internal Medicine

## 2021-11-09 ENCOUNTER — Ambulatory Visit: Payer: Managed Care, Other (non HMO) | Admitting: Internal Medicine

## 2021-11-09 VITALS — BP 122/74 | HR 93 | Temp 98.4°F | Resp 18 | Ht 68.0 in | Wt 175.4 lb

## 2021-11-09 DIAGNOSIS — J309 Allergic rhinitis, unspecified: Secondary | ICD-10-CM

## 2021-11-09 MED ORDER — CETIRIZINE HCL 10 MG PO TABS: 10.0000 mg | ORAL_TABLET | Freq: Every day | ORAL | 11 refills | Status: DC

## 2021-11-09 MED ORDER — AZELASTINE HCL 0.1 % NA SOLN: 1.0000 | Freq: Two times a day (BID) | NASAL | 2 refills | Status: DC

## 2021-11-09 MED ORDER — METHYLPREDNISOLONE 4 MG PO TBPK: ORAL_TABLET | ORAL | 0 refills | Status: DC

## 2021-11-09 NOTE — Patient Instructions (Addendum)
It was great seeing you today!  Plan discussed at today's visit: -Start Zytrec daily - take at night Generic name is Cetirizine. Next best is Xyzal, then Claritin and Allegra  -Continue Flonase 2 sprays on each side twice a day  -Astelin nasal spray for allergies also sent to pharmacy  -Steroid pack for symptomatic relief -Referral to ENT ordered today  Follow up in: as needed  Take care and let us know if you have any questions or concerns prior to your next visit.  Dr. Rosana Berger

## 2021-11-09 NOTE — Progress Notes (Signed)
Acute Office Visit  Subjective:     Patient ID: Brianna Huff, female    DOB: 1965-04-21, 56 y.o.   MRN: [id number]  Chief Complaint  Patient presents with   Sinusitis    Onset 4 weeks, went to urgent care on 10/22 dx w/ sinus infection given doxy w/ no improvement.  Symptoms include facial pressure/pain w/ headache and cough    HPI Patient is in today for ear pain and sinus pain.  URI Compliant:  -Fever: no -Cough: yes, dry and sometimes productive with yellow sputum. Productive earliy morning or lat at night  -Shortness of breath: no -Wheezing: no -Chest tightness: no -Chest congestion: yes -Nasal congestion: yes -Runny nose: yes, clear -Post nasal drip: yes -Sneezing: yes -Sore throat: yes -Swollen glands: no -Sinus pressure: yes forehead and in between eyes  -Headache: yes -Toothache: no -Ear pain: no  -Ear pressure: yes bilateral but worse in the left  -Eyes red/itching:yes -Eye drainage/crusting: yes  -Vomiting: no -Sick contacts: no -Context: fluctuating -Recurrent sinusitis: no -Relief with OTC cold/cough medications: no  -Treatments attempted: does not use Albuterol, Flonase twice a day. Treated with Doxycyline end of October without much change.  Review of Systems  Constitutional:  Negative for chills and fever.  HENT:  Positive for congestion, sinus pain and sore throat. Negative for ear pain.   Respiratory:  Positive for cough. Negative for shortness of breath and wheezing.   Cardiovascular:  Negative for chest pain.        Objective:    BP 122/74   Pulse 93   Temp 98.4 F (36.9 C)   Resp 18   Ht '5\' 8"'$  (1.727 m)   Wt 175 lb 6.4 oz (79.6 kg)   SpO2 97%   BMI 26.67 kg/m  BP Readings from Last 3 Encounters:  11/09/21 122/74  10/25/21 122/62  08/21/21 119/63   Wt Readings from Last 3 Encounters:  11/09/21 175 lb 6.4 oz (79.6 kg)  10/25/21 170 lb (77.1 kg)  08/21/21 171 lb (77.6 kg)      Physical Exam Constitutional:       Appearance: Normal appearance.  HENT:     Head: Normocephalic and atraumatic.     Right Ear: Tympanic membrane, ear canal and external ear normal.     Left Ear: Ear canal and external ear normal.     Ears:     Comments: What appears to be's guarding of the inferior aspect of left TM    Nose: Congestion present.     Comments: Right inferior turbinate inflammation    Mouth/Throat:     Mouth: Mucous membranes are moist.     Comments: Postnasal drip present. Eyes:     Conjunctiva/sclera: Conjunctivae normal.  Cardiovascular:     Rate and Rhythm: Normal rate and regular rhythm.  Pulmonary:     Effort: Pulmonary effort is normal.     Breath sounds: Normal breath sounds. No wheezing, rhonchi or rales.  Lymphadenopathy:     Cervical: No cervical adenopathy.  Skin:    General: Skin is warm and dry.  Neurological:     General: No focal deficit present.     Mental Status: She is alert. Mental status is at baseline.  Psychiatric:        Mood and Affect: Mood normal.        Behavior: Behavior normal.     No results found for any visits on 11/09/21.      Assessment & Plan:  1. Chronic allergic rhinitis: Symptoms have been going on for months now without resolution.  She was on antibiotic therapy that did not help her symptoms.  We will treat with Medrol Dosepak to help with sore throat and congestion as well as sinus pain.  I do feel like most of her symptoms are allergic in nature, start Zyrtec 10 mg daily, continue Flonase and add as Astelin nasal spray.  Referral placed to ear nose and throat for complete exam.  - azelastine (ASTELIN) 0.1 % nasal spray; Place 1 spray into both nostrils 2 (two) times daily. Use in each nostril as directed  Dispense: 30 mL; Refill: 2 - cetirizine (ZYRTEC) 10 MG tablet; Take 1 tablet (10 mg total) by mouth daily.  Dispense: 30 tablet; Refill: 11 - Ambulatory referral to ENT - methylPREDNISolone (MEDROL DOSEPAK) 4 MG TBPK tablet; Day 1: Take 8 mg (2  tablets) before breakfast, 4 mg (1 tablet) after lunch, 4 mg (1 tablet) after supper, and 8 mg (2 tablets) at bedtime. Day 2:Take 4 mg (1 tablet) before breakfast, 4 mg (1 tablet) after lunch, 4 mg (1 tablet) after supper, and 8 mg (2 tablets) at bedtime. Day 3: Take 4 mg (1 tablet) before breakfast, 4 mg (1 tablet) after lunch, 4 mg (1 tablet) after supper, and 4 mg (1 tablet) at bedtime. Day 4: Take 4 mg (1 tablet) before breakfast, 4 mg (1 tablet) after lunch, and 4 mg (1 tablet) at bedtime. Day 5: Take 4 mg (1 tablet) before breakfast and 4 mg (1 tablet) at bedtime. Day 6: Take 4 mg (1 tablet) before breakfast.  Dispense: 1 each; Refill: 0   Return if symptoms worsen or fail to improve.  Teodora Medici, DO

## 2021-11-12 ENCOUNTER — Encounter: Payer: Self-pay | Admitting: Dermatology

## 2021-11-12 ENCOUNTER — Ambulatory Visit: Payer: Managed Care, Other (non HMO) | Admitting: Dermatology

## 2021-11-12 DIAGNOSIS — L700 Acne vulgaris: Secondary | ICD-10-CM

## 2021-11-12 DIAGNOSIS — L720 Epidermal cyst: Secondary | ICD-10-CM | POA: Diagnosis not present

## 2021-11-12 DIAGNOSIS — L732 Hidradenitis suppurativa: Secondary | ICD-10-CM

## 2021-11-12 DIAGNOSIS — D692 Other nonthrombocytopenic purpura: Secondary | ICD-10-CM

## 2021-11-12 DIAGNOSIS — D229 Melanocytic nevi, unspecified: Secondary | ICD-10-CM

## 2021-11-12 DIAGNOSIS — L578 Other skin changes due to chronic exposure to nonionizing radiation: Secondary | ICD-10-CM | POA: Diagnosis not present

## 2021-11-12 DIAGNOSIS — L219 Seborrheic dermatitis, unspecified: Secondary | ICD-10-CM | POA: Diagnosis not present

## 2021-11-12 DIAGNOSIS — Z1283 Encounter for screening for malignant neoplasm of skin: Secondary | ICD-10-CM | POA: Diagnosis not present

## 2021-11-12 DIAGNOSIS — D492 Neoplasm of unspecified behavior of bone, soft tissue, and skin: Secondary | ICD-10-CM

## 2021-11-12 DIAGNOSIS — D239 Other benign neoplasm of skin, unspecified: Secondary | ICD-10-CM

## 2021-11-12 DIAGNOSIS — L821 Other seborrheic keratosis: Secondary | ICD-10-CM

## 2021-11-12 DIAGNOSIS — L814 Other melanin hyperpigmentation: Secondary | ICD-10-CM

## 2021-11-12 MED ORDER — DOXYCYCLINE HYCLATE 20 MG PO TABS: 20.0000 mg | ORAL_TABLET | Freq: Two times a day (BID) | ORAL | 2 refills | Status: AC

## 2021-11-12 MED ORDER — MUPIROCIN 2 % EX OINT: 1.0000 | TOPICAL_OINTMENT | Freq: Every day | CUTANEOUS | 0 refills | Status: DC

## 2021-11-12 MED ORDER — CLINDAMYCIN PHOSPHATE 1 % EX SOLN: Freq: Two times a day (BID) | CUTANEOUS | 2 refills | Status: DC

## 2021-11-12 NOTE — Patient Instructions (Addendum)
For Hidradenitis Suppurativa  Start doxycycline 20 mg twice daily with food as needed for flares.  Start clindamycin solution 2 times daily as needed. Recommend HibaClens or benzoyl peroxide wash in the shower.   Doxycycline should be taken with food to prevent nausea. Do not lay down for 30 minutes after taking. Be cautious with sun exposure and use good sun protection while on this medication. Pregnant women should not take this medication.   For Scalp Continue triamcinolone 0.1% lotion 1-2 times daily as needed for itch at scalp. Avoid applying to face, groin, and axilla. Use as directed. Long-term use can cause thinning of the skin.  If not improving can use clindamycin solution 1-2 times daily as needed for bumps.   Topical steroids (such as triamcinolone, fluocinolone, fluocinonide, mometasone, clobetasol, halobetasol, betamethasone, hydrocortisone) can cause thinning and lightening of the skin if they are used for too long in the same area. Your physician has selected the right strength medicine for your problem and area affected on the body. Please use your medication only as directed by your physician to prevent side effects.   After Your Sutures Fall Out If desired, you can start silicone scar cream twice a day to fade/minimize the scar. Options include Serica moisturizing scar formula cream available locally or Strataderm at www.us.stratpharma-shop.com, discount code C43EG for 40% off. These can be used for the first year after a scar appears to help with scar remodeling if desired. Scars remodel on their own for a full year and will gradually improve in appearance over time.   Wound Care Instructions for After Surgery  On the day following your surgery, you should begin doing daily dressing changes until your sutures are removed: Remove the bandage. Cleanse the wound gently with soap and water.  Make sure you then dry the skin surrounding the wound completely or the tape will not  stick to the skin. Do not use cotton balls on the wound. After the wound is clean and dry, apply the ointment (either prescription antibiotic prescribed by your doctor or plain Vaseline if nothing was prescribed) gently with a Q-tip. If you are using a bandaid to cover: Apply a bandaid large enough to cover the entire wound. If you do not have a bandaid large enough to cover the wound OR if you are sensitive to bandaid adhesive: Cut a non-stick pad (such as Telfa) to fit the size of the wound.  Cover the wound with the non-stick pad. If the wound is draining, you may want to add a small amount of gauze on top of the non-stick pad for a little added compression to the area. Use tape to seal the area completely.  For the next 1-2 weeks: Be sure to keep the wound moist with ointment 24/7 to ensure best healing. If you are unable to cover the wound with a bandage to hold the ointment in place, you may need to reapply the ointment several times a day. Do not bend over or lift heavy items to reduce the chance of elevated blood pressure to the wound. Do not participate in particularly strenuous activities.  Below is a list of dressing supplies you might need.  Cotton-tipped applicators - Q-tips Gauze pads (2x2 and/or 4x4) - All-Purpose Sponges New and clean tube of petroleum jelly (Vaseline) OR prescription antibiotic ointment if prescribed Either a bandaid large enough to cover the entire wound OR non-stick dressing material (Telfa) and Tape (Paper or Hypafix)  FOR ADULT SURGERY PATIENTS: If you need something  for pain relief, you may take 1 extra strength Tylenol (acetaminophen) and 2 ibuprofen (200 mg) together every 4 hours as needed. (Do not take these medications if you are allergic to them or if you know you cannot take them for any other reason). Typically you may only need pain medication for 1-3 days.   Comments on the Post-Operative Period Slight swelling and redness often appear around  the wound. This is normal and will disappear within several days following the surgery. The healing wound will drain a brownish-red-yellow discharge during healing. This is a normal phase of wound healing. As the wound begins to heal, the drainage may increase in amount. Again, this drainage is normal. Notify us if the drainage becomes persistently bloody, excessively swollen, or intensely painful or develops a foul odor or red streaks.  The healing wound will also typically be itchy. This is normal. If you have severe or persistent pain, Notify us if the discomfort is severe or persistent. Avoid alcoholic beverages when taking pain medicine.  In Case of Wound Hemorrhage A wound hemorrhage is when the bandage suddenly becomes soaked with bright red blood and flows profusely. If this happens, sit down or lie down with your head elevated. If the wound has a dressing on it, do not remove the dressing. Apply pressure to the existing gauze. If the wound is not covered, use a gauze pad to apply pressure and continue applying the pressure for 20 minutes without peeking. DO NOT COVER THE WOUND WITH A LARGE TOWEL OR [place] CLOTH. Release your hand from the wound site but do not remove the dressing. If the bleeding has stopped, gently clean around the wound. Leave the dressing in place for 24 hours if possible. This wait time allows the blood vessels to close off so that you do not spark a new round of bleeding by disrupting the newly clotted blood vessels with an immediate dressing change. If the bleeding does not subside, continue to hold pressure for 40 minutes. If bleeding continues, page your physician, contact an After Hours clinic or go to the Emergency Room.  Due to recent changes in healthcare laws, you may see results of your pathology and/or laboratory studies on MyChart before the doctors have had a chance to review them. We understand that in some cases there may be results that are confusing or concerning  to you. Please understand that not all results are received at the same time and often the doctors may need to interpret multiple results in order to provide you with the best plan of care or course of treatment. Therefore, we ask that you please give [place] 2 business days to thoroughly review all your results before contacting the office for clarification. Should we see a critical lab result, you will be contacted sooner.   If You Need Anything After Your Visit  If you have any questions or concerns for your doctor, please call our main line at [phone] and press option 4 to reach your doctor's medical assistant. If no one answers, please leave a voicemail as directed and we will return your call as soon as possible. Messages left after 4 pm will be answered the following business day.   You may also send [place] a message via [place]. We typically respond to MyChart messages within 1-2 business days.  For prescription refills, please ask your pharmacy to contact our office. Our fax number is [phone].  If you have an urgent issue when the clinic is closed that  cannot wait until the next business day, you can page your doctor at the number below.    Please note that while we do our best to be available for urgent issues outside of office hours, we are not available 24/7.   If you have an urgent issue and are unable to reach [place], you may choose to seek medical care at your doctor's office, retail clinic, urgent care center, or emergency room.  If you have a medical emergency, please immediately call 911 or go to the emergency department.  Pager Numbers  - Dr. Nehemiah Massed: [phone]  - Dr. Laurence Ferrari: [phone]  - Dr. Nicole Kindred: [phone]  In the event of inclement weather, please call our main line at [phone] for an update on the status of any delays or closures.  Dermatology Medication Tips: Please keep the boxes that topical medications come in in order to help keep track of the  instructions about where and how to use these. Pharmacies typically print the medication instructions only on the boxes and not directly on the medication tubes.   If your medication is too expensive, please contact our office at [phone] option 4 or send [place] a message through [place].   We are unable to tell what your co-pay for medications will be in advance as this is different depending on your insurance coverage. However, we may be able to find a substitute medication at lower cost or fill out paperwork to get insurance to cover a needed medication.   If a prior authorization is required to get your medication covered by your insurance company, please allow [place] 1-2 business days to complete this process.  Drug prices often vary depending on where the prescription is filled and some pharmacies may offer cheaper prices.  The website www.goodrx.com contains coupons for medications through different pharmacies. The prices here do not account for what the cost may be with help from insurance (it may be cheaper with your insurance), but the website can give you the price if you did not use any insurance.  - You can print the associated coupon and take it with your prescription to the pharmacy.  - You may also stop by our office during regular business hours and pick up a GoodRx coupon card.  - If you need your prescription sent electronically to a different pharmacy, notify our office through [facility] or by phone at [phone] option 4.     Si Usted Necesita Algo Despus de Su Visita  Tambin puede enviarnos un mensaje a travs de Pharmacist, community. Por lo general respondemos a los mensajes de MyChart en el transcurso de 1 a 2 das hbiles.  Para renovar recetas, por favor pida a su farmacia que se ponga en contacto con nuestra oficina. Harland Dingwall de fax es [place] [phone].  Si tiene un asunto urgente cuando la clnica est cerrada y que no puede esperar hasta el siguiente da hbil,  puede llamar/localizar a su doctor(a) al nmero que aparece a continuacin.   Por favor, tenga en cuenta que aunque hacemos todo lo posible para estar disponibles para asuntos urgentes fuera del horario de [place], no estamos disponibles las 24 horas del da, los 7 das de la [place].   Si tiene un problema urgente y no puede comunicarse con nosotros, puede optar por buscar atencin mdica  en el consultorio de su doctor(a), en una clnica privada, en un centro de atencin urgente o en una sala de emergencias.  Si tiene [employer], por favor  llame inmediatamente al 911 o vaya a la sala de emergencias.  Nmeros de bper  - Dr. Nehemiah Massed: [phone]  - Dra. Moye: [phone]  - Dra. Nicole Kindred: [phone]  En caso de inclemencias del [place], por favor llame a Johnsie Kindred principal al [phone] para una actualizacin sobre el [place] de cualquier retraso o cierre.  Consejos para la medicacin en dermatologa: Por favor, guarde las cajas en las que vienen los medicamentos de uso tpico para ayudarle a seguir las instrucciones sobre dnde y cmo usarlos. Las farmacias generalmente imprimen las instrucciones del medicamento slo en las cajas y no directamente en los tubos del [place].   Si su medicamento es muy caro, por favor, pngase en contacto con Zigmund Daniel llamando al [phone] y presione la opcin 4 o envenos un mensaje a travs de Pharmacist, community.   No podemos decirle cul ser su copago por los medicamentos por adelantado ya que esto es diferente dependiendo de la cobertura de su seguro. Sin embargo, es posible que podamos encontrar un medicamento sustituto a Electrical engineer un formulario para que el seguro cubra el medicamento que se considera necesario.   Si se requiere una autorizacin previa para que su compaa de seguros [place] su medicamento, por favor permtanos de 1 a 2 das hbiles para completar este proceso.  Los precios de los medicamentos varan con  frecuencia dependiendo del Environmental consultant de dnde se surte la receta y alguna farmacias pueden ofrecer precios ms baratos.  El sitio web www.goodrx.com tiene cupones para medicamentos de Airline pilot. Los precios aqu no tienen en cuenta lo que podra costar con la ayuda del seguro (puede ser ms barato con su seguro), pero el sitio web puede darle el precio si no utiliz Research scientist (physical sciences).  - Puede imprimir el cupn correspondiente y llevarlo con su receta a la farmacia.  - Tambin puede pasar por nuestra oficina durante el horario de atencin regular y Charity fundraiser una tarjeta de cupones de GoodRx.  - Si necesita que su receta se enve electrnicamente a una farmacia diferente, informe a nuestra oficina a travs de MyChart de [place] o por telfono llamando al [phone] y presione la opcin 4.

## 2021-11-12 NOTE — Progress Notes (Signed)
Follow-Up Visit   Subjective  Brianna Huff is a 56 y.o. female who presents for the following: FBSE (Hx AK. Fhx skin cancer in patient's father and aunt. Patient with a spot at left arm, left axilla, right cheek.) and Procedure (Patient here for removal of cyst at neck.).  The patient presents for Total-Body Skin Exam (TBSE) for skin cancer screening and mole check.  The patient has spots, moles and lesions to be evaluated, some may be new or changing and the patient has concerns that these could be cancer.  Patient advises she does have boils/cysts that come up in the groin area that have had to be lanced and drained.   The following portions of the chart were reviewed this encounter and updated as appropriate:   Tobacco  Allergies  Meds  Problems  Med Hx  Surg Hx  Fam Hx      Review of Systems:  No other skin or systemic complaints except as noted in HPI or Assessment and Plan.  Objective  Well appearing patient in no apparent distress; mood and affect are within normal limits.  A full examination was performed including scalp, head, eyes, ears, nose, lips, neck, chest, axillae, abdomen, back, buttocks, bilateral upper extremities, bilateral lower extremities, hands, feet, fingers, toes, fingernails, and toenails. All findings within normal limits unless otherwise noted below.  mid anterior neck 0.8 cm superficial subcutaneous papule  Left Forearm 0.3 cm skin colored papule with adjacent scar  Left Medial Thigh Soft skin colored papule  Pubic Cysts and scarring with double open comedones  Scalp Pink patches with greasy scale.     Assessment & Plan  Neoplasm of skin (3) mid anterior neck  Skin excision  Lesion length (cm):  1 Total excision diameter (cm):  1 Informed consent: discussed and consent obtained   Timeout: patient name, date of birth, surgical site, and procedure verified   Procedure prep:  Patient was prepped and draped in usual sterile  fashion Prep type:  Chlorhexidine Anesthesia: the lesion was anesthetized in a standard fashion   Anesthetic:  1% lidocaine w/ epinephrine 1-100,000 buffered w/ 8.4% NaHCO3 (6cc) Hemostasis achieved with: pressure and electrodesiccation    Skin repair Complexity:  Intermediate Final length (cm):  3.1 Informed consent: discussed and consent obtained   Timeout: patient name, date of birth, surgical site, and procedure verified   Procedure prep:  Patient was prepped and draped in usual sterile fashion Prep type:  Chlorhexidine Anesthesia: the lesion was anesthetized in a standard fashion   Anesthetic:  1% lidocaine w/ epinephrine 1-100,000 local infiltration Reason for type of repair: reduce tension to allow closure, reduce the risk of dehiscence, infection, and necrosis, preserve normal anatomy and preserve normal anatomical and functional relationships   Undermining: edges undermined   Subcutaneous layers (deep stitches):  Suture size:  4-0 Suture type: Vicryl (polyglactin 910)   Fine/surface layer approximation (top stitches):  Suture size:  4-0 Suture type: Vicryl Rapide (coated polyglactin 910)   Hemostasis achieved with: suture, pressure and electrodesiccation Outcome: patient tolerated procedure well with no complications   Post-procedure details: wound care instructions given   Additional details:  Mupirocin and a pressure dressing applied  mupirocin ointment (BACTROBAN) 2 % Apply 1 Application topically daily.  Specimen 2 - Surgical pathology Differential Diagnosis: r/o Cyst vs Other  Check Margins: No 0.8 cm superficial subcutaneous papule  Left Forearm  Skin / nail biopsy Type of biopsy: tangential   Informed consent: discussed and consent obtained  Timeout: patient name, date of birth, surgical site, and procedure verified   Procedure prep:  Patient was prepped and draped in usual sterile fashion Prep type:  Isopropyl alcohol Anesthesia: the lesion was  anesthetized in a standard fashion   Anesthetic:  1% lidocaine w/ epinephrine 1-100,000 buffered w/ 8.4% NaHCO3 Instrument used: flexible razor blade   Hemostasis achieved with: aluminum chloride   Outcome: patient tolerated procedure well   Post-procedure details: wound care instructions given   Additional details:  Petrolatum and a pressure bandage applied  Specimen 1 - Surgical pathology Differential Diagnosis: r/o BCC  Check Margins: No 0.3 cm skin colored papule with adjacent scar  Left Medial Thigh  Favor Nevus lipomatosus superficialis vs lipofibroma vs other at left medial thigh  Hidradenitis suppurativa Pubic  Chronic and persistent condition with duration or expected duration over one year. Condition is bothersome/symptomatic for patient. Currently flared.  Hidradenitis Suppurativa is a chronic; persistent; non-curable, but treatable condition due to abnormal inflamed sweat glands in the body folds (axilla, inframammary, groin, medial thighs), causing recurrent painful draining cysts and scarring. It can be associated with severe scarring acne and cysts; also abscesses and scarring of scalp. The goal is control and prevention of flares, as it is not curable. Scars are permanent and can be thickened. Treatment may include daily use of topical medication and oral antibiotics.  Oral isotretinoin may also be helpful.  For more severe cases, Humira (a biologic injection) may be prescribed to decrease the inflammatory process and prevent flares.  When indicated, inflamed cysts may also be treated surgically.  Start doxycycline 20 mg twice daily with food as needed for flares.  Start clindamycin solution 2 times daily as needed. Recommend HibaClens or benzoyl peroxide wash in the shower.   Doxycycline should be taken with food to prevent nausea. Do not lay down for 30 minutes after taking. Be cautious with sun exposure and use good sun protection while on this medication. Pregnant  women should not take this medication.   Discussed adding resorcinol 15% topical twice daily to affected areas from compounding pharmacy.   doxycycline (PERIOSTAT) 20 MG tablet - Pubic Take 1 tablet (20 mg total) by mouth 2 (two) times daily.  clindamycin (CLEOCIN T) 1 % external solution - Pubic Apply topically 2 (two) times daily.  Seborrheic dermatitis Scalp  Chronic condition with duration or expected duration over one year. Currently well-controlled.  Continue TMC 0.1% lotion 1-2 times daily as needed for itch at scalp. Avoid applying to face, groin, and axilla. Use as directed. Long-term use can cause thinning of the skin.  If not improving can use clindamycin solution 1-2 times daily as needed for itch.   Topical steroids (such as triamcinolone, fluocinolone, fluocinonide, mometasone, clobetasol, halobetasol, betamethasone, hydrocortisone) can cause thinning and lightening of the skin if they are used for too long in the same area. Your physician has selected the right strength medicine for your problem and area affected on the body. Please use your medication only as directed by your physician to prevent side effects.     Related Medications triamcinolone lotion (KENALOG) 0.1 % Apply 1 application topically 2 (two) times daily. To itchy areas of scalp and ears. Can use for up to 2 weeks at ears.Avoid applying to face, groin, and axilla. Use as directed.  ketoconazole (NIZORAL) 2 % shampoo Apply 1 application topically See admin instructions. apply three times per week, massage into scalp and leave in for 10 minutes before rinsing out  Lentigines - Scattered tan macules - Due to sun exposure - Benign-appearing, observe - Recommend daily broad spectrum sunscreen SPF 30+ to sun-exposed areas, reapply every 2 hours as needed. - Call for any changes  Seborrheic Keratoses - Stuck-on, waxy, tan-brown papules and/or plaques  - Benign-appearing - Discussed benign etiology and  prognosis. - Observe - Call for any changes  Melanocytic Nevi - Tan-brown and/or pink-flesh-colored symmetric macules and papules - Benign appearing on exam today - Observation - Call clinic for new or changing moles - Recommend daily use of broad spectrum spf 30+ sunscreen to sun-exposed areas.   Hemangiomas - Red papules - Discussed benign nature - Observe - Call for any changes  Actinic Damage - Chronic condition, secondary to cumulative UV/sun exposure - diffuse scaly erythematous macules with underlying dyspigmentation - Recommend daily broad spectrum sunscreen SPF 30+ to sun-exposed areas, reapply every 2 hours as needed.  - Staying in the shade or wearing long sleeves, sun glasses (UVA+UVB protection) and wide brim hats (4-inch brim around the entire circumference of the hat) are also recommended for sun protection.  - Call for new or changing lesions.  Skin cancer screening performed today.  Purpura - Chronic; persistent and recurrent.  Treatable, but not curable. - Violaceous macules and patches - Benign - Related to trauma, age, sun damage and/or use of blood thinners, chronic use of topical and/or oral steroids - Observe - Can use OTC arnica containing moisturizer such as Dermend Bruise Formula if desired - Call for worsening or other concerns  Nevus Spilus - Brown macules or papules within lighter tan patch - Genetic - Benign, observe - Call for any changes  Return in about 1 year (around 11/13/2022) for TBSE.  Graciella Belton, RMA, am acting as scribe for Forest Gleason, MD .  Documentation: I have reviewed the above documentation for accuracy and completeness, and I agree with the above.  Forest Gleason, MD

## 2021-11-24 ENCOUNTER — Telehealth: Payer: Self-pay

## 2021-11-24 NOTE — Telephone Encounter (Signed)
Discussed pathology results. Patient voiced understanding.  

## 2021-11-24 NOTE — Telephone Encounter (Signed)
-----   Message from Alfonso Patten, MD sent at 11/24/2021  9:32 AM EST ----- 1. Skin , left forearm EPIDERMAL INCLUSION CYST 2. Skin (M), mid anterior neck EPIDERMAL INCLUSION CYST  Normal cyst, no treatment needed  MAs please call. Thank you!

## 2021-12-13 ENCOUNTER — Encounter: Payer: Self-pay | Admitting: Emergency Medicine

## 2021-12-13 ENCOUNTER — Emergency Department
Admission: EM | Admit: 2021-12-13 | Discharge: 2021-12-13 | Discharge status: Against Medical Advice | Payer: Managed Care, Other (non HMO) | Source: Home / Self Care

## 2021-12-13 ENCOUNTER — Emergency Department: Payer: Managed Care, Other (non HMO)

## 2021-12-13 DIAGNOSIS — R1110 Vomiting, unspecified: Secondary | ICD-10-CM | POA: Insufficient documentation

## 2021-12-13 DIAGNOSIS — Z5321 Procedure and treatment not carried out due to patient leaving prior to being seen by health care provider: Secondary | ICD-10-CM | POA: Insufficient documentation

## 2021-12-13 DIAGNOSIS — I214 Non-ST elevation (NSTEMI) myocardial infarction: Secondary | ICD-10-CM | POA: Diagnosis not present

## 2021-12-13 DIAGNOSIS — R072 Precordial pain: Secondary | ICD-10-CM | POA: Insufficient documentation

## 2021-12-13 DIAGNOSIS — R0602 Shortness of breath: Secondary | ICD-10-CM | POA: Diagnosis not present

## 2021-12-13 LAB — CBC
HCT: 44.1 % (ref 36.0–46.0)
Hemoglobin: 14.3 g/dL (ref 12.0–15.0)
MCH: 30.8 pg (ref 26.0–34.0)
MCHC: 32.4 g/dL (ref 30.0–36.0)
MCV: 95 fL (ref 80.0–100.0)
Platelets: 337 10*3/uL (ref 150–400)
RBC: 4.64 MIL/uL (ref 3.87–5.11)
RDW: 13.8 % (ref 11.5–15.5)
WBC: 10.2 10*3/uL (ref 4.0–10.5)
nRBC: 0 % (ref 0.0–0.2)

## 2021-12-13 LAB — BASIC METABOLIC PANEL
Anion gap: 8 (ref 5–15)
BUN: 26 mg/dL — ABNORMAL HIGH (ref 6–20)
CO2: 27 mmol/L (ref 22–32)
Calcium: 9.1 mg/dL (ref 8.9–10.3)
Chloride: 104 mmol/L (ref 98–111)
Creatinine, Ser: 0.84 mg/dL (ref 0.44–1.00)
GFR, Estimated: >60 mL/min (ref >60)
Glucose, Bld: 142 mg/dL — ABNORMAL HIGH (ref 70–99)
Potassium: 3.7 mmol/L (ref 3.5–5.1)
Sodium: 139 mmol/L (ref 135–145)

## 2021-12-13 LAB — TROPONIN I (HIGH SENSITIVITY): Troponin I (High Sensitivity): 18 ng/L — ABNORMAL HIGH (ref <18)

## 2021-12-13 NOTE — ED Notes (Signed)
Pt came to the first desk to inform this RN that is requesting to

## 2021-12-13 NOTE — ED Notes (Signed)
Pt to the front desk requesting her IV to be taken out to leave. She states she feels better and wants to go outside freely for a smoke break. Pt informed of the process after triage and that it was recommended to see a provider before leaving and the patient declined. IV removed; bleeding controlled with gauze. Pt advised to return if symptoms worsen.

## 2021-12-13 NOTE — ED Triage Notes (Signed)
Pt presents via EMS with complaints of Midsternal CP that started this AM. Pt received '4mg'$  zofran and '324mg'$  ASA from EMS and had minimal improvement in her sx. Hx of GERD - states the pain feels different than her reflux sx. Of note, the patient felt flushed and dizzy prior to vomiting. Endorses having 1 alcoholic beverage yesterday. Denies fevers, chills, diarrhea, SOB.

## 2021-12-14 ENCOUNTER — Other Ambulatory Visit: Payer: Self-pay

## 2021-12-14 ENCOUNTER — Encounter: Payer: Self-pay | Admitting: Internal Medicine

## 2021-12-14 ENCOUNTER — Emergency Department: Payer: Managed Care, Other (non HMO)

## 2021-12-14 ENCOUNTER — Inpatient Hospital Stay
Admission: EM | Admit: 2021-12-14 | Discharge: 2021-12-17 | DRG: 282 | Disposition: A | Discharge status: Home / Self Care | Payer: Managed Care, Other (non HMO) | Attending: Internal Medicine | Admitting: Internal Medicine

## 2021-12-14 ENCOUNTER — Ambulatory Visit: Payer: Managed Care, Other (non HMO) | Admitting: Internal Medicine

## 2021-12-14 ENCOUNTER — Other Ambulatory Visit
Admission: RE | Admit: 2021-12-14 | Discharge: 2021-12-14 | Disposition: A | Discharge status: Home / Self Care | Payer: Managed Care, Other (non HMO) | Source: Ambulatory Visit | Attending: Internal Medicine | Admitting: *Deleted

## 2021-12-14 VITALS — BP 116/78 | HR 125 | Temp 98.3°F | Resp 16 | Ht 68.0 in | Wt 177.3 lb

## 2021-12-14 DIAGNOSIS — I214 Non-ST elevation (NSTEMI) myocardial infarction: Secondary | ICD-10-CM | POA: Insufficient documentation

## 2021-12-14 DIAGNOSIS — Z789 Other specified health status: Secondary | ICD-10-CM | POA: Insufficient documentation

## 2021-12-14 DIAGNOSIS — Z880 Allergy status to penicillin: Secondary | ICD-10-CM | POA: Diagnosis not present

## 2021-12-14 DIAGNOSIS — R0602 Shortness of breath: Secondary | ICD-10-CM | POA: Diagnosis present

## 2021-12-14 DIAGNOSIS — R7989 Other specified abnormal findings of blood chemistry: Secondary | ICD-10-CM

## 2021-12-14 DIAGNOSIS — Z881 Allergy status to other antibiotic agents status: Secondary | ICD-10-CM

## 2021-12-14 DIAGNOSIS — F419 Anxiety disorder, unspecified: Secondary | ICD-10-CM | POA: Diagnosis present

## 2021-12-14 DIAGNOSIS — Z8249 Family history of ischemic heart disease and other diseases of the circulatory system: Secondary | ICD-10-CM | POA: Diagnosis not present

## 2021-12-14 DIAGNOSIS — Z79899 Other long term (current) drug therapy: Secondary | ICD-10-CM | POA: Diagnosis not present

## 2021-12-14 DIAGNOSIS — Z716 Tobacco abuse counseling: Secondary | ICD-10-CM | POA: Insufficient documentation

## 2021-12-14 DIAGNOSIS — F17210 Nicotine dependence, cigarettes, uncomplicated: Secondary | ICD-10-CM | POA: Diagnosis present

## 2021-12-14 DIAGNOSIS — Z720 Tobacco use: Secondary | ICD-10-CM | POA: Diagnosis not present

## 2021-12-14 DIAGNOSIS — Z825 Family history of asthma and other chronic lower respiratory diseases: Secondary | ICD-10-CM | POA: Diagnosis not present

## 2021-12-14 DIAGNOSIS — I10 Essential (primary) hypertension: Secondary | ICD-10-CM | POA: Diagnosis present

## 2021-12-14 DIAGNOSIS — Z888 Allergy status to other drugs, medicaments and biological substances status: Secondary | ICD-10-CM | POA: Diagnosis not present

## 2021-12-14 DIAGNOSIS — Z803 Family history of malignant neoplasm of breast: Secondary | ICD-10-CM

## 2021-12-14 DIAGNOSIS — F1090 Alcohol use, unspecified, uncomplicated: Secondary | ICD-10-CM | POA: Insufficient documentation

## 2021-12-14 DIAGNOSIS — K219 Gastro-esophageal reflux disease without esophagitis: Secondary | ICD-10-CM

## 2021-12-14 DIAGNOSIS — K648 Other hemorrhoids: Secondary | ICD-10-CM | POA: Diagnosis present

## 2021-12-14 DIAGNOSIS — E7849 Other hyperlipidemia: Secondary | ICD-10-CM | POA: Diagnosis not present

## 2021-12-14 DIAGNOSIS — F32A Depression, unspecified: Secondary | ICD-10-CM | POA: Diagnosis present

## 2021-12-14 DIAGNOSIS — Z83438 Family history of other disorder of lipoprotein metabolism and other lipidemia: Secondary | ICD-10-CM | POA: Diagnosis not present

## 2021-12-14 DIAGNOSIS — Z87442 Personal history of urinary calculi: Secondary | ICD-10-CM | POA: Diagnosis present

## 2021-12-14 DIAGNOSIS — Z882 Allergy status to sulfonamides status: Secondary | ICD-10-CM | POA: Diagnosis not present

## 2021-12-14 LAB — CBC WITH DIFFERENTIAL/PLATELET
Abs Immature Granulocytes: 0.03 10*3/uL (ref 0.00–0.07)
Basophils Absolute: 0.1 10*3/uL (ref 0.0–0.1)
Basophils Relative: 1 %
Eosinophils Absolute: 0.1 10*3/uL (ref 0.0–0.5)
Eosinophils Relative: 1 %
HCT: 47.4 % — ABNORMAL HIGH (ref 36.0–46.0)
Hemoglobin: 15.5 g/dL — ABNORMAL HIGH (ref 12.0–15.0)
Immature Granulocytes: 0 %
Lymphocytes Relative: 26 %
Lymphs Abs: 2.7 10*3/uL (ref 0.7–4.0)
MCH: 31.4 pg (ref 26.0–34.0)
MCHC: 32.7 g/dL (ref 30.0–36.0)
MCV: 96.1 fL (ref 80.0–100.0)
Monocytes Absolute: 0.6 10*3/uL (ref 0.1–1.0)
Monocytes Relative: 5 %
Neutro Abs: 6.8 10*3/uL (ref 1.7–7.7)
Neutrophils Relative %: 67 %
Platelets: 319 10*3/uL (ref 150–400)
RBC: 4.93 MIL/uL (ref 3.87–5.11)
RDW: 13.7 % (ref 11.5–15.5)
WBC: 10.2 10*3/uL (ref 4.0–10.5)
nRBC: 0 % (ref 0.0–0.2)

## 2021-12-14 LAB — TROPONIN I (HIGH SENSITIVITY)
Troponin I (High Sensitivity): 261 ng/L (ref <18)
Troponin I (High Sensitivity): 274 ng/L (ref <18)
Troponin I (High Sensitivity): 276 ng/L (ref <18)

## 2021-12-14 LAB — COMPREHENSIVE METABOLIC PANEL
ALT: 20 U/L (ref 0–44)
AST: 27 U/L (ref 15–41)
Albumin: 4.3 g/dL (ref 3.5–5.0)
Alkaline Phosphatase: 92 U/L (ref 38–126)
Anion gap: 7 (ref 5–15)
BUN: 17 mg/dL (ref 6–20)
CO2: 27 mmol/L (ref 22–32)
Calcium: 9.3 mg/dL (ref 8.9–10.3)
Chloride: 106 mmol/L (ref 98–111)
Creatinine, Ser: 0.85 mg/dL (ref 0.44–1.00)
GFR, Estimated: >60 mL/min (ref >60)
Glucose, Bld: 140 mg/dL — ABNORMAL HIGH (ref 70–99)
Potassium: 3.7 mmol/L (ref 3.5–5.1)
Sodium: 140 mmol/L (ref 135–145)
Total Bilirubin: 0.4 mg/dL (ref 0.3–1.2)
Total Protein: 7.3 g/dL (ref 6.5–8.1)

## 2021-12-14 LAB — PROTIME-INR
INR: 0.9 (ref 0.8–1.2)
Prothrombin Time: 12.4 seconds (ref 11.4–15.2)

## 2021-12-14 LAB — APTT: aPTT: 27 seconds (ref 24–36)

## 2021-12-14 MED ORDER — ALBUTEROL SULFATE (2.5 MG/3ML) 0.083% IN NEBU: 3.0000 mL | INHALATION_SOLUTION | Freq: Four times a day (QID) | RESPIRATORY_TRACT | Status: DC | PRN

## 2021-12-14 MED ORDER — LORAZEPAM 0.5 MG PO TABS
0.5000 mg | ORAL_TABLET | Freq: Four times a day (QID) | ORAL | Status: AC | PRN
  Administered 2021-12-14 – 2021-12-15 (×2): 0.5 mg via ORAL
  Filled 2021-12-14 (×2): qty 1

## 2021-12-14 MED ORDER — MORPHINE SULFATE (PF) 2 MG/ML IV SOLN: 2.0000 mg | INTRAVENOUS | Status: DC | PRN

## 2021-12-14 MED ORDER — SENNOSIDES-DOCUSATE SODIUM 8.6-50 MG PO TABS: 1.0000 | ORAL_TABLET | Freq: Every evening | ORAL | Status: DC | PRN

## 2021-12-14 MED ORDER — ONDANSETRON HCL 4 MG PO TABS: 4.0000 mg | ORAL_TABLET | Freq: Four times a day (QID) | ORAL | Status: DC | PRN

## 2021-12-14 MED ORDER — NICOTINE 21 MG/24HR TD PT24
21.0000 mg | MEDICATED_PATCH | Freq: Every day | TRANSDERMAL | Status: DC | PRN
  Administered 2021-12-14 – 2021-12-16 (×2): 21 mg via TRANSDERMAL
  Filled 2021-12-14: qty 1

## 2021-12-14 MED ORDER — ASPIRIN 81 MG PO CHEW
324.0000 mg | CHEWABLE_TABLET | Freq: Once | ORAL | Status: AC
  Administered 2021-12-14: 324 mg via ORAL
  Filled 2021-12-14: qty 4

## 2021-12-14 MED ORDER — PANTOPRAZOLE SODIUM 40 MG PO TBEC: 40.0000 mg | DELAYED_RELEASE_TABLET | Freq: Every day | ORAL | 1 refills | Status: DC

## 2021-12-14 MED ORDER — ACETAMINOPHEN 650 MG RE SUPP: 650.0000 mg | Freq: Four times a day (QID) | RECTAL | Status: DC | PRN

## 2021-12-14 MED ORDER — ONDANSETRON HCL 4 MG/2ML IJ SOLN: 4.0000 mg | Freq: Four times a day (QID) | INTRAMUSCULAR | Status: DC | PRN

## 2021-12-14 MED ORDER — PANTOPRAZOLE SODIUM 40 MG PO TBEC
40.0000 mg | DELAYED_RELEASE_TABLET | Freq: Two times a day (BID) | ORAL | Status: DC
  Administered 2021-12-14 – 2021-12-17 (×5): 40 mg via ORAL
  Filled 2021-12-14 (×5): qty 1

## 2021-12-14 MED ORDER — NITROGLYCERIN 2 % TD OINT: 1.0000 [in_us] | TOPICAL_OINTMENT | Freq: Four times a day (QID) | TRANSDERMAL | Status: DC | PRN

## 2021-12-14 MED ORDER — LORAZEPAM 2 MG/ML IJ SOLN: 0.5000 mg | Freq: Four times a day (QID) | INTRAMUSCULAR | Status: AC | PRN

## 2021-12-14 MED ORDER — SODIUM CHLORIDE 0.9% FLUSH
3.0000 mL | Freq: Two times a day (BID) | INTRAVENOUS | Status: DC
  Administered 2021-12-15 – 2021-12-17 (×4): 3 mL via INTRAVENOUS

## 2021-12-14 MED ORDER — HYDRALAZINE HCL 20 MG/ML IJ SOLN: 5.0000 mg | Freq: Three times a day (TID) | INTRAMUSCULAR | Status: DC | PRN

## 2021-12-14 MED ORDER — HEPARIN SODIUM (PORCINE) 5000 UNIT/ML IJ SOLN
4000.0000 [IU] | Freq: Once | INTRAMUSCULAR | Status: AC
  Administered 2021-12-14: 4000 [IU] via INTRAVENOUS

## 2021-12-14 MED ORDER — HEPARIN (PORCINE) 25000 UT/250ML-% IV SOLN
1250.0000 [IU]/h | INTRAVENOUS | Status: DC
  Administered 2021-12-14: 950 [IU]/h via INTRAVENOUS
  Administered 2021-12-15: 1100 [IU]/h via INTRAVENOUS
  Filled 2021-12-14 (×3): qty 250

## 2021-12-14 MED ORDER — ACETAMINOPHEN 325 MG PO TABS: 650.0000 mg | ORAL_TABLET | Freq: Four times a day (QID) | ORAL | Status: DC | PRN

## 2021-12-14 MED ORDER — AZELASTINE HCL 0.1 % NA SOLN
1.0000 | Freq: Two times a day (BID) | NASAL | Status: DC
  Administered 2021-12-15 (×2): 1 via NASAL
  Filled 2021-12-14: qty 30

## 2021-12-14 NOTE — ED Triage Notes (Signed)
Was here on Sunday but left after waiting because she started feeling better.  Reports she was having chest pain Sunday early morning.  Went to PCP today and had repeat blood work and troponin was 261./  Patient reports an ache in her chest but thought it was her sinus infection moving into her chest.

## 2021-12-14 NOTE — Assessment & Plan Note (Signed)
-   With T wave inversion in leads I and aVL compared to EKG on 12/13/2021 - Heparin GTT per pharmacy will be continued - Cardiology has been consulted to Dr. Clayborn Bigness, [facility] clinic cardiology - N.p.o. after midnight except for sips of meds - Admit to telemetry cardiac, inpatient

## 2021-12-14 NOTE — ED Provider Notes (Signed)
[facility] Provider Note    None    (approximate)   History   Chest Pain   HPI  Brianna Huff is a 56 y.o. female who woke up yesterday at 4 in the morning with pain in her chest and pain in the right arm.  She came to the hospital and had some aspirin and the pain got better and she went home.  Troponin at that time was 18.  She went to see her doctor today and had a repeat troponin was 261 came here troponin again was 274.  Pain is now essentially gone.  She is not having any shortness of breath or nausea or any other symptoms.  She is tachycardic with a heart rate ranging between 118 and 134.      Physical Exam   Triage Vital Signs: ED Triage Vitals  Enc Vitals Group     BP 12/14/21 1451 107/81     Pulse Rate 12/14/21 1451 (!) 134     Resp 12/14/21 1451 18     Temp 12/14/21 1451 98.7 F (37.1 C)     Temp Source 12/14/21 1451 Oral     SpO2 12/14/21 1451 97 %     Weight 12/14/21 1450 177 lb (80.3 kg)     Height 12/14/21 1450 '5\' 8"'$  (1.727 m)     Head Circumference --      Peak Flow --      Pain Score 12/14/21 1450 2     Pain Loc --      Pain Edu? --      Excl. in [place]? --     Most recent vital signs: Vitals:   12/14/21 1451  BP: 107/81  Pulse: (!) 134  Resp: 18  Temp: 98.7 F (37.1 C)  SpO2: 97%     General: Awake, no distress.  CV:  Good peripheral perfusion.  Heart regular rate and rhythm no audible murmurs but tachycardic Resp:  Normal effort.  Lungs are clear Abd:  No distention.  Soft nontender no organomegaly Extremities with no tenderness   ED Results / Procedures / Treatments   Labs (all labs ordered are listed, but only abnormal results are displayed) Labs Reviewed  COMPREHENSIVE METABOLIC PANEL - Abnormal; Notable for the following components:      Result Value   Glucose, Bld 140 (*)    All other components within normal limits  CBC WITH DIFFERENTIAL/PLATELET - Abnormal; Notable for the following components:    Hemoglobin 15.5 (*)    HCT 47.4 (*)    All other components within normal limits  TROPONIN I (HIGH SENSITIVITY) - Abnormal; Notable for the following components:   Troponin I (High Sensitivity) 274 (*)    All other components within normal limits  PROTIME-INR  APTT     EKG  EKG read interpreted by me shows sinus tachycardia rate of 121 normal axis flipped T's in 1 and L which are new from yesterday.   RADIOLOGY Chest x-ray read by radiology reviewed and interpreted by me shows no acute disease  PROCEDURES:  Critical Care performed:   Procedures   MEDICATIONS ORDERED IN ED: Medications  aspirin chewable tablet 324 mg (has no administration in time range)  heparin injection 4,000 Units (has no administration in time range)  heparin ADULT infusion 100 units/mL (25000 units/290m) (has no administration in time range)     IMPRESSION / MDM / ASSESSMENT AND PLAN / ED COURSE  I reviewed the triage vital  signs and the nursing notes.  Differential diagnosis includes, but is not limited to, NSTEMI.  There are no symptoms suggestive of PE.  There is no sign of a STEMI.  Patient's presentation is most consistent with acute presentation with potential threat to life or bodily function.  The patient is on the cardiac monitor to evaluate for evidence of arrhythmia and/or significant heart rate changes.  Only sinus tachycardia has been seen      FINAL CLINICAL IMPRESSION(S) / ED DIAGNOSES   Final diagnoses:  NSTEMI (non-ST elevated myocardial infarction) ([place])     Rx / DC Orders   ED Discharge Orders     None        Note:  This document was prepared using Dragon voice recognition software and may include unintentional dictation errors.   Nena Polio, MD 12/14/21 [phone]

## 2021-12-14 NOTE — ED Provider Triage Note (Signed)
Emergency Medicine Provider Triage Evaluation Note  Brianna Huff , a 56 y.o. female  was evaluated in triage.  Pt complains of chest discomfort, elevated troponin at her doctor's office of 218 point.  Review of Systems  Positive:  Negative:   Physical Exam  BP 107/81 (BP Location: Left Arm)   Pulse (!) 134   Temp 98.7 F (37.1 C) (Oral)   Resp 18   Ht '5\' 8"'$  (1.727 m)   Wt 80.3 kg   SpO2 97%   BMI 26.91 kg/m  Gen:   Awake, no distress   Resp:  Normal effort  MSK:   Moves extremities without difficulty  Other:    Medical Decision Making  Medically screening exam initiated at 2:53 PM.  Appropriate orders placed.  Brianna Huff was informed that the remainder of the evaluation will be completed by another provider, this initial triage assessment does not replace that evaluation, and the importance of remaining in the ED until their evaluation is complete.     Versie Starks, PA-C 12/14/21 1453

## 2021-12-14 NOTE — Assessment & Plan Note (Signed)
-   Patient drinks 3-4 drinks per night with cooking - She denies history of seizure and or withdrawal symptoms as she does not believe she is addicted - She has had increased use of alcohol after her mother passed away and now she is taking care of her father - Counseling given for decreasing alcohol intake down to 3-4 drinks and definitely less than 6 drinks per week - Patient endorses understanding and compliance

## 2021-12-14 NOTE — Assessment & Plan Note (Signed)
With nicotine dependence - As needed nicotine patch ordered for nicotine craving

## 2021-12-14 NOTE — Assessment & Plan Note (Addendum)
-   Greater than 5 minutes spent counseling patient on tobacco cessation - Tobacco cessation counseling given:  Week one, smoke 19 cigarettes per day. Week two, smoke 18 cigarettes per day. Week three, smoke 17 cigarettes per day, continue until smoking half the amount of cigarettes per day Discuss with PCP for pharmacologic assistance with smoking cessation Clean all indoor clothing, sheets, blankets, and freshen textile furniture to rid the smell of cigarettes Only smoke outside  Leave cigarettes and lighters outside in separate places, making it difficult for her to smoke Avoid prolong interactions with individuals actively smoking cigarettes If you smoke in social setting, avoid social setting where cigarette smoking is expected or considered acceptable  Continued tobacco use and nicotine use will worsen existing coronary artery disease or expedite heart disease process. Call [facility] if in need of nicotine patches to help with cessation

## 2021-12-14 NOTE — Assessment & Plan Note (Signed)
-   Ativan 0.5 mg p.o./IV every 6 hours as needed for anxiety, 2 doses ordered

## 2021-12-14 NOTE — Hospital Course (Addendum)
Ms. Brendalyn Vallely is a 56 year old female with history of GERD, who presents emergency department for chief concerns of chest pain.  Initial vitals in the emergency department showed temperature of 98.3, respiration rate of 16, heart rate of 125, blood pressure 116/78, SpO2 97% on room air.  Serum sodium is 140, potassium 3.7, chloride 106, bicarb 27, nonfasting glucose 140, BUN of 17, serum creatinine of 0.85, EGFR greater than 60.  WBC 10.2, hemoglobin 15.5, platelets of 319.  ED treatment: Aspirin 324 mg p.o. one-time dose, heparin bolus and gtt.

## 2021-12-14 NOTE — Progress Notes (Signed)
   Acute Office Visit  Subjective:     Patient ID: Brianna Huff, female    DOB: [birth year], 56 y.o.   MRN: [id number]  Chief Complaint  Patient presents with   Chest Pain    HPI Patient is in today for chest pain.  She states she started experiencing mid sternal chest pain and accompanying right arm pain after dinner Saturday night.  She called EMS and was taken to the ER.  She was given Zofran and aspirin which minimally improved her symptoms.  She felt shaky, nauseous and diaphoretic at the time.  She was able to vomit once.  EKG in the ER showed normal sinus rhythm however high-sensitivity troponin mildly elevated at 18.  Chest x-ray normal.  CBC and BMP without abnormalities with the exception of blood glucose 124.  Today she states her symptoms are improving.  She recently was treated with Cipro for a sinus infection as well as prednisone.  She states since being on the prednisone she has felt more irritated, shaky, having a hard time sleeping.  Today she denies chest pain, shortness of breath but still feels a tightness in her chest, no cough or fever.   Review of Systems  Constitutional:  Negative for chills and fever.  Eyes:  Negative for blurred vision.  Respiratory:  Negative for cough and shortness of breath.   Cardiovascular:  Negative for chest pain and palpitations.        Objective:    BP 116/78   Pulse (!) 125   Temp 98.3 F (36.8 C)   Resp 16   Ht '5\' 8"'$  (1.727 m)   Wt 177 lb 4.8 oz (80.4 kg)   SpO2 97%   BMI 26.96 kg/m  BP Readings from Last 3 Encounters:  12/14/21 116/78  12/13/21 (!) 148/72  11/09/21 122/74   Wt Readings from Last 3 Encounters:  12/14/21 177 lb 4.8 oz (80.4 kg)  12/13/21 180 lb (81.6 kg)  11/09/21 175 lb 6.4 oz (79.6 kg)      Physical Exam Constitutional:      Appearance: Normal appearance.  HENT:     Head: Normocephalic and atraumatic.  Eyes:     Conjunctiva/sclera: Conjunctivae normal.  Cardiovascular:     Rate and  Rhythm: Regular rhythm. Tachycardia present.  Pulmonary:     Effort: Pulmonary effort is normal.     Breath sounds: Normal breath sounds. No wheezing, rhonchi or rales.  Skin:    General: Skin is warm and dry.  Neurological:     General: No focal deficit present.     Mental Status: She is alert. Mental status is at baseline.  Psychiatric:        Mood and Affect: Mood normal.        Behavior: Behavior normal.     No results found for any visits on 12/14/21.      Assessment & Plan:   1. Elevated troponin: Chest pain resolving, still feeling a chest tightness and tachycardia but this could be secondary to steroids.  Her troponin was elevated to 18 in the ER, will be sent to the hospital for stat repeat.  - Troponin I (High Sensitivity); Future  2. Gastroesophageal reflux disease, unspecified whether esophagitis present: Controlled, Protonix 40 mg refilled.  - pantoprazole (PROTONIX) 40 MG tablet; Take 1 tablet (40 mg total) by mouth daily.  Dispense: 90 tablet; Refill: 1  Return if symptoms worsen or fail to improve.  Teodora Medici, DO

## 2021-12-14 NOTE — H&P (Addendum)
History and Physical   Brianna Huff EXH:[id number] DOB: [birth year] DOA: 12/14/2021  PCP: Teodora Medici, DO  Patient coming from: Home  I have personally briefly reviewed patient's old medical records in [place].  Chief Concern: Chest pain, elevated troponin  HPI: Brianna Huff is a 56 year old female with history of GERD, who presents emergency department for chief concerns of chest pain.  Initial vitals in the emergency department showed temperature of 98.3, respiration rate of 16, heart rate of 125, blood pressure 116/78, SpO2 97% on room air.  Serum sodium is 140, potassium 3.7, chloride 106, bicarb 27, nonfasting glucose 140, BUN of 17, serum creatinine of 0.85, EGFR greater than 60.  WBC 10.2, hemoglobin 15.5, platelets of 319.  ED treatment: Aspirin 324 mg p.o. one-time dose, heparin bolus and gtt. ------------------------ At bedside patient was able to tell me her name, her age, her current location of hospital.  She reports that she woke up at approximately 4 AM on Sunday morning with chest pain/dull and pain radiating down her right arm.  She reports the pain is pressure-like. She endorses diaphoresis and sat in front of her box fan for relief. She endorses nausea and vomiting one time of small amount of liquid.  She called EMS and was given 4 tablets of aspirin and IV zofran. She presented to the emergency department on 12/13/2021 her pain improved.  She thought the wait was going to be too long in the emergency department and as her chest pain improved with aspirin, she left without being seen by an emergency medicine provider.  Of note her high sensitive troponin was 18 in the ED on 12/10.  She saw her PCP today and her troponin was elevated to 261 and was told to come to the emergency department for further evaluation.    In the ED her troponin was elevated to 274 and on repeat was 276.   She reports that upon waking up on day of admission, she  continued to have chest tightness that is very mild.  She reports she is never felt this way before.  She called her PCPs office and was able to get an appointment and be seen at 11 AM on 12/14/2021.  After labs were checked at her PCPs office she was advised to come to the emergency department for further evaluation.  She denies known personal history of heart disease.  Family history: Maternal grandfather had a heart attack in his 53s.  Social history: She lives with her father at home. She endorses daily tobacco smoking, approximately 1 pack/day since she was a teenager. She drinks one mix drink per day, double or triple shots of vodka and about one shot of Kalua cream daily. Her last drink was Saturday.  She denies history of alcohol withdrawal and or seizure related to alcohol cessation.  She denies recreational drug use.  She currently works in [employer], medical.  ROS: Constitutional: no weight change, no fever ENT/Mouth: no sore throat, no rhinorrhea Eyes: no eye pain, no vision changes Cardiovascular: + chest pain, no dyspnea,  no edema, no palpitations Respiratory: no cough, no sputum, no wheezing Gastrointestinal: + nausea, no vomiting, no diarrhea, no constipation Genitourinary: no urinary incontinence, no dysuria, no hematuria Musculoskeletal: no arthralgias, no myalgias Skin: no skin lesions, no pruritus, Neuro: + weakness, no loss of consciousness, no syncope Psych: no anxiety, no depression, no decrease appetite Heme/Lymph: no bruising, no bleeding  ED Course: Discussed with emergency medicine provider, patient requiring hospitalization  for chief concerns of NSTEMI.  Assessment/Plan  Principal Problem:   NSTEMI (non-ST elevated myocardial infarction) ([place]) Active Problems:   Cigarette smoker   Hemorrhoids, internal   Anxiety and depression   Gastroesophageal reflux disease   Encounter for smoking cessation counseling   Alcohol use   Assessment and Plan:  * NSTEMI  (non-ST elevated myocardial infarction) ([place]) - With T wave inversion in leads I and aVL compared to EKG on 12/13/2021 - Heparin GTT per pharmacy will be continued - Cardiology has been consulted to Dr. Clayborn Bigness, [facility] clinic cardiology - N.p.o. after midnight except for sips of meds - Admit to telemetry cardiac, inpatient  Alcohol use - Patient drinks 3-4 drinks per night with cooking - She denies history of seizure and or withdrawal symptoms as she does not believe she is addicted - She has had increased use of alcohol after her mother passed away and now she is taking care of her father - Counseling given for decreasing alcohol intake down to 3-4 drinks and definitely less than 6 drinks per week - Patient endorses understanding and compliance  Encounter for smoking cessation counseling - Greater than 5 minutes spent counseling patient on tobacco cessation - Tobacco cessation counseling given:  Week one, smoke 19 cigarettes per day. Week two, smoke 18 cigarettes per day. Week three, smoke 17 cigarettes per day, continue until smoking half the amount of cigarettes per day Discuss with PCP for pharmacologic assistance with smoking cessation Clean all indoor clothing, sheets, blankets, and freshen textile furniture to rid the smell of cigarettes Only smoke outside  Leave cigarettes and lighters outside in separate places, making it difficult for her to smoke Avoid prolong interactions with individuals actively smoking cigarettes If you smoke in social setting, avoid social setting where cigarette smoking is expected or considered acceptable  Continued tobacco use and nicotine use will worsen existing coronary artery disease or expedite heart disease process. Call [place] if in need of nicotine patches to help with cessation  Anxiety and depression - Ativan 0.5 mg p.o./IV every 6 hours as needed for anxiety, 2 doses ordered  Cigarette smoker With nicotine dependence - As  needed nicotine patch ordered for nicotine craving  Chart reviewed.   DVT prophylaxis: Heparin for pharmacy Code Status: Full code Diet: Heart healthy now; n.p.o. after midnight Family Communication: No Disposition Plan: Pending clinical course and cardiology evaluation Consults called: Cardiology, [place] [facility] Admission status: Telemetry cardiac, inpatient  Past Medical History:  Diagnosis Date   Anxiety and depression    Cyst of left kidney    GERD (gastroesophageal reflux disease)    Gross hematuria    Internal hemorrhoids    Kidney stones    Low back pain    Lower abdominal pain    Monilial vaginitis    Nasal congestion    PONV (postoperative nausea and vomiting)    Shortness of breath dyspnea    Vaginitis    Past Surgical History:  Procedure Laterality Date   ABDOMINAL HYSTERECTOMY     still has ovaries    COLONOSCOPY N/A 08/21/2021   Procedure: COLONOSCOPY;  Surgeon: Lucilla Lame, MD;  Location: [place];  Service: Endoscopy;  Laterality: N/A;   CYSTOSCOPY W/ RETROGRADES Bilateral 11/26/2014   Procedure: CYSTOSCOPY WITH RETROGRADE PYELOGRAM;  Surgeon: Hollice Espy, MD;  Location: [facility];  Service: Urology;  Laterality: Bilateral;   CYSTOSCOPY WITH STENT PLACEMENT Left 11/13/2014   Procedure: CYSTOSCOPY, left retrograde pyelogram WITH left ureteral STENT PLACEMENT;  Surgeon: Cleon Gustin, MD;  Location: [facility];  Service: Urology;  Laterality: Left;   CYSTOSCOPY/URETEROSCOPY/HOLMIUM LASER/STENT PLACEMENT Left 11/26/2014   Procedure: CYSTOSCOPY/URETEROSCOPY/HOLMIUM LASER/STENT PLACEMENT;  Surgeon: Hollice Espy, MD;  Location: [facility];  Service: Urology;  Laterality: Left;   ESOPHAGOGASTRODUODENOSCOPY N/A 08/21/2021   Procedure: ESOPHAGOGASTRODUODENOSCOPY (EGD);  Surgeon: Lucilla Lame, MD;  Location: [place];  Service: Endoscopy;  Laterality: N/A;   NASAL SINUS SURGERY     POLYPECTOMY  08/21/2021   Procedure: POLYPECTOMY;  Surgeon:  Lucilla Lame, MD;  Location: [place];  Service: Endoscopy;;   TUBAL LIGATION     Social History:  reports that she has been smoking cigarettes. She has a 35.00 pack-year smoking history. She has never used smokeless tobacco. She reports current alcohol use of about 4.0 standard drinks of alcohol per week. She reports that she does not use drugs.  Allergies  Allergen Reactions   2,4-D Dimethylamine Shortness Of Breath   Sulfa Antibiotics Itching and Shortness Of Breath   Azithromycin Other (See Comments)    constipation   Penicillin G Other (See Comments)    Other reaction(s): Unknown Has patient had a PCN reaction causing immediate rash, facial/tongue/throat swelling, SOB or lightheadedness with hypotension: pt not sure Has patient had a PCN reaction causing severe rash involving mucus membranes or skin necrosis: pt not sure Has patient had a PCN reaction that required hospitalization  pt not sure Has patient had a PCN reaction occurring within the last 10 years: pt not sure If all    Omnicef [Cefdinir] Itching and Palpitations   Family History  Problem Relation Age of Onset   Hyperlipidemia Mother    Hypertension Mother    Breast cancer Mother    Asthma Father    Prostate cancer Neg Hx    Kidney cancer Neg Hx    Bladder Cancer Neg Hx    Family history: Family history reviewed and not pertinent  Prior to Admission medications   Medication Sig Start Date End Date Taking? Authorizing Provider  albuterol (VENTOLIN HFA) 108 (90 Base) MCG/ACT inhaler TAKE 2 PUFFS BY MOUTH EVERY 6 HOURS AS NEEDED FOR WHEEZE OR SHORTNESS OF BREATH 07/29/21   Bo Merino, FNP  azelastine (ASTELIN) 0.1 % nasal spray Place 1 spray into both nostrils 2 (two) times daily. Use in each nostril as directed 11/09/21   Teodora Medici, DO  cetirizine (ZYRTEC) 10 MG tablet Take 1 tablet (10 mg total) by mouth daily. 11/09/21   Teodora Medici, DO  clindamycin (CLEOCIN T) 1 % external solution  Apply topically 2 (two) times daily. 11/12/21 11/12/22  Moye, [place], MD  fluticasone (FLONASE) 50 MCG/ACT nasal spray Place 2 sprays into both nostrils daily. Patient taking differently: Place 2 sprays into both nostrils daily. OTC 01/11/21   Hughie Closs, PA-C  ketoconazole (NIZORAL) 2 % shampoo Apply 1 application topically See admin instructions. apply three times per week, massage into scalp and leave in for 10 minutes before rinsing out 10/09/20   Moye, [place], MD  mupirocin ointment (BACTROBAN) 2 % Apply 1 Application topically daily. 11/12/21   Moye, [place], MD  pantoprazole (PROTONIX) 40 MG tablet Take 1 tablet (40 mg total) by mouth daily. 12/14/21   Teodora Medici, DO  triamcinolone lotion (KENALOG) 0.1 % Apply 1 application topically 2 (two) times daily. To itchy areas of scalp and ears. Can use for up to 2 weeks at ears.Avoid applying to face, groin, and axilla. Use as directed. 10/09/20  Laurence Ferrari, [place], MD   Physical Exam: Vitals:   12/14/21 1626 12/14/21 1800 12/14/21 1815 12/14/21 1830  BP: (!) 117/50 125/80  110/89  Pulse: (!) 105 (!) 110  (!) 104  Resp: 18 (!) 21  (!) 21  Temp:   98.4 F (36.9 C)   TempSrc:   Oral   SpO2: 98% 96%  99%  Weight:      Height:       Constitutional: appears age-appropriate, NAD, calm, comfortable Eyes: PERRL, lids and conjunctivae normal ENMT: Mucous membranes are moist. Posterior pharynx clear of any exudate or lesions. Age-appropriate dentition. Hearing appropriate Neck: normal, supple, no masses, no thyromegaly Respiratory: clear to auscultation bilaterally, no wheezing, no crackles. Normal respiratory effort. No accessory muscle use.  Cardiovascular: Regular rate and rhythm, no murmurs / rubs / gallops. No extremity edema. 2+ pedal pulses. No carotid bruits.  Abdomen: no tenderness, no masses palpated, no hepatosplenomegaly. Bowel sounds positive.  Musculoskeletal: no clubbing / cyanosis. No joint deformity upper and lower  extremities. Good ROM, no contractures, no atrophy. Normal muscle tone.  Skin: no rashes, lesions, ulcers. No induration Neurologic: Sensation intact. Strength 5/5 in all 4.  Psychiatric: Normal judgment and insight. Alert and oriented x 3. Normal mood.   EKG: independently reviewed, showing sinus tachycardia with rate of 121, QTc 457, T wave inversion in leads I and aVL  Chest x-ray on Admission: I personally reviewed and I agree with radiologist reading as below.  DG Chest 2 View  Result Date: 12/14/2021 CLINICAL DATA:  Chest pain. EXAM: CHEST - 2 VIEW COMPARISON:  December 13, 2021. FINDINGS: The heart size and mediastinal contours are within normal limits. Both lungs are clear. The visualized skeletal structures are unremarkable. IMPRESSION: No active cardiopulmonary disease. Electronically Signed   By: Marijo Conception M.D.   On: 12/14/2021 15:37   DG Chest 2 View  Result Date: 12/13/2021 CLINICAL DATA:  Chest pain EXAM: CHEST - 2 VIEW COMPARISON:  None available FINDINGS: Normal heart size and mediastinal contours. No acute infiltrate or edema. No effusion or pneumothorax. No acute osseous findings. IMPRESSION: Negative chest. Electronically Signed   By: Jorje Guild M.D.   On: 12/13/2021 05:25    Labs on Admission: I have personally reviewed following labs CBC: Recent Labs  Lab 12/13/21 0511 12/14/21 1454  WBC 10.2 10.2  NEUTROABS  --  6.8  HGB 14.3 15.5*  HCT 44.1 47.4*  MCV 95.0 96.1  PLT 337 382   Basic Metabolic Panel: Recent Labs  Lab 12/13/21 0511 12/14/21 1454  NA 139 140  K 3.7 3.7  CL 104 106  CO2 27 27  GLUCOSE 142* 140*  BUN 26* 17  CREATININE 0.84 0.85  CALCIUM 9.1 9.3   GFR: Estimated Creatinine Clearance: 82.3 mL/min (by C-G formula based on SCr of 0.85 mg/dL).  Liver Function Tests: Recent Labs  Lab 12/14/21 1454  AST 27  ALT 20  ALKPHOS 92  BILITOT 0.4  PROT 7.3  ALBUMIN 4.3   Urine analysis:    Component Value Date/Time    COLORURINE YELLOW 01/31/2021 1632   APPEARANCEUR CLOUDY (A) 01/31/2021 1632   APPEARANCEUR Clear 12/25/2014 1521   LABSPEC 1.020 01/31/2021 1632   PHURINE 8.5 (H) 01/31/2021 1632   GLUCOSEU NEGATIVE 01/31/2021 1632   HGBUR MODERATE (A) 01/31/2021 1632   BILIRUBINUR NEGATIVE 01/31/2021 1632   BILIRUBINUR Negative 12/25/2014 1521   KETONESUR 40 (A) 01/31/2021 1632   PROTEINUR 30 (A) 01/31/2021 1632   NITRITE  NEGATIVE 01/31/2021 1632   LEUKOCYTESUR NEGATIVE 01/31/2021 1632   Dr. Tobie Poet [facility]  If 7PM-7AM, please contact overnight-coverage provider If 7AM-7PM, please contact day coverage provider [email or web address]  12/14/2021, 7:02 PM

## 2021-12-14 NOTE — Consult Note (Signed)
[place] for heparin drip Indication: chest pain/ACS  Allergies  Allergen Reactions   2,4-D Dimethylamine Shortness Of Breath   Sulfa Antibiotics Itching and Shortness Of Breath   Azithromycin Other (See Comments)    constipation   Penicillin G Other (See Comments)    Other reaction(s): Unknown Has patient had a PCN reaction causing immediate rash, facial/tongue/throat swelling, SOB or lightheadedness with hypotension:  pt not sure Has patient had a PCN reaction causing severe rash involving mucus membranes or skin necrosis:  pt not sure Has patient had a PCN reaction that required hospitalization  pt not sure Has patient had a PCN reaction occurring within the last 10 years: pt not sure If all    Omnicef [Cefdinir] Itching and Palpitations     Patient Measurements: Height: '5\' 8"'$  (172.7 cm) Weight: 80.3 kg (177 lb) IBW/kg (Calculated) : 63.9 Heparin Dosing Weight: 80 kg  Vital Signs: Temp: 98.7 F (37.1 C) (12/11 1451) Temp Source: Oral (12/11 1451) BP: 107/81 (12/11 1451) Pulse Rate: 134 (12/11 1451)  Labs: Recent Labs    12/13/21 0511 12/14/21 1205 12/14/21 1454  HGB 14.3  --  15.5*  HCT 44.1  --  47.4*  PLT 337  --  319  CREATININE 0.84  --  0.85  TROPONINIHS 18* 261* 274*    Estimated Creatinine Clearance: 82.3 mL/min (by C-G formula based on SCr of 0.85 mg/dL).   Medical History: Past Medical History:  Diagnosis Date   Anxiety and depression    Cyst of left kidney    GERD (gastroesophageal reflux disease)    Gross hematuria    Internal hemorrhoids    Kidney stones    Low back pain    Lower abdominal pain    Monilial vaginitis    Nasal congestion    PONV (postoperative nausea and vomiting)    Shortness of breath dyspnea    Vaginitis     Medications:  No anticoagulants per pharmacist review  Assessment: 56 yo female presented from PCP office to ED with chest pain and elevated troponin.  Goal of Therapy:   Heparin level 0.3-0.7 units/ml Monitor platelets by anticoagulation protocol: Yes   Plan:  Give 4000 units bolus x 1 Start heparin infusion at 950 units/hr Check anti-Xa level in 6 hours and daily while on heparin Continue to monitor H&H and platelets  Lorin Picket, PharmD 12/14/2021,4:08 PM

## 2021-12-14 NOTE — Consult Note (Signed)
CARDIOLOGY CONSULT NOTE               Patient ID: Brianna Huff MRN: [id number] DOB/AGE: October 03, 1965 56 y.o.  Admit date: 12/14/2021 Referring Physician Dr. Rupert Stacks hospitalist Primary Physician Dr. Wells Guiles primary Primary Cardiologist  Reason for Consultation chest pain borderline troponins possible unstable angina  HPI: Patient is a 56 year old female history of smoking hypertension GERD mild obesity presents with recurrent chest pain symptoms seen in emergency room the day before but did not stay and left without being seen patient had recurrent chest pain symptoms midsternal rating to the right arm to see family return to the emergency room troponins increased from 18 to about 270 since yesterday patient states he became chest pain-free in the emergency room but was sufficiently concerned with his symptoms that she came back for further evaluation patient has history of smoking denies any previous cardiac history also has a significant history of alcohol consumption no bicuspids or syncope no nausea vomiting or diarrhea patient here for finally to have cardiac assessment performed  Review of systems complete and found to be negative unless listed above     Past Medical History:  Diagnosis Date   Anxiety and depression    Cyst of left kidney    GERD (gastroesophageal reflux disease)    Gross hematuria    Internal hemorrhoids    Kidney stones    Low back pain    Lower abdominal pain    Monilial vaginitis    Nasal congestion    PONV (postoperative nausea and vomiting)    Shortness of breath dyspnea    Vaginitis     Past Surgical History:  Procedure Laterality Date   ABDOMINAL HYSTERECTOMY     still has ovaries    COLONOSCOPY N/A 08/21/2021   Procedure: COLONOSCOPY;  Surgeon: Lucilla Lame, MD;  Location: [facility];  Service: Endoscopy;  Laterality: N/A;   CYSTOSCOPY W/ RETROGRADES Bilateral 11/26/2014   Procedure: CYSTOSCOPY WITH RETROGRADE  PYELOGRAM;  Surgeon: Hollice Espy, MD;  Location: [facility];  Service: Urology;  Laterality: Bilateral;   CYSTOSCOPY WITH STENT PLACEMENT Left 11/13/2014   Procedure: CYSTOSCOPY, left retrograde pyelogram WITH left ureteral STENT PLACEMENT;  Surgeon: Cleon Gustin, MD;  Location: [facility];  Service: Urology;  Laterality: Left;   CYSTOSCOPY/URETEROSCOPY/HOLMIUM LASER/STENT PLACEMENT Left 11/26/2014   Procedure: CYSTOSCOPY/URETEROSCOPY/HOLMIUM LASER/STENT PLACEMENT;  Surgeon: Hollice Espy, MD;  Location: [facility];  Service: Urology;  Laterality: Left;   ESOPHAGOGASTRODUODENOSCOPY N/A 08/21/2021   Procedure: ESOPHAGOGASTRODUODENOSCOPY (EGD);  Surgeon: Lucilla Lame, MD;  Location: [place];  Service: Endoscopy;  Laterality: N/A;   NASAL SINUS SURGERY     POLYPECTOMY  08/21/2021   Procedure: POLYPECTOMY;  Surgeon: Lucilla Lame, MD;  Location: [place];  Service: Endoscopy;;   TUBAL LIGATION      (Not in a hospital admission)  Social History   Socioeconomic History   Marital status: Single    Spouse name: Not on file   Number of children: Not on file   Years of education: Not on file   Highest education level: Not on file  Occupational History   Not on file  Tobacco Use   Smoking status: Every Day    Packs/day: 1.00    Years: 35.00    Total pack years: 35.00    Types: Cigarettes   Smokeless tobacco: Never  Vaping Use   Vaping Use: Never used  Substance and Sexual Activity   Alcohol use: Yes  Alcohol/week: 4.0 standard drinks of alcohol    Types: 4 Standard drinks or equivalent per week   Drug use: No   Sexual activity: Not Currently  Other Topics Concern   Not on file  Social History Narrative   Not on file   Social Determinants of Health   Financial Resource Strain: Not on file  Food Insecurity: Not on file  Transportation Needs: Not on file  Physical Activity: Not on file  Stress: Not on file  Social Connections: Not on file  Intimate  Partner Violence: Not on file    Family History  Problem Relation Age of Onset   Hyperlipidemia Mother    Hypertension Mother    Breast cancer Mother    Asthma Father    Prostate cancer Neg Hx    Kidney cancer Neg Hx    Bladder Cancer Neg Hx       Review of systems complete and found to be negative unless listed above      PHYSICAL EXAM  General: Well developed, well nourished, in no acute distress HEENT:  Normocephalic and atramatic Neck:  No JVD.  Lungs: Clear bilaterally to auscultation and percussion. Heart: HRRR . Normal S1 and S2 without gallops or murmurs.  Abdomen: Bowel sounds are positive, abdomen soft and non-tender  Msk:  Back normal, normal gait. Normal strength and tone for age. Extremities: No clubbing, cyanosis or edema.   Neuro: Alert and oriented X 3. Psych:  Good affect, responds appropriately  Labs:   Lab Results  Component Value Date   WBC 10.2 12/14/2021   HGB 15.5 (H) 12/14/2021   HCT 47.4 (H) 12/14/2021   MCV 96.1 12/14/2021   PLT 319 12/14/2021    Recent Labs  Lab 12/14/21 1454  NA 140  K 3.7  CL 106  CO2 27  BUN 17  CREATININE 0.85  CALCIUM 9.3  PROT 7.3  BILITOT 0.4  ALKPHOS 92  ALT 20  AST 27  GLUCOSE 140*   Lab Results  Component Value Date   TROPONINI <0.03 12/27/2015    Lab Results  Component Value Date   CHOL 220 (H) 04/17/2021   Lab Results  Component Value Date   HDL 59 04/17/2021   Lab Results  Component Value Date   LDLCALC 125 (H) 04/17/2021   Lab Results  Component Value Date   TRIG 216 (H) 04/17/2021   Lab Results  Component Value Date   CHOLHDL 3.7 04/17/2021   No results found for: "LDLDIRECT"    Radiology: DG Chest 2 View  Result Date: 12/14/2021 CLINICAL DATA:  Chest pain. EXAM: CHEST - 2 VIEW COMPARISON:  December 13, 2021. FINDINGS: The heart size and mediastinal contours are within normal limits. Both lungs are clear. The visualized skeletal structures are unremarkable. IMPRESSION: No  active cardiopulmonary disease. Electronically Signed   By: Marijo Conception M.D.   On: 12/14/2021 15:37   DG Chest 2 View  Result Date: 12/13/2021 CLINICAL DATA:  Chest pain EXAM: CHEST - 2 VIEW COMPARISON:  None available FINDINGS: Normal heart size and mediastinal contours. No acute infiltrate or edema. No effusion or pneumothorax. No acute osseous findings. IMPRESSION: Negative chest. Electronically Signed   By: Jorje Guild M.D.   On: 12/13/2021 05:25    EKG: Normal sinus rhythm rate of 88 with nonspecific ST-T wave changes  ASSESSMENT AND PLAN:  Unstable angina stable angina Demand ischemia versus non-STEMI Smoking Hypertension GERD Anxiety History of alcohol abuse . Plan  Agree with  admit to telemetry rule out myocardial infarction follow-up EKGs and troponins Heparin IV for anticoagulation for 24 to 48 hours Consider echocardiogram for assessment of left ventricular function wall motion Consider cardiac cath for evaluation of coronary anatomy with demand ischemia versus non-STEMI elevated troponins Consider beta-blockers and nitrates PPI therapy for reflux Advised patient to refrain from tobacco abuse Consider alcohol cessation therapy consider CIWA protocol Consider statin therapy    Signed: Yolonda Kida MD, 12/14/2021, 11:41 PM

## 2021-12-15 ENCOUNTER — Inpatient Hospital Stay: Payer: Managed Care, Other (non HMO)

## 2021-12-15 DIAGNOSIS — I214 Non-ST elevation (NSTEMI) myocardial infarction: Secondary | ICD-10-CM | POA: Diagnosis not present

## 2021-12-15 LAB — CBC
HCT: 42 % (ref 36.0–46.0)
Hemoglobin: 13.7 g/dL (ref 12.0–15.0)
MCH: 31.1 pg (ref 26.0–34.0)
MCHC: 32.6 g/dL (ref 30.0–36.0)
MCV: 95.5 fL (ref 80.0–100.0)
Platelets: 258 10*3/uL (ref 150–400)
RBC: 4.4 MIL/uL (ref 3.87–5.11)
RDW: 13.6 % (ref 11.5–15.5)
WBC: 9.5 10*3/uL (ref 4.0–10.5)
nRBC: 0 % (ref 0.0–0.2)

## 2021-12-15 LAB — TROPONIN I (HIGH SENSITIVITY)
Troponin I (High Sensitivity): 178 ng/L (ref <18)
Troponin I (High Sensitivity): 184 ng/L (ref <18)

## 2021-12-15 LAB — BASIC METABOLIC PANEL
Anion gap: 7 (ref 5–15)
BUN: 21 mg/dL — ABNORMAL HIGH (ref 6–20)
CO2: 24 mmol/L (ref 22–32)
Calcium: 8.7 mg/dL — ABNORMAL LOW (ref 8.9–10.3)
Chloride: 107 mmol/L (ref 98–111)
Creatinine, Ser: 0.67 mg/dL (ref 0.44–1.00)
GFR, Estimated: >60 mL/min (ref >60)
Glucose, Bld: 107 mg/dL — ABNORMAL HIGH (ref 70–99)
Potassium: 3.7 mmol/L (ref 3.5–5.1)
Sodium: 138 mmol/L (ref 135–145)

## 2021-12-15 LAB — HEPARIN LEVEL (UNFRACTIONATED)
Heparin Unfractionated: 0.27 IU/mL — ABNORMAL LOW (ref 0.30–0.70)
Heparin Unfractionated: 0.27 IU/mL — ABNORMAL LOW (ref 0.30–0.70)
Heparin Unfractionated: 0.45 IU/mL (ref 0.30–0.70)

## 2021-12-15 MED ORDER — ASPIRIN 81 MG PO CHEW
81.0000 mg | CHEWABLE_TABLET | Freq: Every day | ORAL | Status: DC
  Administered 2021-12-15 – 2021-12-17 (×2): 81 mg via ORAL
  Filled 2021-12-15 (×2): qty 1

## 2021-12-15 MED ORDER — TECHNETIUM TC 99M TETROFOSMIN IV KIT
9.9400 | PACK | Freq: Once | INTRAVENOUS | Status: AC | PRN
  Administered 2021-12-15: 9.94 via INTRAVENOUS

## 2021-12-15 MED ORDER — TECHNETIUM TC 99M TETROFOSMIN IV KIT: 30.0000 | PACK | Freq: Once | INTRAVENOUS | Status: DC | PRN

## 2021-12-15 MED ORDER — HEPARIN BOLUS VIA INFUSION
1200.0000 [IU] | Freq: Once | INTRAVENOUS | Status: AC
  Administered 2021-12-15: 1200 [IU] via INTRAVENOUS
  Filled 2021-12-15: qty 1200

## 2021-12-15 MED ORDER — TECHNETIUM TC 99M TETROFOSMIN IV KIT
30.0000 | PACK | Freq: Once | INTRAVENOUS | Status: AC | PRN
  Administered 2021-12-15: 28.34 via INTRAVENOUS

## 2021-12-15 MED ORDER — METOPROLOL SUCCINATE ER 25 MG PO TB24
12.5000 mg | ORAL_TABLET | Freq: Every day | ORAL | Status: DC
  Administered 2021-12-15: 12.5 mg via ORAL
  Filled 2021-12-15 (×2): qty 0.5

## 2021-12-15 MED ORDER — ALBUTEROL SULFATE (2.5 MG/3ML) 0.083% IN NEBU: 10.0000 mg | INHALATION_SOLUTION | Freq: Once | RESPIRATORY_TRACT | Status: DC

## 2021-12-15 MED ORDER — ATORVASTATIN CALCIUM 20 MG PO TABS
40.0000 mg | ORAL_TABLET | Freq: Every day | ORAL | Status: DC
  Administered 2021-12-15 – 2021-12-17 (×2): 40 mg via ORAL
  Filled 2021-12-15 (×2): qty 2

## 2021-12-15 MED ORDER — PATIROMER SORBITEX CALCIUM 8.4 G PO PACK
8.4000 g | PACK | Freq: Every day | ORAL | Status: DC
  Filled 2021-12-15: qty 1

## 2021-12-15 MED ORDER — REGADENOSON 0.4 MG/5ML IV SOLN: 0.4000 mg | Freq: Once | INTRAVENOUS | Status: DC

## 2021-12-15 MED ORDER — NICOTINE 21 MG/24HR TD PT24
21.0000 mg | MEDICATED_PATCH | Freq: Every day | TRANSDERMAL | Status: DC
  Administered 2021-12-15 – 2021-12-17 (×2): 21 mg via TRANSDERMAL
  Filled 2021-12-15 (×3): qty 1

## 2021-12-15 NOTE — Progress Notes (Signed)
[facility] Cardiology    SUBJECTIVE: Patient states he feels reasonably well slept well last night no further chest pain no shortness of breath   Vitals:   12/15/21 0400 12/15/21 0500 12/15/21 0539 12/15/21 0600  BP: 124/86 121/77  (!) 125/102  Pulse: 92 98 100 96  Resp: '20  15 15  '$ Temp:   98.2 F (36.8 C)   TempSrc:   Oral   SpO2: 95% 92% 97% 98%  Weight:      Height:         Intake/Output Summary (Last 24 hours) at 12/15/2021 4709 Last data filed at 12/15/2021 0300 Gross per 24 hour  Intake 152.02 ml  Output --  Net 152.02 ml      PHYSICAL EXAM  General: Well developed, well nourished, in no acute distress HEENT:  Normocephalic and atramatic Neck:  No JVD.  Lungs: Clear bilaterally to auscultation and percussion. Heart: HRRR . Normal S1 and S2 without gallops or murmurs.  Abdomen: Bowel sounds are positive, abdomen soft and non-tender  Msk:  Back normal, normal gait. Normal strength and tone for age. Extremities: No clubbing, cyanosis or edema.   Neuro: Alert and oriented X 3. Psych:  Good affect, responds appropriately   LABS: Basic Metabolic Panel: Recent Labs    12/14/21 1454 12/15/21 0140  NA 140 138  K 3.7 3.7  CL 106 107  CO2 27 24  GLUCOSE 140* 107*  BUN 17 21*  CREATININE 0.85 0.67  CALCIUM 9.3 8.7*   Liver Function Tests: Recent Labs    12/14/21 1454  AST 27  ALT 20  ALKPHOS 92  BILITOT 0.4  PROT 7.3  ALBUMIN 4.3   No results for input(s): "LIPASE", "AMYLASE" in the last 72 hours. CBC: Recent Labs    12/14/21 1454 12/15/21 0140  WBC 10.2 9.5  NEUTROABS 6.8  --   HGB 15.5* 13.7  HCT 47.4* 42.0  MCV 96.1 95.5  PLT 319 258   Cardiac Enzymes: No results for input(s): "CKTOTAL", "CKMB", "CKMBINDEX", "TROPONINI" in the last 72 hours. BNP: Invalid input(s): "POCBNP" D-Dimer: No results for input(s): "DDIMER" in the last 72 hours. Hemoglobin A1C: No results for input(s): "HGBA1C" in the last 72 hours. Fasting Lipid Panel: No  results for input(s): "CHOL", "HDL", "LDLCALC", "TRIG", "CHOLHDL", "LDLDIRECT" in the last 72 hours. Thyroid Function Tests: No results for input(s): "TSH", "T4TOTAL", "T3FREE", "THYROIDAB" in the last 72 hours.  Invalid input(s): "FREET3" Anemia Panel: No results for input(s): "VITAMINB12", "FOLATE", "FERRITIN", "TIBC", "IRON", "RETICCTPCT" in the last 72 hours.  DG Chest 2 View  Result Date: 12/14/2021 CLINICAL DATA:  Chest pain. EXAM: CHEST - 2 VIEW COMPARISON:  December 13, 2021. FINDINGS: The heart size and mediastinal contours are within normal limits. Both lungs are clear. The visualized skeletal structures are unremarkable. IMPRESSION: No active cardiopulmonary disease. Electronically Signed   By: Marijo Conception M.D.   On: 12/14/2021 15:37     Echo pending  TELEMETRY: Normal sinus rhythm rate of around 70:  ASSESSMENT AND PLAN:  Principal Problem:   NSTEMI (non-ST elevated myocardial infarction) ([place]) Active Problems:   Cigarette smoker   Hemorrhoids, internal   Anxiety and depression   Gastroesophageal reflux disease   Encounter for smoking cessation counseling   Alcohol use    Plan Continue telemetry monitoring and EKGs troponins are somewhat flat doubtful for non-STEMI probably demand ischemia Recommend functional study instead of cath proceed with exercise Myoview today Advised patient refrain from tobacco abuse Consider treatment  refrain from alcohol consumption Recommend modest weight loss exercise portion control PPI therapy for possible reflux symptoms   Yolonda Kida, MD 12/15/2021 6:42 AM

## 2021-12-15 NOTE — Consult Note (Signed)
[place] for heparin drip Indication: chest pain/ACS  Allergies  Allergen Reactions   2,4-D Dimethylamine Shortness Of Breath   Sulfa Antibiotics Itching and Shortness Of Breath   Azithromycin Other (See Comments)    constipation   Penicillin G Other (See Comments)    Other reaction(s): Unknown Has patient had a PCN reaction causing immediate rash, facial/tongue/throat swelling, SOB or lightheadedness with hypotension:  pt not sure Has patient had a PCN reaction causing severe rash involving mucus membranes or skin necrosis:  pt not sure Has patient had a PCN reaction that required hospitalization  pt not sure Has patient had a PCN reaction occurring within the last 10 years: pt not sure If all    Omnicef [Cefdinir] Itching and Palpitations     Patient Measurements: Height: '5\' 8"'$  (172.7 cm) Weight: 80.3 kg (177 lb) IBW/kg (Calculated) : 63.9 Heparin Dosing Weight: 80 kg  Vital Signs: Temp: 98.2 F (36.8 C) (12/12 0539) Temp Source: Oral (12/12 0539) BP: 125/102 (12/12 0600) Pulse Rate: 96 (12/12 0600)  Labs: Recent Labs    12/13/21 0511 12/14/21 1205 12/14/21 1454 12/14/21 1611 12/14/21 1619 12/15/21 0030 12/15/21 0140 12/15/21 0834  HGB 14.3  --  15.5*  --   --   --  13.7  --   HCT 44.1  --  47.4*  --   --   --  42.0  --   PLT 337  --  319  --   --   --  258  --   APTT  --   --   --  27  --   --   --   --   LABPROT  --   --   --  12.4  --   --   --   --   INR  --   --   --  0.9  --   --   --   --   HEPARINUNFRC  --   --   --   --   --  0.27*  --  0.45  CREATININE 0.84  --  0.85  --   --   --  0.67  --   TROPONINIHS 18*   < > 274*  --  276* 178* 184*  --    < > = values in this interval not displayed.     Estimated Creatinine Clearance: 87.4 mL/min (by C-G formula based on SCr of 0.67 mg/dL).   Medical History: Past Medical History:  Diagnosis Date   Anxiety and depression    Cyst of left kidney    GERD  (gastroesophageal reflux disease)    Gross hematuria    Internal hemorrhoids    Kidney stones    Low back pain    Lower abdominal pain    Monilial vaginitis    Nasal congestion    PONV (postoperative nausea and vomiting)    Shortness of breath dyspnea    Vaginitis     Medications:  No anticoagulants per pharmacist review  Assessment: 56 yo female presented from PCP office to ED with chest pain and elevated troponin.  Goal of Therapy:  Heparin level 0.3-0.7 units/ml Monitor platelets by anticoagulation protocol: Yes   12/12 0030 HL 0.27, subtherapeutic 12/12 0834 HL 0.45, therapeutic x 1  Plan: Continue heparin infusion at 1100 units/hr Recheck HL in 6 hrs CBC daily while on heparin   Lorin Picket, PharmD 12/15/2021 10:03 AM

## 2021-12-15 NOTE — Consult Note (Signed)
[place] for heparin drip Indication: chest pain/ACS  Allergies  Allergen Reactions   2,4-D Dimethylamine Shortness Of Breath   Sulfa Antibiotics Itching and Shortness Of Breath   Azithromycin Other (See Comments)    constipation   Penicillin G Other (See Comments)    Other reaction(s): Unknown Has patient had a PCN reaction causing immediate rash, facial/tongue/throat swelling, SOB or lightheadedness with hypotension:  pt not sure Has patient had a PCN reaction causing severe rash involving mucus membranes or skin necrosis:  pt not sure Has patient had a PCN reaction that required hospitalization  pt not sure Has patient had a PCN reaction occurring within the last 10 years: pt not sure If all    Omnicef [Cefdinir] Itching and Palpitations     Patient Measurements: Height: '5\' 8"'$  (172.7 cm) Weight: 80.3 kg (177 lb) IBW/kg (Calculated) : 63.9 Heparin Dosing Weight: 80 kg  Vital Signs: Temp: 98.3 F (36.8 C) (12/12 1530) Temp Source: Oral (12/12 1530) BP: 125/105 (12/12 1530) Pulse Rate: 84 (12/12 1540)  Labs: Recent Labs    12/13/21 0511 12/14/21 1205 12/14/21 1454 12/14/21 1611 12/14/21 1619 12/15/21 0030 12/15/21 0140 12/15/21 0834 12/15/21 1727  HGB 14.3  --  15.5*  --   --   --  13.7  --   --   HCT 44.1  --  47.4*  --   --   --  42.0  --   --   PLT 337  --  319  --   --   --  258  --   --   APTT  --   --   --  27  --   --   --   --   --   LABPROT  --   --   --  12.4  --   --   --   --   --   INR  --   --   --  0.9  --   --   --   --   --   HEPARINUNFRC  --   --   --   --   --  0.27*  --  0.45 0.27*  CREATININE 0.84  --  0.85  --   --   --  0.67  --   --   TROPONINIHS 18*   < > 274*  --  276* 178* 184*  --   --    < > = values in this interval not displayed.     Estimated Creatinine Clearance: 87.4 mL/min (by C-G formula based on SCr of 0.67 mg/dL).   Medical History: Past Medical History:  Diagnosis Date    Anxiety and depression    Cyst of left kidney    GERD (gastroesophageal reflux disease)    Gross hematuria    Internal hemorrhoids    Kidney stones    Low back pain    Lower abdominal pain    Monilial vaginitis    Nasal congestion    PONV (postoperative nausea and vomiting)    Shortness of breath dyspnea    Vaginitis     Medications:  No anticoagulants per pharmacist review  Assessment: 56 yo female presented from PCP office to ED with chest pain and elevated troponin.  Goal of Therapy:  Heparin level 0.3-0.7 units/ml Monitor platelets by anticoagulation protocol: Yes   12/12 0030 HL 0.27, subtherapeutic 12/12 0834 HL 0.45, therapeutic x 1 12/12 1727 HL 0.27, subtherapeutic  Plan: Bolus heparin 1200 units x 1 Increase heparin infusion to 1250 units/hr Recheck HL in 6 hrs after rate change CBC daily while on heparin   Lorin Picket, PharmD 12/15/2021 6:02 PM

## 2021-12-15 NOTE — Progress Notes (Signed)
Dr. Clayborn Bigness reviewed this patient's treadmill Myoview which revealed a fixed apical defect without reversible ischemia, consistent with a prior infarct.  Current presentation/trop elevation is most consistent with demand ischemia in the setting of suspected underlying CAD.  She exercised for 4 minutes without any recurrent angina, test was stopped due to shortness of breath and fatigue.  Discussed with the patient the option to stay overnight for [facility] tomorrow at 12 PM vs follow-up on an outpatient basis and outpatient LHC.  She is agreeable to stay overnight.  I discussed the risk, benefits, alternatives, and potential outcomes of left heart catheterization with the patient and she is agreeable to proceed.  Written consent will be obtained.  Will make n.p.o. after midnight tonight.  -Continue aspirin 81 mg daily -Continue heparin until LHC -Echocardiogram complete -Start metoprolol XL 12.5 mg once daily -Start atorvastatin '40mg'$  daily -Goal to add isosorbide if her blood pressure allows -Risk factor modification with lipid panel, A1c -Further recommendations following LHC.  -Regardless, will arrange for clinic follow up with Dr. Clayborn Bigness next week.   This patient's plan of care was discussed and created with Dr. Clayborn Bigness and he is in agreement.    Brianna Huff  [facility]  12/15/21, 3:37pm

## 2021-12-15 NOTE — ED Notes (Signed)
Patient in cardiac stress lab. Will update vitals upon return.

## 2021-12-15 NOTE — Progress Notes (Signed)
PROGRESS NOTE    Brianna Huff  YCX:[id number] DOB: [birth year] DOA: 12/14/2021 PCP: Teodora Medici, DO    Brief Narrative:   Presenting referred by PCP for elevated troponin in setting of recent chest pain.    Assessment & Plan:   Principal Problem:   NSTEMI (non-ST elevated myocardial infarction) ([place]) Active Problems:   Cigarette smoker   Hemorrhoids, internal   Anxiety and depression   Gastroesophageal reflux disease   History of kidney stones   Encounter for smoking cessation counseling   Alcohol use  # NSTEMI Troponin peak of 276. Chest pain is resolved. No overt ischemic changes on EKG. Myoview today shows evidence prior infarct. Cardiology has offered patient cath tomorrow or as outpatient and patient elects to pursue while inpatient. - tele - continue heparin - aspirin/statin/BB ordered by cardiology - for [facility] tomorrow  # Alcohol use Reports a few drinks a day, no hx withdrawal - monitor  # Tobacco abuse Declines patch for now  # GERD Recent normal EGD - cont home pantoprazole   DVT prophylaxis: IV heparin Code Status: full Family Communication: none @ bedside  Level of care: Telemetry Cardiac Status is: Inpatient     Consultants:  cardiology  Procedures: LHC pending  Antimicrobials:  none    Subjective: No chest pain or dyspnea, feeling well  Objective: Vitals:   12/15/21 0539 12/15/21 0600 12/15/21 0800 12/15/21 1000  BP:  (!) 125/102 126/89 109/76  Pulse: 100 96 (!) 104 89  Resp: '15 15  18  '$ Temp: 98.2 F (36.8 C)  98.3 F (36.8 C)   TempSrc: Oral  Oral   SpO2: 97% 98% 97% 95%  Weight:      Height:        Intake/Output Summary (Last 24 hours) at 12/15/2021 1539 Last data filed at 12/15/2021 0700 Gross per 24 hour  Intake 196.04 ml  Output --  Net 196.04 ml   Filed Weights   12/14/21 1450  Weight: 80.3 kg    Examination:  General exam: Appears calm and comfortable  Respiratory system: Clear to  auscultation. Respiratory effort normal. Cardiovascular system: S1 & S2 heard, RRR. No JVD, murmurs, rubs, gallops or clicks. No pedal edema. Gastrointestinal system: Abdomen is nondistended, soft and nontender. No organomegaly or masses felt. Normal bowel sounds heard. Central nervous system: Alert and oriented. No focal neurological deficits. Extremities: Symmetric 5 x 5 power. Skin: No rashes, lesions or ulcers Psychiatry: Judgement and insight appear normal. Mood & affect appropriate.     Data Reviewed: I have personally reviewed following labs and imaging studies  CBC: Recent Labs  Lab 12/13/21 0511 12/14/21 1454 12/15/21 0140  WBC 10.2 10.2 9.5  NEUTROABS  --  6.8  --   HGB 14.3 15.5* 13.7  HCT 44.1 47.4* 42.0  MCV 95.0 96.1 95.5  PLT 337 319 497   Basic Metabolic Panel: Recent Labs  Lab 12/13/21 0511 12/14/21 1454 12/15/21 0140  NA 139 140 138  K 3.7 3.7 3.7  CL 104 106 107  CO2 '27 27 24  '$ GLUCOSE 142* 140* 107*  BUN 26* 17 21*  CREATININE 0.84 0.85 0.67  CALCIUM 9.1 9.3 8.7*   GFR: Estimated Creatinine Clearance: 87.4 mL/min (by C-G formula based on SCr of 0.67 mg/dL). Liver Function Tests: Recent Labs  Lab 12/14/21 1454  AST 27  ALT 20  ALKPHOS 92  BILITOT 0.4  PROT 7.3  ALBUMIN 4.3   No results for input(s): "LIPASE", "AMYLASE" in the last 168  hours. No results for input(s): "AMMONIA" in the last 168 hours. Coagulation Profile: Recent Labs  Lab 12/14/21 1611  INR 0.9   Cardiac Enzymes: No results for input(s): "CKTOTAL", "CKMB", "CKMBINDEX", "TROPONINI" in the last 168 hours. BNP (last 3 results) No results for input(s): "PROBNP" in the last 8760 hours. HbA1C: No results for input(s): "HGBA1C" in the last 72 hours. CBG: No results for input(s): "GLUCAP" in the last 168 hours. Lipid Profile: No results for input(s): "CHOL", "HDL", "LDLCALC", "TRIG", "CHOLHDL", "LDLDIRECT" in the last 72 hours. Thyroid Function Tests: No results for  input(s): "TSH", "T4TOTAL", "FREET4", "T3FREE", "THYROIDAB" in the last 72 hours. Anemia Panel: No results for input(s): "VITAMINB12", "FOLATE", "FERRITIN", "TIBC", "IRON", "RETICCTPCT" in the last 72 hours. Urine analysis:    Component Value Date/Time   COLORURINE YELLOW 01/31/2021 1632   APPEARANCEUR CLOUDY (A) 01/31/2021 1632   APPEARANCEUR Clear 12/25/2014 1521   LABSPEC 1.020 01/31/2021 1632   PHURINE 8.5 (H) 01/31/2021 1632   GLUCOSEU NEGATIVE 01/31/2021 1632   HGBUR MODERATE (A) 01/31/2021 1632   BILIRUBINUR NEGATIVE 01/31/2021 1632   BILIRUBINUR Negative 12/25/2014 1521   KETONESUR 40 (A) 01/31/2021 1632   PROTEINUR 30 (A) 01/31/2021 1632   NITRITE NEGATIVE 01/31/2021 1632   LEUKOCYTESUR NEGATIVE 01/31/2021 1632   Sepsis Labs: '@LABRCNTIP'$ (procalcitonin:4,lacticidven:4)  )No results found for this or any previous visit (from the past 240 hour(s)).       Radiology Studies: DG Chest 2 View  Result Date: 12/14/2021 CLINICAL DATA:  Chest pain. EXAM: CHEST - 2 VIEW COMPARISON:  December 13, 2021. FINDINGS: The heart size and mediastinal contours are within normal limits. Both lungs are clear. The visualized skeletal structures are unremarkable. IMPRESSION: No active cardiopulmonary disease. Electronically Signed   By: Marijo Conception M.D.   On: 12/14/2021 15:37        Scheduled Meds:  aspirin  81 mg Oral Daily   atorvastatin  40 mg Oral Daily   azelastine  1 spray Each Nare BID   metoprolol succinate  12.5 mg Oral Daily   nicotine  21 mg Transdermal Daily   pantoprazole  40 mg Oral BID   regadenoson  0.4 mg Intravenous Once   sodium chloride flush  3 mL Intravenous Q12H   Continuous Infusions:  heparin 1,100 Units/hr (12/15/21 1216)     LOS: 1 day     Desma Maxim, MD [facility]   If 7PM-7AM, please contact night-coverage [email or web address] Password [facility] 12/15/2021, 3:39 PM

## 2021-12-15 NOTE — Consult Note (Signed)
[place] for heparin drip Indication: chest pain/ACS  Allergies  Allergen Reactions   2,4-D Dimethylamine Shortness Of Breath   Sulfa Antibiotics Itching and Shortness Of Breath   Azithromycin Other (See Comments)    constipation   Penicillin G Other (See Comments)    Other reaction(s): Unknown Has patient had a PCN reaction causing immediate rash, facial/tongue/throat swelling, SOB or lightheadedness with hypotension:  pt not sure Has patient had a PCN reaction causing severe rash involving mucus membranes or skin necrosis:  pt not sure Has patient had a PCN reaction that required hospitalization  pt not sure Has patient had a PCN reaction occurring within the last 10 years: pt not sure If all    Omnicef [Cefdinir] Itching and Palpitations     Patient Measurements: Height: '5\' 8"'$  (172.7 cm) Weight: 80.3 kg (177 lb) IBW/kg (Calculated) : 63.9 Heparin Dosing Weight: 80 kg  Vital Signs: Temp: 98 F (36.7 C) (12/12 0019) Temp Source: Oral (12/12 0019) BP: 119/100 (12/12 0100) Pulse Rate: 105 (12/12 0100)  Labs: Recent Labs    12/13/21 0511 12/14/21 1205 12/14/21 1454 12/14/21 1611 12/14/21 1619 12/15/21 0030  HGB 14.3  --  15.5*  --   --   --   HCT 44.1  --  47.4*  --   --   --   PLT 337  --  319  --   --   --   APTT  --   --   --  27  --   --   LABPROT  --   --   --  12.4  --   --   INR  --   --   --  0.9  --   --   HEPARINUNFRC  --   --   --   --   --  0.27*  CREATININE 0.84  --  0.85  --   --   --   TROPONINIHS 18*   < > 274*  --  276* 178*   < > = values in this interval not displayed.     Estimated Creatinine Clearance: 82.3 mL/min (by C-G formula based on SCr of 0.85 mg/dL).   Medical History: Past Medical History:  Diagnosis Date   Anxiety and depression    Cyst of left kidney    GERD (gastroesophageal reflux disease)    Gross hematuria    Internal hemorrhoids    Kidney stones    Low back pain    Lower  abdominal pain    Monilial vaginitis    Nasal congestion    PONV (postoperative nausea and vomiting)    Shortness of breath dyspnea    Vaginitis     Medications:  No anticoagulants per pharmacist review  Assessment: 56 yo female presented from PCP office to ED with chest pain and elevated troponin.  Goal of Therapy:  Heparin level 0.3-0.7 units/ml Monitor platelets by anticoagulation protocol: Yes   12/12 0030 HL 0.27, subtherapeutic  Plan: Bolus 1200 units x 1 Increase heparin infusion to 1100 units/hr Recheck HL in 6 hr after rate change CBC daily while on heparin   Renda Rolls, PharmD, [facility] 12/15/2021 1:45 AM

## 2021-12-16 ENCOUNTER — Inpatient Hospital Stay
Admit: 2021-12-16 | Discharge: 2021-12-16 | Disposition: A | Discharge status: Home / Self Care | Payer: Managed Care, Other (non HMO) | Attending: Cardiology

## 2021-12-16 ENCOUNTER — Encounter
Admission: EM | Disposition: A | Discharge status: Home / Self Care | Payer: Self-pay | Source: Home / Self Care | Attending: Internal Medicine

## 2021-12-16 DIAGNOSIS — K219 Gastro-esophageal reflux disease without esophagitis: Secondary | ICD-10-CM | POA: Diagnosis not present

## 2021-12-16 DIAGNOSIS — I214 Non-ST elevation (NSTEMI) myocardial infarction: Secondary | ICD-10-CM | POA: Diagnosis not present

## 2021-12-16 DIAGNOSIS — Z720 Tobacco use: Secondary | ICD-10-CM | POA: Diagnosis not present

## 2021-12-16 HISTORY — PX: LEFT HEART CATH AND CORONARY ANGIOGRAPHY: CATH118249

## 2021-12-16 LAB — CBC
HCT: 42.1 % (ref 36.0–46.0)
Hemoglobin: 13.8 g/dL (ref 12.0–15.0)
MCH: 31.7 pg (ref 26.0–34.0)
MCHC: 32.8 g/dL (ref 30.0–36.0)
MCV: 96.6 fL (ref 80.0–100.0)
Platelets: 233 10*3/uL (ref 150–400)
RBC: 4.36 MIL/uL (ref 3.87–5.11)
RDW: 13.5 % (ref 11.5–15.5)
WBC: 5.9 10*3/uL (ref 4.0–10.5)
nRBC: 0 % (ref 0.0–0.2)

## 2021-12-16 LAB — LIPID PANEL
Cholesterol: 227 mg/dL — ABNORMAL HIGH (ref 0–200)
HDL: 47 mg/dL (ref >40)
LDL Cholesterol: 125 mg/dL — ABNORMAL HIGH (ref 0–99)
Total CHOL/HDL Ratio: 4.8 RATIO
Triglycerides: 273 mg/dL — ABNORMAL HIGH (ref <150)
VLDL: 55 mg/dL — ABNORMAL HIGH (ref 0–40)

## 2021-12-16 LAB — BASIC METABOLIC PANEL
Anion gap: 3 — ABNORMAL LOW (ref 5–15)
BUN: 18 mg/dL (ref 6–20)
CO2: 25 mmol/L (ref 22–32)
Calcium: 9 mg/dL (ref 8.9–10.3)
Chloride: 112 mmol/L — ABNORMAL HIGH (ref 98–111)
Creatinine, Ser: 0.71 mg/dL (ref 0.44–1.00)
GFR, Estimated: >60 mL/min (ref >60)
Glucose, Bld: 107 mg/dL — ABNORMAL HIGH (ref 70–99)
Potassium: 3.8 mmol/L (ref 3.5–5.1)
Sodium: 140 mmol/L (ref 135–145)

## 2021-12-16 LAB — HEMOGLOBIN A1C
Hgb A1c MFr Bld: 5.7 % — ABNORMAL HIGH (ref 4.8–5.6)
Mean Plasma Glucose: 117 mg/dL

## 2021-12-16 LAB — HEPARIN LEVEL (UNFRACTIONATED): Heparin Unfractionated: 0.49 IU/mL (ref 0.30–0.70)

## 2021-12-16 SURGERY — LEFT HEART CATH AND CORONARY ANGIOGRAPHY
Anesthesia: Moderate Sedation

## 2021-12-16 MED ORDER — SODIUM CHLORIDE 0.9% FLUSH
3.0000 mL | Freq: Two times a day (BID) | INTRAVENOUS | Status: DC
  Administered 2021-12-16 – 2021-12-17 (×2): 3 mL via INTRAVENOUS

## 2021-12-16 MED ORDER — ACETAMINOPHEN 325 MG PO TABS
ORAL_TABLET | ORAL | Status: AC
  Filled 2021-12-16: qty 2

## 2021-12-16 MED ORDER — LIDOCAINE HCL (PF) 1 % IJ SOLN
INTRAMUSCULAR | Status: DC | PRN
  Administered 2021-12-16: 20 mL
  Administered 2021-12-16: 2 mL

## 2021-12-16 MED ORDER — FENTANYL CITRATE (PF) 100 MCG/2ML IJ SOLN
INTRAMUSCULAR | Status: AC
  Filled 2021-12-16: qty 2

## 2021-12-16 MED ORDER — SODIUM CHLORIDE 0.9 % WEIGHT BASED INFUSION
1.0000 mL/kg/h | INTRAVENOUS | Status: DC
  Administered 2021-12-16: 1 mL/kg/h via INTRAVENOUS

## 2021-12-16 MED ORDER — HEPARIN (PORCINE) IN NACL 1000-0.9 UT/500ML-% IV SOLN
INTRAVENOUS | Status: AC
  Filled 2021-12-16: qty 1000

## 2021-12-16 MED ORDER — LABETALOL HCL 5 MG/ML IV SOLN: 10.0000 mg | INTRAVENOUS | Status: AC | PRN

## 2021-12-16 MED ORDER — VERAPAMIL HCL 2.5 MG/ML IV SOLN
INTRAVENOUS | Status: DC | PRN
  Administered 2021-12-16 (×2): 2.5 mg via INTRA_ARTERIAL

## 2021-12-16 MED ORDER — VERAPAMIL HCL 2.5 MG/ML IV SOLN
INTRAVENOUS | Status: AC
  Filled 2021-12-16: qty 2

## 2021-12-16 MED ORDER — IOHEXOL 300 MG/ML  SOLN
INTRAMUSCULAR | Status: DC | PRN
  Administered 2021-12-16: 83 mL

## 2021-12-16 MED ORDER — ASPIRIN 81 MG PO CHEW
CHEWABLE_TABLET | ORAL | Status: AC
  Filled 2021-12-16: qty 1

## 2021-12-16 MED ORDER — ACETAMINOPHEN 325 MG PO TABS: 650.0000 mg | ORAL_TABLET | ORAL | Status: DC | PRN

## 2021-12-16 MED ORDER — HEPARIN SODIUM (PORCINE) 1000 UNIT/ML IJ SOLN
INTRAMUSCULAR | Status: DC | PRN
  Administered 2021-12-16: 4500 [IU] via INTRAVENOUS

## 2021-12-16 MED ORDER — SODIUM CHLORIDE 0.9 % IV SOLN: 250.0000 mL | INTRAVENOUS | Status: DC | PRN

## 2021-12-16 MED ORDER — FENTANYL CITRATE (PF) 100 MCG/2ML IJ SOLN: 50.0000 ug | Freq: Once | INTRAMUSCULAR | Status: DC

## 2021-12-16 MED ORDER — MIDAZOLAM HCL 2 MG/2ML IJ SOLN
INTRAMUSCULAR | Status: AC
  Filled 2021-12-16: qty 2

## 2021-12-16 MED ORDER — ASPIRIN 81 MG PO CHEW
81.0000 mg | CHEWABLE_TABLET | ORAL | Status: AC
  Administered 2021-12-16: 81 mg via ORAL

## 2021-12-16 MED ORDER — HEPARIN (PORCINE) IN NACL 1000-0.9 UT/500ML-% IV SOLN
INTRAVENOUS | Status: DC | PRN
  Administered 2021-12-16 (×2): 500 mL

## 2021-12-16 MED ORDER — HEPARIN SODIUM (PORCINE) 1000 UNIT/ML IJ SOLN
INTRAMUSCULAR | Status: AC
  Filled 2021-12-16: qty 10

## 2021-12-16 MED ORDER — DILTIAZEM HCL 30 MG PO TABS
30.0000 mg | ORAL_TABLET | Freq: Two times a day (BID) | ORAL | Status: DC
  Administered 2021-12-17: 30 mg via ORAL
  Filled 2021-12-16 (×2): qty 1

## 2021-12-16 MED ORDER — HYDRALAZINE HCL 20 MG/ML IJ SOLN: 10.0000 mg | INTRAMUSCULAR | Status: AC | PRN

## 2021-12-16 MED ORDER — LIDOCAINE HCL 1 % IJ SOLN
INTRAMUSCULAR | Status: AC
  Filled 2021-12-16: qty 20

## 2021-12-16 MED ORDER — ONDANSETRON HCL 4 MG/2ML IJ SOLN: 4.0000 mg | Freq: Four times a day (QID) | INTRAMUSCULAR | Status: DC | PRN

## 2021-12-16 MED ORDER — SODIUM CHLORIDE 0.9% FLUSH: 3.0000 mL | INTRAVENOUS | Status: DC | PRN

## 2021-12-16 MED ORDER — PERFLUTREN LIPID MICROSPHERE
1.0000 mL | INTRAVENOUS | Status: AC | PRN
  Administered 2021-12-16: 2 mL via INTRAVENOUS

## 2021-12-16 MED ORDER — ISOSORBIDE MONONITRATE ER 30 MG PO TB24: 15.0000 mg | ORAL_TABLET | Freq: Every day | ORAL | Status: DC

## 2021-12-16 MED ORDER — SODIUM CHLORIDE 0.9 % WEIGHT BASED INFUSION: 1.0000 mL/kg/h | INTRAVENOUS | Status: AC

## 2021-12-16 MED ORDER — MIDAZOLAM HCL 2 MG/2ML IJ SOLN
INTRAMUSCULAR | Status: DC | PRN
  Administered 2021-12-16: 1 mg via INTRAVENOUS

## 2021-12-16 MED ORDER — FENTANYL CITRATE (PF) 100 MCG/2ML IJ SOLN
INTRAMUSCULAR | Status: DC | PRN
  Administered 2021-12-16: 25 ug via INTRAVENOUS

## 2021-12-16 MED ORDER — SODIUM CHLORIDE 0.9 % WEIGHT BASED INFUSION
3.0000 mL/kg/h | INTRAVENOUS | Status: DC
  Administered 2021-12-16: 3 mL/kg/h via INTRAVENOUS

## 2021-12-16 SURGICAL SUPPLY — 17 items
CATH 5FR JL3.5 JR4 ANG PIG MP (CATHETERS) IMPLANT
CATH INFINITI 5FR JL4 (CATHETERS) IMPLANT
COVER EZ STRL 42X30 (DRAPES) IMPLANT
DEVICE CLOSURE MYNXGRIP 6/7F (Vascular Products) IMPLANT
DEVICE RAD TR BAND REGULAR (VASCULAR PRODUCTS) IMPLANT
DRAPE BRACHIAL (DRAPES) IMPLANT
GLIDESHEATH SLEND SS 6F .021 (SHEATH) IMPLANT
GUIDEWIRE INQWIRE 1.5J.035X260 (WIRE) IMPLANT
INQWIRE 1.5J .035X260CM (WIRE) ×1
NDL PERC 18GX7CM (NEEDLE) IMPLANT
NEEDLE PERC 18GX7CM (NEEDLE) ×1 IMPLANT
PACK CARDIAC CATH (CUSTOM PROCEDURE TRAY) ×1 IMPLANT
PROTECTION STATION PRESSURIZED (MISCELLANEOUS) ×1
SET ATX SIMPLICITY (MISCELLANEOUS) IMPLANT
SHEATH AVANTI 6FR X 11CM (SHEATH) IMPLANT
STATION PROTECTION PRESSURIZED (MISCELLANEOUS) IMPLANT
WIRE GUIDERIGHT .035X150 (WIRE) IMPLANT

## 2021-12-16 NOTE — ED Notes (Signed)
Pt independently ambulatory to and from restroom without difficulty

## 2021-12-16 NOTE — Progress Notes (Signed)
PT ambulatory to restroom , groin stable post ambulation

## 2021-12-16 NOTE — Progress Notes (Signed)
*  PRELIMINARY RESULTS* Echocardiogram 2D Echocardiogram has been performed.  Brianna Huff 12/16/2021, 8:02 AM

## 2021-12-16 NOTE — Progress Notes (Signed)
PROGRESS NOTE    Brianna Huff  LPF:[id number] DOB: [birth year] DOA: 12/14/2021 PCP: Teodora Medici, DO   Assessment & Plan:   Principal Problem:   NSTEMI (non-ST elevated myocardial infarction) [facility]) Active Problems:   Cigarette smoker   Hemorrhoids, internal   Anxiety and depression   Gastroesophageal reflux disease   History of kidney stones   Encounter for smoking cessation counseling   Alcohol use  NSTEMI: w/ peak troponin 276. Cardiac stress test shows evidence prior infarct. Cardiac cath today as per cardio. Continue on IV heparin. Continue on tele    Alcohol use: reports a few drinks a day, no hx withdrawal. Will monitor    Tobacco abuse: pt refuses nicotine patch.    GERD: continue on PPI         DVT prophylaxis: IV heparin  Code Status: full  Family Communication:  Disposition Plan: likely d/c back home    Status is: Inpatient Remains inpatient appropriate because: cardiac cath today     Level of care: Telemetry Cardiac Consultants:  Cardio   Procedures:    Antimicrobials:    Subjective: Pt c/o fatigue.   Objective: Vitals:   12/16/21 0020 12/16/21 0409 12/16/21 0630 12/16/21 0850  BP: 108/61 117/63 100/64   Pulse: 77 75 84   Resp: '18 18 16   '$ Temp: 98.5 F (36.9 C) 98.2 F (36.8 C)  98 F (36.7 C)  TempSrc: Oral Oral  Oral  SpO2: 97% 99% 94%   Weight:      Height:       No intake or output data in the 24 hours ending 12/16/21 0906 Filed Weights   12/14/21 1450  Weight: 80.3 kg    Examination:  General exam: Appears calm and comfortable  Respiratory system: Clear to auscultation. Respiratory effort normal. Cardiovascular system: S1 & S2+. No rubs, gallops or clicks.  Gastrointestinal system: Abdomen is nondistended, soft and nontender.  Normal bowel sounds heard. Central nervous system: Alert and oriented. Moves all extremities  Psychiatry: Judgement and insight appear normal. Flat mood and affect      Data  Reviewed: I have personally reviewed following labs and imaging studies  CBC: Recent Labs  Lab 12/13/21 0511 12/14/21 1454 12/15/21 0140 12/16/21 0402  WBC 10.2 10.2 9.5 5.9  NEUTROABS  --  6.8  --   --   HGB 14.3 15.5* 13.7 13.8  HCT 44.1 47.4* 42.0 42.1  MCV 95.0 96.1 95.5 96.6  PLT 337 319 258 532   Basic Metabolic Panel: Recent Labs  Lab 12/13/21 0511 12/14/21 1454 12/15/21 0140 12/16/21 0402  NA 139 140 138 140  K 3.7 3.7 3.7 3.8  CL 104 106 107 112*  CO2 '27 27 24 25  '$ GLUCOSE 142* 140* 107* 107*  BUN 26* 17 21* 18  CREATININE 0.84 0.85 0.67 0.71  CALCIUM 9.1 9.3 8.7* 9.0   GFR: Estimated Creatinine Clearance: 87.4 mL/min (by C-G formula based on SCr of 0.71 mg/dL). Liver Function Tests: Recent Labs  Lab 12/14/21 1454  AST 27  ALT 20  ALKPHOS 92  BILITOT 0.4  PROT 7.3  ALBUMIN 4.3   No results for input(s): "LIPASE", "AMYLASE" in the last 168 hours. No results for input(s): "AMMONIA" in the last 168 hours. Coagulation Profile: Recent Labs  Lab 12/14/21 1611  INR 0.9   Cardiac Enzymes: No results for input(s): "CKTOTAL", "CKMB", "CKMBINDEX", "TROPONINI" in the last 168 hours. BNP (last 3 results) No results for input(s): "PROBNP" in the last 8760  hours. HbA1C: No results for input(s): "HGBA1C" in the last 72 hours. CBG: No results for input(s): "GLUCAP" in the last 168 hours. Lipid Profile: Recent Labs    12/16/21 0402  CHOL 227*  HDL 47  LDLCALC 125*  TRIG 273*  CHOLHDL 4.8   Thyroid Function Tests: No results for input(s): "TSH", "T4TOTAL", "FREET4", "T3FREE", "THYROIDAB" in the last 72 hours. Anemia Panel: No results for input(s): "VITAMINB12", "FOLATE", "FERRITIN", "TIBC", "IRON", "RETICCTPCT" in the last 72 hours. Sepsis Labs: No results for input(s): "PROCALCITON", "LATICACIDVEN" in the last 168 hours.  No results found for this or any previous visit (from the past 240 hour(s)).       Radiology Studies: DG Chest 2  View  Result Date: 12/14/2021 CLINICAL DATA:  Chest pain. EXAM: CHEST - 2 VIEW COMPARISON:  December 13, 2021. FINDINGS: The heart size and mediastinal contours are within normal limits. Both lungs are clear. The visualized skeletal structures are unremarkable. IMPRESSION: No active cardiopulmonary disease. Electronically Signed   By: Marijo Conception M.D.   On: 12/14/2021 15:37        Scheduled Meds:  aspirin  81 mg Oral Daily   atorvastatin  40 mg Oral Daily   azelastine  1 spray Each Nare BID   metoprolol succinate  12.5 mg Oral Daily   nicotine  21 mg Transdermal Daily   pantoprazole  40 mg Oral BID   regadenoson  0.4 mg Intravenous Once   sodium chloride flush  3 mL Intravenous Q12H   Continuous Infusions:  heparin 1,250 Units/hr (12/15/21 1848)     LOS: 2 days    Time spent: 35 mins     Wyvonnia Dusky, MD [facility] Pager [phone]  If 7PM-7AM, please contact night-coverage [email or web address] 12/16/2021, 9:06 AM

## 2021-12-16 NOTE — Consult Note (Signed)
[place] for heparin drip Indication: chest pain/ACS  Allergies  Allergen Reactions   2,4-D Dimethylamine Shortness Of Breath   Sulfa Antibiotics Itching and Shortness Of Breath   Azithromycin Other (See Comments)    constipation   Penicillin G Other (See Comments)    Other reaction(s): Unknown Has patient had a PCN reaction causing immediate rash, facial/tongue/throat swelling, SOB or lightheadedness with hypotension:  pt not sure Has patient had a PCN reaction causing severe rash involving mucus membranes or skin necrosis:  pt not sure Has patient had a PCN reaction that required hospitalization  pt not sure Has patient had a PCN reaction occurring within the last 10 years: pt not sure If all    Omnicef [Cefdinir] Itching and Palpitations     Patient Measurements: Height: '5\' 8"'$  (172.7 cm) Weight: 80.3 kg (177 lb) IBW/kg (Calculated) : 63.9 Heparin Dosing Weight: 80 kg  Vital Signs: Temp: 98.2 F (36.8 C) (12/13 0409) Temp Source: Oral (12/13 0409) BP: 117/63 (12/13 0409) Pulse Rate: 75 (12/13 0409)  Labs: Recent Labs    12/14/21 1454 12/14/21 1454 12/14/21 1611 12/14/21 1619 12/15/21 0030 12/15/21 0140 12/15/21 0834 12/15/21 1727 12/16/21 0402  HGB 15.5*  --   --   --   --  13.7  --   --  13.8  HCT 47.4*  --   --   --   --  42.0  --   --  42.1  PLT 319  --   --   --   --  258  --   --  233  APTT  --   --  27  --   --   --   --   --   --   LABPROT  --   --  12.4  --   --   --   --   --   --   INR  --   --  0.9  --   --   --   --   --   --   HEPARINUNFRC  --    < >  --   --  0.27*  --  0.45 0.27* 0.49  CREATININE 0.85  --   --   --   --  0.67  --   --  0.71  TROPONINIHS 274*  --   --  276* 178* 184*  --   --   --    < > = values in this interval not displayed.     Estimated Creatinine Clearance: 87.4 mL/min (by C-G formula based on SCr of 0.71 mg/dL).   Medical History: Past Medical History:  Diagnosis Date    Anxiety and depression    Cyst of left kidney    GERD (gastroesophageal reflux disease)    Gross hematuria    Internal hemorrhoids    Kidney stones    Low back pain    Lower abdominal pain    Monilial vaginitis    Nasal congestion    PONV (postoperative nausea and vomiting)    Shortness of breath dyspnea    Vaginitis     Medications:  No anticoagulants per pharmacist review  Assessment: 56 yo female presented from PCP office to ED with chest pain and elevated troponin.  Goal of Therapy:  Heparin level 0.3-0.7 units/ml Monitor platelets by anticoagulation protocol: Yes   12/12 0030 HL 0.27, subtherapeutic 12/12 0834 HL 0.45, therapeutic x 1 12/12 1727 HL 0.27, subtherapeutic  12/13 0402 HL 0.49, therapeutic x 1  Plan: Continue heparin infusion at 1250 units/hr Recheck HL in 6 hrs to confirm. CBC daily while on heparin   Renda Rolls, PharmD, [facility] 12/16/2021 5:15 AM

## 2021-12-16 NOTE — Consult Note (Signed)
[facility] Cardiology    SUBJECTIVE: No further chest pain recovering well denies any shortness of breath dyspnea proceed with cardiac cath today   Vitals:   12/15/21 2000 12/15/21 2100 12/16/21 0020 12/16/21 0409  BP: (!) 117/97  108/61 117/63  Pulse: 96 84 77 75  Resp: '17 16 18 18  '$ Temp:   98.5 F (36.9 C) 98.2 F (36.8 C)  TempSrc:   Oral Oral  SpO2: 96% 94% 97% 99%  Weight:      Height:        No intake or output data in the 24 hours ending 12/16/21 0717    PHYSICAL EXAM  General: Well developed, well nourished, in no acute distress HEENT:  Normocephalic and atramatic Neck:  No JVD.  Lungs: Clear bilaterally to auscultation and percussion. Heart: HRRR . Normal S1 and S2 without gallops or murmurs.  Abdomen: Bowel sounds are positive, abdomen soft and non-tender  Msk:  Back normal, normal gait. Normal strength and tone for age. Extremities: No clubbing, cyanosis or edema.   Neuro: Alert and oriented X 3. Psych:  Good affect, responds appropriately   LABS: Basic Metabolic Panel: Recent Labs    12/15/21 0140 12/16/21 0402  NA 138 140  K 3.7 3.8  CL 107 112*  CO2 24 25  GLUCOSE 107* 107*  BUN 21* 18  CREATININE 0.67 0.71  CALCIUM 8.7* 9.0   Liver Function Tests: Recent Labs    12/14/21 1454  AST 27  ALT 20  ALKPHOS 92  BILITOT 0.4  PROT 7.3  ALBUMIN 4.3   No results for input(s): "LIPASE", "AMYLASE" in the last 72 hours. CBC: Recent Labs    12/14/21 1454 12/15/21 0140 12/16/21 0402  WBC 10.2 9.5 5.9  NEUTROABS 6.8  --   --   HGB 15.5* 13.7 13.8  HCT 47.4* 42.0 42.1  MCV 96.1 95.5 96.6  PLT 319 258 233   Cardiac Enzymes: No results for input(s): "CKTOTAL", "CKMB", "CKMBINDEX", "TROPONINI" in the last 72 hours. BNP: Invalid input(s): "POCBNP" D-Dimer: No results for input(s): "DDIMER" in the last 72 hours. Hemoglobin A1C: No results for input(s): "HGBA1C" in the last 72 hours. Fasting Lipid Panel: Recent Labs    12/16/21 0402  CHOL 227*   HDL 47  LDLCALC 125*  TRIG 273*  CHOLHDL 4.8   Thyroid Function Tests: No results for input(s): "TSH", "T4TOTAL", "T3FREE", "THYROIDAB" in the last 72 hours.  Invalid input(s): "FREET3" Anemia Panel: No results for input(s): "VITAMINB12", "FOLATE", "FERRITIN", "TIBC", "IRON", "RETICCTPCT" in the last 72 hours.  DG Chest 2 View  Result Date: 12/14/2021 CLINICAL DATA:  Chest pain. EXAM: CHEST - 2 VIEW COMPARISON:  December 13, 2021. FINDINGS: The heart size and mediastinal contours are within normal limits. Both lungs are clear. The visualized skeletal structures are unremarkable. IMPRESSION: No active cardiopulmonary disease. Electronically Signed   By: Marijo Conception M.D.   On: 12/14/2021 15:37     Echo patient, ventricular function EF of at least 55%  TELEMETRY: Normal sinus rhythm nonspecific ST-T wave changes rate of 70:  ASSESSMENT AND PLAN:  Principal Problem:   NSTEMI (non-ST elevated myocardial infarction) ([place]) Active Problems:   Cigarette smoker   Hemorrhoids, internal   Anxiety and depression   Gastroesophageal reflux disease   History of kidney stones   Encounter for smoking cessation counseling   Alcohol use    Plan Non-STEMI with elevated troponin by history with elevated troponin and chest pain symptoms with abnormal Myoview we  will proceed with cardiac cath for further assessment evaluation Advise patient refrain from tobacco abuse Cardiac cath today for evaluation of coronary anatomy with positive troponins if cath is negative we will treat conservatively medically Recommend aspirin and Plavix for 3 to 6 months blood pressure control tobacco cessation blood pressure management Recommend weight loss exercise portion control Continue PPIs for reflux type symptoms Avoid excessive alcohol consumption Have patient follow-up with cardiology as an outpatient 1 to 2 weeks   Yolonda Kida, MD 12/16/2021 7:17 AM

## 2021-12-17 DIAGNOSIS — I10 Essential (primary) hypertension: Secondary | ICD-10-CM

## 2021-12-17 DIAGNOSIS — E7849 Other hyperlipidemia: Secondary | ICD-10-CM

## 2021-12-17 LAB — CBC
HCT: 40.8 % (ref 36.0–46.0)
Hemoglobin: 13.1 g/dL (ref 12.0–15.0)
MCH: 30.9 pg (ref 26.0–34.0)
MCHC: 32.1 g/dL (ref 30.0–36.0)
MCV: 96.2 fL (ref 80.0–100.0)
Platelets: 214 10*3/uL (ref 150–400)
RBC: 4.24 MIL/uL (ref 3.87–5.11)
RDW: 13.5 % (ref 11.5–15.5)
WBC: 5.7 10*3/uL (ref 4.0–10.5)
nRBC: 0 % (ref 0.0–0.2)

## 2021-12-17 LAB — BASIC METABOLIC PANEL
Anion gap: 5 (ref 5–15)
BUN: 18 mg/dL (ref 6–20)
CO2: 22 mmol/L (ref 22–32)
Calcium: 8.8 mg/dL — ABNORMAL LOW (ref 8.9–10.3)
Chloride: 114 mmol/L — ABNORMAL HIGH (ref 98–111)
Creatinine, Ser: 0.67 mg/dL (ref 0.44–1.00)
GFR, Estimated: >60 mL/min (ref >60)
Glucose, Bld: 106 mg/dL — ABNORMAL HIGH (ref 70–99)
Potassium: 3.7 mmol/L (ref 3.5–5.1)
Sodium: 141 mmol/L (ref 135–145)

## 2021-12-17 LAB — ECHOCARDIOGRAM COMPLETE
AR max vel: 2.4 cm2
AV Area VTI: 2.37 cm2
AV Area mean vel: 2.32 cm2
AV Mean grad: 3 mmHg
AV Peak grad: 5.3 mmHg
Ao pk vel: 1.15 m/s
Area-P 1/2: 6.22 cm2
Calc EF: 63.3 %
Height: 68 in
S' Lateral: 2.4 cm
Single Plane A2C EF: 64.9 %
Single Plane A4C EF: 61.5 %
Weight: 2832 oz

## 2021-12-17 MED ORDER — DILTIAZEM HCL 30 MG PO TABS: 30.0000 mg | ORAL_TABLET | Freq: Two times a day (BID) | ORAL | 0 refills | Status: DC

## 2021-12-17 MED ORDER — ATORVASTATIN CALCIUM 40 MG PO TABS: 40.0000 mg | ORAL_TABLET | Freq: Every day | ORAL | 0 refills | Status: DC

## 2021-12-17 MED ORDER — ASPIRIN 81 MG PO CHEW: 81.0000 mg | CHEWABLE_TABLET | Freq: Every day | ORAL | 0 refills | Status: AC

## 2021-12-17 NOTE — Plan of Care (Signed)
  Problem: Education: Goal: Understanding of CV disease, CV risk reduction, and recovery process will improve Outcome: Progressing   Problem: Activity: Goal: Ability to return to baseline activity level will improve Outcome: Progressing   Problem: Cardiovascular: Goal: Ability to achieve and maintain adequate cardiovascular perfusion will improve Outcome: Progressing Goal: Vascular access site(s) Level 0-1 will be maintained Outcome: Progressing   

## 2021-12-17 NOTE — Progress Notes (Signed)
Veneda Melter to be D/C'd Home per MD order.  Discussed prescriptions and follow up appointments with the patient. medication list explained in detail. Pt verbalized understanding.    Vitals:   12/17/21 0859 12/17/21 1139  BP: 103/65 117/88  Pulse: 100 (!) 103  Resp: 18 18  Temp:  98.1 F (36.7 C)  SpO2: 99% 98%    Tele box removed and returned. Skin clean, dry and intact without evidence of skin break down, no evidence of skin tears noted. IV catheter discontinued intact. Site without signs and symptoms of complications. Dressing and pressure applied. Pt denies pain at this time. No complaints noted.  An After Visit Summary was printed and given to the patient. Patient escorted via [place], and D/C home via private auto.  Rolley Sims

## 2021-12-17 NOTE — Discharge Summary (Signed)
Physician Discharge Summary  Brianna Huff WIO:[id number] DOB: 02/27/1965 DOA: 12/14/2021  PCP: Teodora Medici, DO  Admit date: 12/14/2021 Discharge date: 12/17/2021  Admitted From: home  Disposition:  home   Recommendations for Outpatient Follow-up:  Follow up with PCP in 1-2 weeks F/u w/ cardio, Dr. Clayborn Bigness in 1-2 weeks  Home Health: no  Equipment/Devices:  Discharge Condition: stable CODE STATUS: full  Diet recommendation: Heart Healthy  Brief/Interim Summary: HPI was taken from Dr. Tobie Poet: Ms. Brianna Huff is a 56 year old female with history of GERD, who presents emergency department for chief concerns of chest pain.   Initial vitals in the emergency department showed temperature of 98.3, respiration rate of 16, heart rate of 125, blood pressure 116/78, SpO2 97% on room air.   Serum sodium is 140, potassium 3.7, chloride 106, bicarb 27, nonfasting glucose 140, BUN of 17, serum creatinine of 0.85, EGFR greater than 60.   WBC 10.2, hemoglobin 15.5, platelets of 319.   ED treatment: Aspirin 324 mg p.o. one-time dose, heparin bolus and gtt. ------------------------ At bedside patient was able to tell me her name, her age, her current location of hospital.   She reports that she woke up at approximately 4 AM on Sunday morning with chest pain/dull and pain radiating down her right arm.  She reports the pain is pressure-like. She endorses diaphoresis and sat in front of her box fan for relief. She endorses nausea and vomiting one time of small amount of liquid.   She called EMS and was given 4 tablets of aspirin and IV zofran. She presented to the emergency department on 12/13/2021 her pain improved.  She thought the wait was going to be too long in the emergency department and as her chest pain improved with aspirin, she left without being seen by an emergency medicine provider.   Of note her high sensitive troponin was 18 in the ED on 12/10.  She saw her PCP today and her  troponin was elevated to 261 and was told to come to the emergency department for further evaluation.     In the ED her troponin was elevated to 274 and on repeat was 276.    She reports that upon waking up on day of admission, she continued to have chest tightness that is very mild.  She reports she is never felt this way before.  She called her PCPs office and was able to get an appointment and be seen at 11 AM on 12/14/2021.  After labs were checked at her PCPs office she was advised to come to the emergency department for further evaluation.   She denies known personal history of heart disease.   As per Dr. Jimmye Norman 12/13-12/14/23: Pt was found to have NSTEMI. Pt initially had cardiac stress test which showed a fixed apical defect w/o reversible ischemia, consistent w/ a prior infarct. Cardio then recommended cardiac cath. No stents were placed during cardiac cath. Pt will need to f/u w/ cardio, Dr. Clayborn Bigness, in 1-2 weeks.   Discharge Diagnoses:  Principal Problem:   NSTEMI (non-ST elevated myocardial infarction) ([place]) Active Problems:   Cigarette smoker   Hemorrhoids, internal   Anxiety and depression   Gastroesophageal reflux disease   History of kidney stones   Encounter for smoking cessation counseling   Alcohol use   NSTEMI: w/ peak troponin 276. Cardiac stress test shows evidence prior infarct. No stents were placed during the cardiac cath. D/c IV heparin. Continue on aspirin, statin & diltiazem as per cardio.  Continue on tele    Alcohol use: reports a few drinks a day, no hx withdrawal. Will monitor    Tobacco abuse: pt refuses nicotine patch.    GERD: continue on PPI  Discharge Instructions  Discharge Instructions     Diet - low sodium heart healthy   Complete by: As directed    Discharge instructions   Complete by: As directed    F/u w/ cardio, Dr. Clayborn Bigness, in 1-2 weeks. F/u w/ PCP in 1-2 weeks   Increase activity slowly   Complete by: As directed           Follow-up Information     [place], Dwayne D, MD. Go in 1 week(s).   Specialties: Cardiology, Internal Medicine Why: 1-2 weeks Contact information: [place] [phone] [phone]                  Consultations: Cardio    Procedures/Studies: DG Chest 2 View  Result Date: 12/14/2021 CLINICAL DATA:  Chest pain. EXAM: CHEST - 2 VIEW COMPARISON:  December 13, 2021. FINDINGS: The heart size and mediastinal contours are within normal limits. Both lungs are clear. The visualized skeletal structures are unremarkable. IMPRESSION: No active cardiopulmonary disease. Electronically Signed   By: Marijo Conception M.D.   On: 12/14/2021 15:37   DG Chest 2 View  Result Date: 12/13/2021 CLINICAL DATA:  Chest pain EXAM: CHEST - 2 VIEW COMPARISON:  None available FINDINGS: Normal heart size and mediastinal contours. No acute infiltrate or edema. No effusion or pneumothorax. No acute osseous findings. IMPRESSION: Negative chest. Electronically Signed   By: Jorje Guild M.D.   On: 12/13/2021 05:25   (Echo, Carotid, EGD, Colonoscopy, ERCP)    Subjective: Pt denies any chest pain or shortness of breath    Discharge Exam: Vitals:   12/17/21 0748 12/17/21 0859  BP: (!) 105/49 103/65  Pulse: 92 100  Resp: 17 18  Temp: 98.5 F (36.9 C)   SpO2: 96% 99%   Vitals:   12/16/21 2341 12/17/21 0357 12/17/21 0748 12/17/21 0859  BP: 110/63 117/69 (!) 105/49 103/65  Pulse: (!) 101 89 92 100  Resp: _0 Temp: 97.7 F (36.5 C) 97.8 F (36.6 C) 98.5 F (36.9 C)   TempSrc: Oral  Oral   SpO2: 97% 98% 96% 99%  Weight:      Height:        General: Pt is alert, awake, not in acute distress Cardiovascular:  S1/S2 +, no rubs, no gallops Respiratory: CTA bilaterally, no wheezing, no rhonchi Abdominal: Soft, NT, ND, bowel sounds + Extremities: no edema, no cyanosis    The results of significant diagnostics from this hospitalization (including imaging,  microbiology, ancillary and laboratory) are listed below for reference.     Microbiology: No results found for this or any previous visit (from the past 240 hour(s)).   Labs: BNP (last 3 results) No results for input(s): "BNP" in the last 8760 hours. Basic Metabolic Panel: Recent Labs  Lab 12/13/21 0511 12/14/21 1454 12/15/21 0140 12/16/21 0402 12/17/21 0332  NA 139 140 138 140 141  K 3.7 3.7 3.7 3.8 3.7  CL 104 106 107 112* 114*  CO2 _1 GLUCOSE 142* 140* 107* 107* 106*  BUN 26* 17 21* 18 18  CREATININE 0.84 0.85 0.67 0.71 0.67  CALCIUM 9.1 9.3 8.7* 9.0 8.8*   Liver Function Tests: Recent Labs  Lab 12/14/21 1454  AST 27  ALT 20  ALKPHOS 92  BILITOT 0.4  PROT 7.3  ALBUMIN 4.3   No results for input(s): "LIPASE", "AMYLASE" in the last 168 hours. No results for input(s): "AMMONIA" in the last 168 hours. CBC: Recent Labs  Lab 12/13/21 0511 12/14/21 1454 12/15/21 0140 12/16/21 0402 12/17/21 0332  WBC 10.2 10.2 9.5 5.9 5.7  NEUTROABS  --  6.8  --   --   --   HGB 14.3 15.5* 13.7 13.8 13.1  HCT 44.1 47.4* 42.0 42.1 40.8  MCV 95.0 96.1 95.5 96.6 96.2  PLT 337 319 258 233 214   Cardiac Enzymes: No results for input(s): "CKTOTAL", "CKMB", "CKMBINDEX", "TROPONINI" in the last 168 hours. BNP: Invalid input(s): "POCBNP" CBG: No results for input(s): "GLUCAP" in the last 168 hours. D-Dimer No results for input(s): "DDIMER" in the last 72 hours. Hgb A1c Recent Labs    12/16/21 0402  HGBA1C 5.7*   Lipid Profile Recent Labs    12/16/21 0402  CHOL 227*  HDL 47  LDLCALC 125*  TRIG 273*  CHOLHDL 4.8   Thyroid function studies No results for input(s): "TSH", "T4TOTAL", "T3FREE", "THYROIDAB" in the last 72 hours.  Invalid input(s): "FREET3" Anemia work up No results for input(s): "VITAMINB12", "FOLATE", "FERRITIN", "TIBC", "IRON", "RETICCTPCT" in the last 72 hours. Urinalysis    Component Value Date/Time   COLORURINE YELLOW 01/31/2021 1632    APPEARANCEUR CLOUDY (A) 01/31/2021 1632   APPEARANCEUR Clear 12/25/2014 1521   LABSPEC 1.020 01/31/2021 1632   PHURINE 8.5 (H) 01/31/2021 1632   GLUCOSEU NEGATIVE 01/31/2021 1632   HGBUR MODERATE (A) 01/31/2021 1632   BILIRUBINUR NEGATIVE 01/31/2021 1632   BILIRUBINUR Negative 12/25/2014 1521   KETONESUR 40 (A) 01/31/2021 1632   PROTEINUR 30 (A) 01/31/2021 1632   NITRITE NEGATIVE 01/31/2021 1632   LEUKOCYTESUR NEGATIVE 01/31/2021 1632   Sepsis Labs Recent Labs  Lab 12/14/21 1454 12/15/21 0140 12/16/21 0402 12/17/21 0332  WBC 10.2 9.5 5.9 5.7   Microbiology No results found for this or any previous visit (from the past 240 hour(s)).   Time coordinating discharge: Over 30 minutes  SIGNED:   Wyvonnia Dusky, MD  [facility] 12/17/2021, 11:38 AM Pager   If 7PM-7AM, please contact night-coverage [email or web address]

## 2021-12-18 ENCOUNTER — Telehealth: Payer: Self-pay

## 2021-12-18 LAB — LIPOPROTEIN A (LPA): Lipoprotein (a): 231.6 nmol/L — ABNORMAL HIGH (ref <75.0)

## 2021-12-18 NOTE — Telephone Encounter (Signed)
Transition Care Management Follow-up Telephone Call Date of discharge and from where: [facility] 12/17/2021 How have you been since you were released from the hospital? better Any questions or concerns? Yes  Items Reviewed: Did the pt receive and understand the discharge instructions provided? Yes  Medications obtained and verified? Yes  Other? No  Any new allergies since your discharge? No  Dietary orders reviewed? Yes Do you have support at home? Yes   Home Care and Equipment/Supplies: Were home health services ordered? no If so, what is the name of the agency? N/a  Has the agency set up a time to come to the patient's home? not applicable Were any new equipment or medical supplies ordered?  No What is the name of the medical supply agency? N/a Were you able to get the supplies/equipment? not applicable Do you have any questions related to the use of the equipment or supplies? No  Functional Questionnaire: (I = Independent and D = Dependent) ADLs: I  Bathing/Dressing- I  Meal Prep- I  Eating- I  Maintaining continence- I  Transferring/Ambulation- I  Managing Meds- I  Follow up appointments reviewed:  PCP Hospital f/u appt confirmed? Yes  Scheduled to see Dr Rosana Berger on 12/21/2021 @ 9:40. [facility] f/u appt confirmed? No   Are transportation arrangements needed? No  If their condition worsens, is the pt aware to call PCP or go to the Emergency Dept.? Yes Was the patient provided with contact information for the PCP's office or ED? Yes Was to pt encouraged to call back with questions or concerns? Yes Juanda Crumble, LPN [place] Direct Dial [phone]

## 2021-12-20 NOTE — Progress Notes (Unsigned)
   Established Patient Office Visit  Subjective   Patient ID: Brianna Huff, female    DOB: [birth year]  Age: 56 y.o. MRN: [id number]  No chief complaint on file.   HPI  Patient presents for hospital follow up.   Discharge Date: 12/17/21 Diagnosis: NSTEMI Procedures/tests: Echo 12/26/21: EF 23-53%, grade 1 diastolic dysfunction. Cardiac catheterization 12/16/21 results unable to be viewed Consultants: Cardiology  New medications: Lipitor 40 mg, Cardizem 30 mg, aspirin 81 mg Discontinued medications: None Discharge instructions:  Follow up with Cardiology  Status: {Blank multiple:19196::"better","worse","stable","fluctuating"}   {History (Optional):23778}  ROS    Objective:     There were no vitals taken for this visit. {Vitals History (Optional):23777}  Physical Exam   No results found for any visits on 12/21/21.  {Labs (Optional):23779}  The ASCVD Risk score (Arnett DK, et al., 2019) failed to calculate for the following reasons:   The patient has a prior MI or stroke diagnosis    Assessment & Plan:   Problem List Items Addressed This Visit   None   No follow-ups on file.    Teodora Medici, DO

## 2021-12-21 ENCOUNTER — Ambulatory Visit: Payer: Managed Care, Other (non HMO) | Admitting: Internal Medicine

## 2021-12-21 ENCOUNTER — Encounter: Payer: Self-pay | Admitting: Internal Medicine

## 2021-12-21 VITALS — BP 118/72 | HR 111 | Temp 98.8°F | Resp 16 | Ht 68.0 in | Wt 177.4 lb

## 2021-12-21 DIAGNOSIS — Z09 Encounter for follow-up examination after completed treatment for conditions other than malignant neoplasm: Secondary | ICD-10-CM

## 2021-12-21 DIAGNOSIS — I214 Non-ST elevation (NSTEMI) myocardial infarction: Secondary | ICD-10-CM | POA: Diagnosis not present

## 2021-12-21 DIAGNOSIS — E782 Mixed hyperlipidemia: Secondary | ICD-10-CM | POA: Diagnosis not present

## 2021-12-21 DIAGNOSIS — F419 Anxiety disorder, unspecified: Secondary | ICD-10-CM

## 2021-12-21 MED ORDER — ESCITALOPRAM OXALATE 5 MG PO TABS: 5.0000 mg | ORAL_TABLET | Freq: Every day | ORAL | 1 refills | Status: DC

## 2021-12-21 MED ORDER — ATORVASTATIN CALCIUM 40 MG PO TABS: 40.0000 mg | ORAL_TABLET | Freq: Every day | ORAL | 1 refills | Status: DC

## 2021-12-21 NOTE — Patient Instructions (Signed)
It was great seeing you today!  Plan discussed at today's visit: -Lipitor sent to pharmacy -Start Lexapro 5 mg daily for anxiety   Follow up in: 6 weeks   Take care and let us know if you have any questions or concerns prior to your next visit.  Dr. Rosana Berger

## 2021-12-22 ENCOUNTER — Ambulatory Visit: Payer: Self-pay

## 2021-12-22 NOTE — Telephone Encounter (Signed)
Message from [place] sent at 12/22/2021  3:49 PM EST  Summary: knot in groin area   Pt states she was seen yesterday for a knot in her groin area  Pt states she was informed by provider if knot gets bigger to call office and she will prescribe antibiotics  Pt states knot has gotten bigger  Please assist further         Chief Complaint: red and painful knot to right groin area Symptoms: 1 inch wide after shower. Pt stated the cyst was much bigger before she applied heat to it.  Frequency: came up after heart catherization Pertinent Negatives: Patient denies fever Disposition: '[]'$ ED /'[]'$ Urgent Care (no appt availability in office) / '[]'$ Appointment(In office/virtual)/ '[]'$  [place] [facility]/ '[]'$ Home Care/ '[]'$ Refused Recommended Disposition /'[]'$  Mobile Bus/ '[x]'$  Follow-up with PCP Additional Notes: Pt was in office yesterday and knot was noted. Pt stated Dr Rosana Berger told her to call back if knot gets bigger and she will call some abx in for her. [facility] [address] [facility].   Reason for Disposition  [1] Swelling is painful to touch AND [2] no fever  Answer Assessment - Initial Assessment Questions 1. APPEARANCE of SWELLING: "What does it look like?"     Red 2. SIZE: "How large is the swelling?" (e.g., inches, cm; or compare to size of pinhead, tip of pen, eraser, coin, pea, grape, ping pong ball)      1 inch 3. LOCATION: "Where is the swelling located?"     Right groin 4. ONSET: "When did the swelling start?"     today 5. COLOR: "What color is it?" "Is there more than one color?"     red 6. PAIN: "Is there any pain?" If Yes, ask: "How bad is the pain?" (e.g., scale 1-10; or mild, moderate, severe)     - NONE (0): no pain   - MILD (1-3): doesn't interfere with normal activities    - MODERATE (4-7): interferes with normal activities or awakens from sleep    - SEVERE (8-10): excruciating pain, unable to do any normal activities     8/10 severe when  touched 7. ITCH: "Does it itch?" If Yes, ask: "How bad is the itch?"      no 8. CAUSE: "What do you think caused the swelling?" 9 OTHER SYMPTOMS: "Do you have any other symptoms?" (e.g., fever)     no  Protocols used: Skin Lump or Localized Swelling-A-AH

## 2021-12-23 ENCOUNTER — Other Ambulatory Visit: Payer: Self-pay | Admitting: Internal Medicine

## 2021-12-23 ENCOUNTER — Encounter: Payer: Self-pay | Admitting: Internal Medicine

## 2021-12-23 DIAGNOSIS — L0291 Cutaneous abscess, unspecified: Secondary | ICD-10-CM

## 2021-12-23 LAB — CARDIAC CATHETERIZATION: Cath EF Quantitative: 60 %

## 2021-12-23 MED ORDER — DOXYCYCLINE HYCLATE 100 MG PO TABS: 100.0000 mg | ORAL_TABLET | Freq: Two times a day (BID) | ORAL | 0 refills | Status: AC

## 2021-12-30 ENCOUNTER — Telehealth: Payer: Self-pay | Admitting: Internal Medicine

## 2021-12-30 NOTE — Telephone Encounter (Signed)
Medication Refill - Medication: pantoprazole (PROTONIX) 40 MG tablet  2x daily instead of 1x daily. Patient has 3 more days.   Has the patient contacted their pharmacy? Yes.   Pharmacy is requesting new script.    Preferred Pharmacy (with phone number or street name):  [id number] [place] [place] [address]: [phone] Fax: [phone]     Has the patient been seen for an appointment in the last year OR does the patient have an upcoming appointment? Yes.    Agent: Please be advised that RX refills may take up to 3 business days. We ask that you follow-up with your pharmacy.

## 2021-12-31 ENCOUNTER — Ambulatory Visit: Payer: Self-pay | Admitting: *Deleted

## 2021-12-31 NOTE — Telephone Encounter (Signed)
Called patient told her to just monitor if it becomes red, painful or develops pus she will need to have it checked out.  Otherwise could to a while to completley go away.  If concerned she could make appt to have checked out.

## 2021-12-31 NOTE — Telephone Encounter (Signed)
Reason for Disposition  [1] Small swelling or lump AND [2] unexplained AND [3] present > 1 week    Saw Dr. Rosana Berger about cyst in groin.  Completed the antibiotic but it's still there.  It's much better than it was.  Answer Assessment - Initial Assessment Questions 1. APPEARANCE of SWELLING: "What does it look like?"     The cyst in my groin is a lot better but it's still there.   It's not infected.   It's 1/2 wide.    No drainage.   It's little bruised   2. SIZE: "How large is the swelling?" (e.g., inches, cm; or compare to size of pinhead, tip of pen, eraser, coin, pea, grape, ping pong ball)      Feels like a lump or a pea under my skin.   It's gotten a lot better with the antibiotic. 3. LOCATION: "Where is the swelling located?"     The cyst in my groin I saw Dr. Rosana Berger for. I will send a note to Dr. Rosana Berger and someone will contact you with her decision. 4. ONSET: "When did the swelling start?"     Not asked since already being treated. 5. COLOR: "What color is it?" "Is there more than one color?"      6. PAIN: "Is there any pain?" If Yes, ask: "How bad is the pain?" (e.g., scale 1-10; or mild, moderate, severe)     - NONE (0): no pain   - MILD (1-3): doesn't interfere with normal activities    - MODERATE (4-7): interferes with normal activities or awakens from sleep    - SEVERE (8-10): excruciating pain, unable to do any normal activities      7. ITCH: "Does it itch?" If Yes, ask: "How bad is the itch?"       8. CAUSE: "What do you think caused the swelling?" 9 OTHER SYMPTOMS: "Do you have any other symptoms?" (e.g., fever)  Protocols used: Skin Lump or Localized Swelling-A-AH  Chief Complaint: Cyst in groin is much improved after the antibiotic but it's still there Symptoms: No drainage or pain or signs of infection.   It's just still there Frequency: N/A Pertinent Negatives: Patient denies drainage or pain Disposition: '[]'$ ED /'[]'$ Urgent Care (no appt availability in office) /  '[]'$ Appointment(In office/virtual)/ '[]'$  [place] Virtual [facility]/ '[]'$ Home Care/ '[]'$ Refused Recommended Disposition /'[]'$ [place] Mobile Bus/ '[x]'$  Follow-up with PCP Additional Notes: Message sent to Dr. Rosana Berger.   Pt agreeable to someone calling her back.  Pt also needing the rx for her protonix changed to twice a day.   They won't fill it until the wording is changed.   She went from 1 a day to 2 a day so the rx needs to be changed to reflect that.

## 2021-12-31 NOTE — Telephone Encounter (Signed)
Summary: cyst on groin area   Patient states cyst is still persistent on the groin area and has completed the doxycycline (VIBRA-TABS) 100 MG tablet. Patient seeking clinical advice.

## 2022-01-01 ENCOUNTER — Telehealth: Payer: Self-pay | Admitting: Internal Medicine

## 2022-01-01 NOTE — Telephone Encounter (Signed)
Noted that patient is taking pantoprazole BID not daily as written, so the patient is running out. Routing to provider for a new Rx, if indicated.

## 2022-01-01 NOTE — Telephone Encounter (Signed)
Medication Refill - Medication:  pantoprazole (PROTONIX) 40 MG tablet     Patient is taking twice a day now as directed by Dr Rosana Berger, pharmacy says too early , wont be covered with insurance, and can't fill untl Jan 3, but patient is all out  Has the patient contacted their pharmacy? yes (Agent: If no, request that the patient contact the pharmacy for the refill. If patient does not wish to contact the pharmacy document the reason why and proceed with request.) (Agent: If yes, when and what did the pharmacy advise?)  Preferred Pharmacy (with phone number or street name):  [id number] [address]: [phone] Fax: [phone]    Has the patient been seen for an appointment in the last year OR does the patient have an upcoming appointment? yes  Agent: Please be advised that RX refills may take up to 3 business days. We ask that you follow-up with your pharmacy.

## 2022-01-05 ENCOUNTER — Other Ambulatory Visit: Payer: Self-pay | Admitting: Internal Medicine

## 2022-01-05 DIAGNOSIS — K219 Gastro-esophageal reflux disease without esophagitis: Secondary | ICD-10-CM

## 2022-01-05 MED ORDER — PANTOPRAZOLE SODIUM 40 MG PO TBEC: 40.0000 mg | DELAYED_RELEASE_TABLET | Freq: Two times a day (BID) | ORAL | 1 refills | Status: DC

## 2022-01-05 NOTE — Telephone Encounter (Signed)
Patient states per Pharmacy her insurance is not going to cover the change to the medication pantoprazole (PROTONIX) 40 MG tablet . Original prescription was ordered one '40MG'$  per day and was changed to take one '40MG'$  by mouth two times daily.  I called to the pharmacy and per Aaron Edelman the patients insurance will only cover one '40MG'$  tablet per day.  Please advise.

## 2022-01-06 NOTE — Telephone Encounter (Signed)
Requested medication (s) are due for refill today:   Yes  Requested medication (s) are on the active medication list:   Yes  Future visit scheduled:   Yes   Last ordered: 01/05/2022 #90, 1 refill  Returned because insurance will not cover 2 pills a day.   See pharmacy note.      Requested Prescriptions  Pending Prescriptions Disp Refills   pantoprazole (PROTONIX) 40 MG tablet [Pharmacy Med Name: PANTOPRAZOLE SOD DR 40 MG TAB] 90 tablet 1    Sig: TAKE 1 TABLET BY MOUTH TWICE A DAY     Gastroenterology: Proton Pump Inhibitors Passed - 01/05/2022  9:40 AM      Passed - Valid encounter within last 12 months    Recent Outpatient Visits           2 weeks ago Hospital discharge follow-up   [place], DO   3 weeks ago Elevated troponin   [facility] Teodora Medici, DO   1 month ago Chronic allergic rhinitis   [facility] Teodora Medici, DO   6 months ago Upper respiratory tract infection, unspecified type   [place], PA-C   8 months ago Gastroesophageal reflux disease, unspecified whether esophagitis present   [facility] Teodora Medici, DO       Future Appointments             In 3 weeks Teodora Medici, [facility], [facility]

## 2022-01-06 NOTE — Telephone Encounter (Signed)
Please re do for once a day ins will not cover bid

## 2022-01-07 ENCOUNTER — Other Ambulatory Visit: Payer: Self-pay | Admitting: Internal Medicine

## 2022-01-07 DIAGNOSIS — K219 Gastro-esophageal reflux disease without esophagitis: Secondary | ICD-10-CM

## 2022-01-07 MED ORDER — PANTOPRAZOLE SODIUM 40 MG PO TBEC: 40.0000 mg | DELAYED_RELEASE_TABLET | Freq: Every day | ORAL | 3 refills | Status: DC

## 2022-01-08 ENCOUNTER — Telehealth: Payer: Self-pay | Admitting: Internal Medicine

## 2022-01-08 ENCOUNTER — Other Ambulatory Visit: Payer: Self-pay | Admitting: Internal Medicine

## 2022-01-08 DIAGNOSIS — K219 Gastro-esophageal reflux disease without esophagitis: Secondary | ICD-10-CM

## 2022-01-08 MED ORDER — PANTOPRAZOLE SODIUM 40 MG PO TBEC: 40.0000 mg | DELAYED_RELEASE_TABLET | Freq: Two times a day (BID) | ORAL | 3 refills | Status: DC

## 2022-01-08 NOTE — Telephone Encounter (Signed)
Please change again and I will try prior auth?

## 2022-01-08 NOTE — Telephone Encounter (Signed)
The patient called in stating her pantoprazole (PROTONIX) 40 MG tablet written for twice a day requires a prior authorization. She states taking it once a day just doesn't help her any longer and needs to be able to take it twice a day. Please assist patient further.

## 2022-01-12 ENCOUNTER — Other Ambulatory Visit: Payer: Self-pay | Admitting: Internal Medicine

## 2022-01-12 DIAGNOSIS — F419 Anxiety disorder, unspecified: Secondary | ICD-10-CM

## 2022-01-12 NOTE — Telephone Encounter (Signed)
Requested medication (s) are due for refill today: yes  Requested medication (s) are on the active medication list: yes  Last refill:  12/21/21 #30 1 refill  Future visit scheduled: yes in 2 weeks  Notes to clinic:  Pharmacy comment: [facility]. DX Code Needed.         Requested Prescriptions  Pending Prescriptions Disp Refills   escitalopram (LEXAPRO) 5 MG tablet [Pharmacy Med Name: ESCITALOPRAM 5 MG TABLET] 90 tablet 1    Sig: Take 1 tablet (5 mg total) by mouth daily.     Psychiatry:  Antidepressants - SSRI Passed - 01/12/2022 12:32 PM      Passed - Completed PHQ-2 or PHQ-9 in the last 360 days      Passed - Valid encounter within last 6 months    Recent Outpatient Visits           3 weeks ago Hospital discharge follow-up   [place], DO   4 weeks ago Elevated troponin   [place], DO   2 months ago Chronic allergic rhinitis   [facility] Teodora Medici, DO   6 months ago Upper respiratory tract infection, unspecified type   [place], PA-C   9 months ago Gastroesophageal reflux disease, unspecified whether esophagitis present   [facility] Teodora Medici, DO       Future Appointments             In 2 weeks Teodora Medici, [facility], [facility]

## 2022-01-29 ENCOUNTER — Ambulatory Visit: Payer: Managed Care, Other (non HMO) | Admitting: Internal Medicine

## 2022-02-03 ENCOUNTER — Other Ambulatory Visit: Payer: Self-pay | Admitting: Internal Medicine

## 2022-02-03 DIAGNOSIS — J309 Allergic rhinitis, unspecified: Secondary | ICD-10-CM

## 2022-02-03 NOTE — Telephone Encounter (Signed)
Requested Prescriptions  Pending Prescriptions Disp Refills   Azelastine HCl 137 MCG/SPRAY SOLN [Pharmacy Med Name: AZELASTINE 0.1% (137 MCG) SPRY] 30 mL 0    Sig: PLACE 1 SPRAY INTO BOTH NOSTRILS 2 (TWO) TIMES DAILY. USE IN EACH NOSTRIL AS DIRECTED     Ear, Nose, and Throat: Nasal Preparations - Antiallergy Passed - 02/03/2022  1:34 AM      Passed - Valid encounter within last 12 months    Recent Outpatient Visits           1 month ago Hospital discharge follow-up   [facility] Teodora Medici, DO   1 month ago Elevated troponin   [facility] Teodora Medici, DO   2 months ago Chronic allergic rhinitis   [facility] Teodora Medici, DO   7 months ago Upper respiratory tract infection, unspecified type   [place], PA-C   9 months ago Gastroesophageal reflux disease, unspecified whether esophagitis present   [facility] Teodora Medici, [place]

## 2022-02-05 ENCOUNTER — Other Ambulatory Visit: Payer: Self-pay | Admitting: Nurse Practitioner

## 2022-02-05 ENCOUNTER — Other Ambulatory Visit
Admission: RE | Admit: 2022-02-05 | Discharge: 2022-02-05 | Disposition: A | Discharge status: Home / Self Care | Payer: Managed Care, Other (non HMO) | Source: Ambulatory Visit | Attending: Nurse Practitioner | Admitting: Nurse Practitioner

## 2022-02-05 ENCOUNTER — Ambulatory Visit: Payer: Managed Care, Other (non HMO) | Admitting: Nurse Practitioner

## 2022-02-05 ENCOUNTER — Ambulatory Visit: Payer: Self-pay

## 2022-02-05 ENCOUNTER — Encounter: Payer: Self-pay | Admitting: Nurse Practitioner

## 2022-02-05 ENCOUNTER — Ambulatory Visit
Admission: RE | Admit: 2022-02-05 | Discharge: 2022-02-05 | Disposition: A | Discharge status: Home / Self Care | Payer: Managed Care, Other (non HMO) | Source: Ambulatory Visit | Attending: Nurse Practitioner | Admitting: Nurse Practitioner

## 2022-02-05 VITALS — BP 116/70 | HR 121 | Temp 98.6°F | Resp 18 | Ht 68.0 in | Wt 181.3 lb

## 2022-02-05 DIAGNOSIS — R1032 Left lower quadrant pain: Secondary | ICD-10-CM

## 2022-02-05 DIAGNOSIS — K5792 Diverticulitis of intestine, part unspecified, without perforation or abscess without bleeding: Secondary | ICD-10-CM

## 2022-02-05 LAB — CBC WITH DIFFERENTIAL/PLATELET
Abs Immature Granulocytes: 0.03 10*3/uL (ref 0.00–0.07)
Basophils Absolute: 0 10*3/uL (ref 0.0–0.1)
Basophils Relative: 0 %
Eosinophils Absolute: 0.1 10*3/uL (ref 0.0–0.5)
Eosinophils Relative: 1 %
HCT: 40.3 % (ref 36.0–46.0)
Hemoglobin: 13.4 g/dL (ref 12.0–15.0)
Immature Granulocytes: 0 %
Lymphocytes Relative: 19 %
Lymphs Abs: 1.9 10*3/uL (ref 0.7–4.0)
MCH: 31.8 pg (ref 26.0–34.0)
MCHC: 33.3 g/dL (ref 30.0–36.0)
MCV: 95.5 fL (ref 80.0–100.0)
Monocytes Absolute: 0.7 10*3/uL (ref 0.1–1.0)
Monocytes Relative: 7 %
Neutro Abs: 7.2 10*3/uL (ref 1.7–7.7)
Neutrophils Relative %: 73 %
Platelets: 274 10*3/uL (ref 150–400)
RBC: 4.22 MIL/uL (ref 3.87–5.11)
RDW: 12.5 % (ref 11.5–15.5)
WBC: 9.9 10*3/uL (ref 4.0–10.5)
nRBC: 0 % (ref 0.0–0.2)

## 2022-02-05 LAB — COMPREHENSIVE METABOLIC PANEL
ALT: 23 U/L (ref 0–44)
AST: 26 U/L (ref 15–41)
Albumin: 4.2 g/dL (ref 3.5–5.0)
Alkaline Phosphatase: 101 U/L (ref 38–126)
Anion gap: 7 (ref 5–15)
BUN: 13 mg/dL (ref 6–20)
CO2: 29 mmol/L (ref 22–32)
Calcium: 9.3 mg/dL (ref 8.9–10.3)
Chloride: 103 mmol/L (ref 98–111)
Creatinine, Ser: 0.76 mg/dL (ref 0.44–1.00)
GFR, Estimated: >60 mL/min (ref >60)
Glucose, Bld: 110 mg/dL — ABNORMAL HIGH (ref 70–99)
Potassium: 3.8 mmol/L (ref 3.5–5.1)
Sodium: 139 mmol/L (ref 135–145)
Total Bilirubin: 0.7 mg/dL (ref 0.3–1.2)
Total Protein: 7.1 g/dL (ref 6.5–8.1)

## 2022-02-05 LAB — POCT URINALYSIS DIPSTICK
Bilirubin, UA: NEGATIVE
Glucose, UA: NEGATIVE
Ketones, UA: NEGATIVE
Leukocytes, UA: NEGATIVE
Nitrite, UA: NEGATIVE
Odor: NORMAL
Protein, UA: NEGATIVE
Spec Grav, UA: 1.02 (ref 1.010–1.025)
Urobilinogen, UA: 0.2 E.U./dL
pH, UA: 5.5 (ref 5.0–8.0)

## 2022-02-05 MED ORDER — METRONIDAZOLE 500 MG PO TABS: 500.0000 mg | ORAL_TABLET | Freq: Three times a day (TID) | ORAL | 0 refills | Status: AC

## 2022-02-05 MED ORDER — CIPROFLOXACIN HCL 500 MG PO TABS: 500.0000 mg | ORAL_TABLET | Freq: Two times a day (BID) | ORAL | 0 refills | Status: AC

## 2022-02-05 MED ORDER — IOHEXOL 350 MG/ML SOLN
75.0000 mL | Freq: Once | INTRAVENOUS | Status: AC | PRN
  Administered 2022-02-05: 75 mL via INTRAVENOUS

## 2022-02-05 NOTE — Telephone Encounter (Signed)
      Chief Complaint: Lower abdominal pain Symptoms: Diarrhea yesterday with yellow stool Frequency: Yesterday Pertinent Negatives: Patient denies fever Disposition: '[]'$ ED /'[]'$ Urgent Care (no appt availability in office) / '[x]'$ Appointment(In office/virtual)/ '[]'$  [place] [facility]/ '[]'$ Home Care/ '[]'$ Refused Recommended Disposition /'[]'$ [place] [facility]/ '[]'$  Follow-up with PCP Additional Notes:   Reason for Disposition  [1] MODERATE pain (e.g., interferes with normal activities) AND [2] pain comes and goes (cramps) AND [3] present > 24 hours  (Exception: Pain with Vomiting or Diarrhea - see that Guideline.)  Answer Assessment - Initial Assessment Questions 1. LOCATION: "Where does it hurt?"      Lower 2. RADIATION: "Does the pain shoot anywhere else?" (e.g., chest, back)     Back 3. ONSET: "When did the pain begin?" (e.g., minutes, hours or days ago)      Yesterday 4. SUDDEN: "Gradual or sudden onset?"     Sudden 5. PATTERN "Does the pain come and go, or is it constant?"    - If it comes and goes: "How long does it last?" "Do you have pain now?"     (Note: Comes and goes means the pain is intermittent. It goes away completely between bouts.)    - If constant: "Is it getting better, staying the same, or getting worse?"      (Note: Constant means the pain never goes away completely; most serious pain is constant and gets worse.)      Constant 6. SEVERITY: "How bad is the pain?"  (e.g., Scale 1-10; mild, moderate, or severe)    - MILD (1-3): Doesn't interfere with normal activities, abdomen soft and not tender to touch.     - MODERATE (4-7): Interferes with normal activities or awakens from sleep, abdomen tender to touch.     - SEVERE (8-10): Excruciating pain, doubled over, unable to do any normal activities.       Now - 3 7. RECURRENT SYMPTOM: "Have you ever had this type of stomach pain before?" If Yes, ask: "When was the last time?" and "What happened that time?"      No 8.  CAUSE: "What do you think is causing the stomach pain?"     Some movemen 9. RELIEVING/AGGRAVATING FACTORS: "What makes it better or worse?" (e.g., antacids, bending or twisting motion, bowel movement)     No 10. OTHER SYMPTOMS: "Do you have any other symptoms?" (e.g., back pain, diarrhea, fever, urination pain, vomiting)       Diarrhea 11. PREGNANCY: "Is there any chance you are pregnant?" "When was your last menstrual period?"       No  Protocols used: Abdominal Pain - [facility]

## 2022-02-05 NOTE — Progress Notes (Signed)
BP 116/70   Pulse (!) 121   Temp 98.6 F (37 C)   Resp 18   Ht '5\' 8"'$  (1.727 m)   Wt 181 lb 4.8 oz (82.2 kg)   SpO2 98%   BMI 27.57 kg/m    Subjective:    Patient ID: Brianna Huff, female    DOB: 05-Jun-1965, 57 y.o.   MRN: [id number]  HPI: Brianna Huff is a 57 y.o. female  Chief Complaint  Patient presents with   Abdominal Pain    Lower onset 2 days   Lower abdominal pain: she says her pain started yesterday.  She says that she is having lower,constant, she says it is sharp she says she feels like something is wrong with her intestines. She says she has had constipation and diarrhea.  She denies any fever, nausea or vomiting.  She says she has never had this pain before.  She has tenderness on all four quadrants but especially the left lower quadrant.  She says she just ate.  Will order stat labs and ct abdomen pelvis.  Patient instructed to do a liquid diet.  Seek emergency care if symptoms worsen.   Relevant past medical, surgical, family and social history reviewed and updated as indicated. Interim medical history since our last visit reviewed. Allergies and medications reviewed and updated.  Review of Systems  Constitutional: Negative for fever or weight change.  Respiratory: Negative for cough and shortness of breath.   Cardiovascular: Negative for chest pain or palpitations.  Gastrointestinal: positive for abdominal pain, positive bowel changes. Constipation/diarrhea Musculoskeletal: Negative for gait problem or joint swelling.  Skin: Negative for rash.  Neurological: Negative for dizziness or headache.  No other specific complaints in a complete review of systems (except as listed in HPI above).      Objective:    BP 116/70   Pulse (!) 121   Temp 98.6 F (37 C)   Resp 18   Ht '5\' 8"'$  (1.727 m)   Wt 181 lb 4.8 oz (82.2 kg)   SpO2 98%   BMI 27.57 kg/m   Wt Readings from Last 3 Encounters:  02/05/22 181 lb 4.8 oz (82.2 kg)  12/21/21 177 lb 6.4 oz (80.5  kg)  12/14/21 177 lb (80.3 kg)    Physical Exam  Constitutional: Patient appears well-developed and well-nourished.  No distress.  HEENT: head atraumatic, normocephalic, pupils equal and reactive to light, neck supple Cardiovascular: Normal rate, regular rhythm and normal heart sounds.  No murmur heard. No BLE edema. Pulmonary/Chest: Effort normal and breath sounds normal. No respiratory distress. Abdominal: Soft.  Tenderness noted through out abdomen, however significant on LLQ.  Psychiatric: Patient has a normal mood and affect. behavior is normal. Judgment and thought content normal.  Results for orders placed or performed in visit on 02/05/22  POCT urinalysis dipstick  Result Value Ref Range   Color, UA drk yellow    Clarity, UA clear    Glucose, UA Negative Negative   Bilirubin, UA neg    Ketones, UA neg    Spec Grav, UA 1.020 1.010 - 1.025   Blood, UA trace    pH, UA 5.5 5.0 - 8.0   Protein, UA Negative Negative   Urobilinogen, UA 0.2 0.2 or 1.0 E.U./dL   Nitrite, UA neg    Leukocytes, UA Negative Negative   Appearance clear    Odor normal       Assessment & Plan:   Problem List Items Addressed This Visit  None Visit Diagnoses     Left lower quadrant abdominal pain    -  Primary   urine negative, orderd stat ct, labs and patient to start liquid diet.   Relevant Orders   POCT urinalysis dipstick (Completed)   CT Abdomen Pelvis W Contrast   CBC with Differential/Platelet   COMPLETE METABOLIC PANEL WITH GFR        Follow up plan: Return if symptoms worsen or fail to improve.

## 2022-02-05 NOTE — Addendum Note (Signed)
Addended by: Docia Furl on: 02/05/2022 02:33 PM   Modules accepted: Orders

## 2022-02-08 LAB — NM MYOCAR MULTI W/SPECT W/WALL MOTION / EF
Angina Index: 0
Base ST Depression (mm): 0 mm
Duke Treadmill Score: 4
Estimated workload: 5.8
Exercise duration (min): 4 min
LV dias vol: 62 mL (ref 46–106)
LV sys vol: 22 mL
Nuc Stress EF: 65 %
Peak HR: 162 {beats}/min
Percent HR: 98 %
Rest Nuclear Isotope Dose: 9.9 mCi
SDS: 3
SRS: 9
SSS: 8
ST Depression (mm): 0 mm
Stress Nuclear Isotope Dose: 28.3 mCi
TID: 0.71

## 2022-02-17 ENCOUNTER — Other Ambulatory Visit: Payer: Self-pay | Admitting: Internal Medicine

## 2022-02-17 DIAGNOSIS — J309 Allergic rhinitis, unspecified: Secondary | ICD-10-CM

## 2022-02-17 NOTE — Telephone Encounter (Signed)
Requested Prescriptions  Pending Prescriptions Disp Refills   Azelastine HCl 137 MCG/SPRAY SOLN [Pharmacy Med Name: AZELASTINE 0.1% (137 MCG) SPRY] 90 mL 0    Sig: PLACE 1 SPRAY INTO BOTH NOSTRILS 2 (TWO) TIMES DAILY. USE IN EACH NOSTRIL AS DIRECTED     Ear, Nose, and Throat: Nasal Preparations - Antiallergy Passed - 02/17/2022 10:12 AM      Passed - Valid encounter within last 12 months    Recent Outpatient Visits           1 week ago Left lower quadrant abdominal pain   [facility] Bo Merino, FNP   1 month ago Hospital discharge follow-up   [place], DO   2 months ago Elevated troponin   [facility] Teodora Medici, DO   3 months ago Chronic allergic rhinitis   [facility] Teodora Medici, DO   7 months ago Upper respiratory tract infection, unspecified type   [facility] Mecum, Dani Gobble, [place]

## 2022-02-18 ENCOUNTER — Other Ambulatory Visit: Payer: Self-pay | Admitting: Dermatology

## 2022-02-18 DIAGNOSIS — L219 Seborrheic dermatitis, unspecified: Secondary | ICD-10-CM

## 2022-04-11 ENCOUNTER — Other Ambulatory Visit: Payer: Self-pay | Admitting: Internal Medicine

## 2022-04-11 DIAGNOSIS — F419 Anxiety disorder, unspecified: Secondary | ICD-10-CM

## 2022-04-13 NOTE — Telephone Encounter (Signed)
Requested Prescriptions  Pending Prescriptions Disp Refills   escitalopram (LEXAPRO) 5 MG tablet [Pharmacy Med Name: ESCITALOPRAM 5 MG TABLET] 90 tablet 0    Sig: TAKE 1 TABLET (5 MG TOTAL) BY MOUTH DAILY.     Psychiatry:  Antidepressants - SSRI Passed - 04/11/2022  9:49 AM      Passed - Completed PHQ-2 or PHQ-9 in the last 360 days      Passed - Valid encounter within last 6 months    Recent Outpatient Visits           2 months ago Left lower quadrant abdominal pain   [facility] Berniece Salines, FNP   3 months ago Hospital discharge follow-up   [facility] Margarita Mail, DO   4 months ago Elevated troponin   [facility] Margarita Mail, DO   5 months ago Chronic allergic rhinitis   [facility] Margarita Mail, DO   9 months ago Upper respiratory tract infection, unspecified type   [facility] Mecum, Oswaldo Conroy, PA-C       Future Appointments             In 3 weeks Neale Burly, [place], MD [facility]

## 2022-04-22 IMAGING — CT CT RENAL STONE PROTOCOL
2 of 4 series · 16 of 46 positions shown, 18 images · non-contrast
Comparison: 11/28/2014

CLINICAL DATA: Right flank pain, history of kidney stones



[Series 2: stone full standard · axial · 0.77mm/px · z∈[-1008,-568]mm · 13 of 96 slices shown, 15 images]
[im 4/96  soft-tissue]
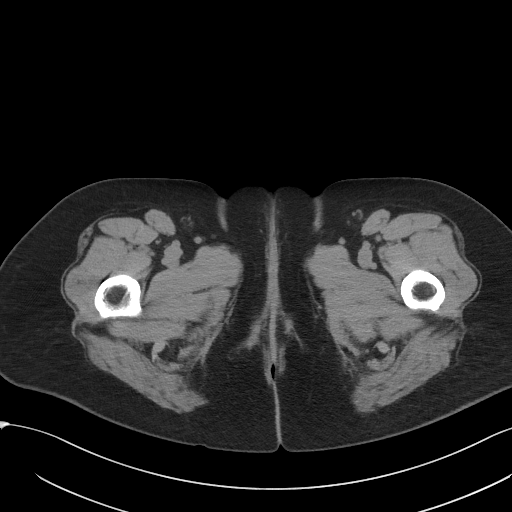
[im 4/96  bone]
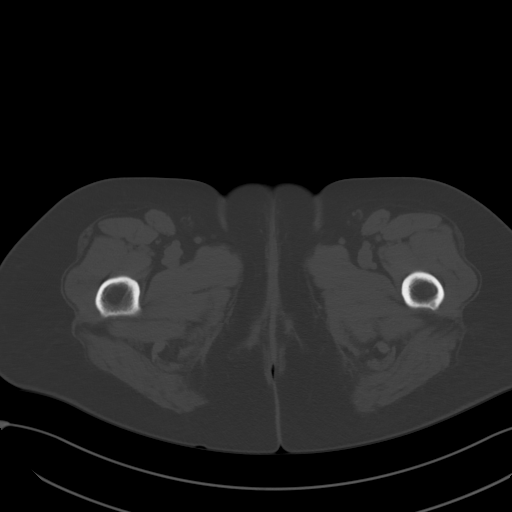
[im 12/96  soft-tissue]
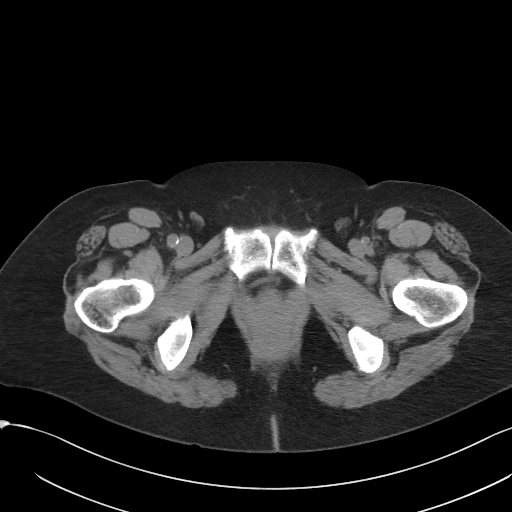
[im 20/96  soft-tissue]
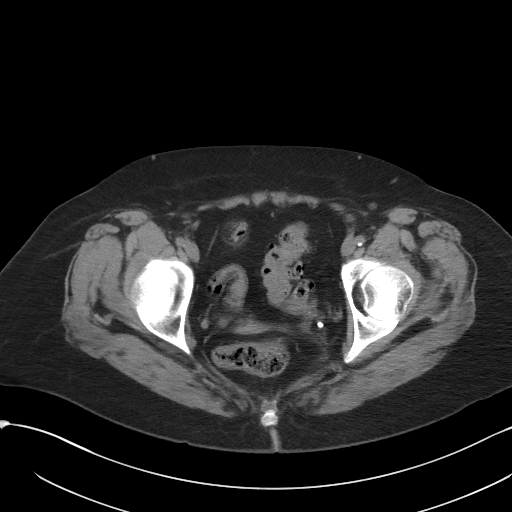
[im 28/96  soft-tissue]
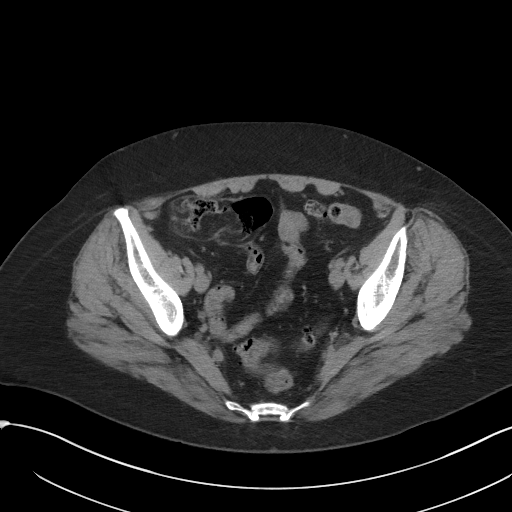
[im 32/96  soft-tissue]
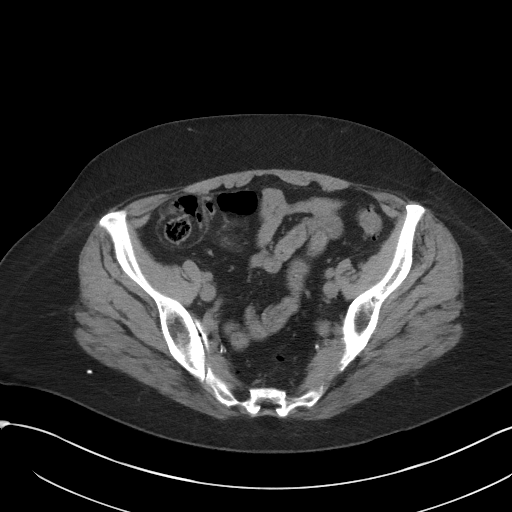
[im 40/96  soft-tissue]
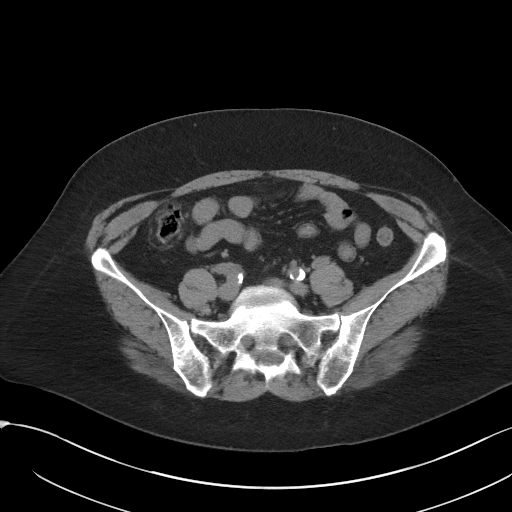
[im 48/96  soft-tissue]
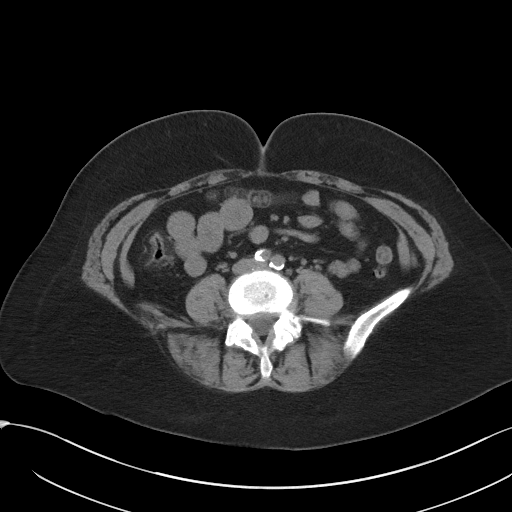
[im 56/96  soft-tissue]
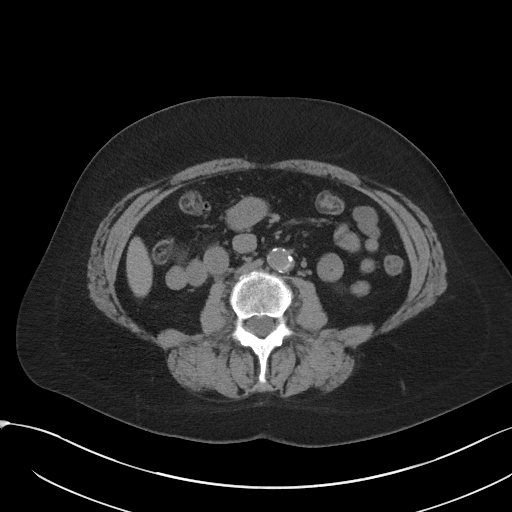
[im 64/96  soft-tissue]
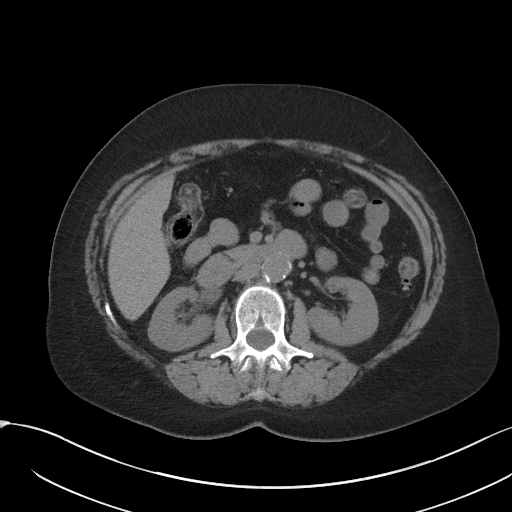
[im 64/96  bone]
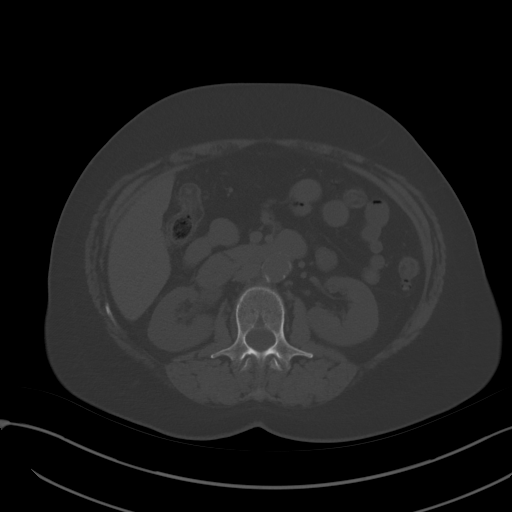
[im 68/96  soft-tissue]
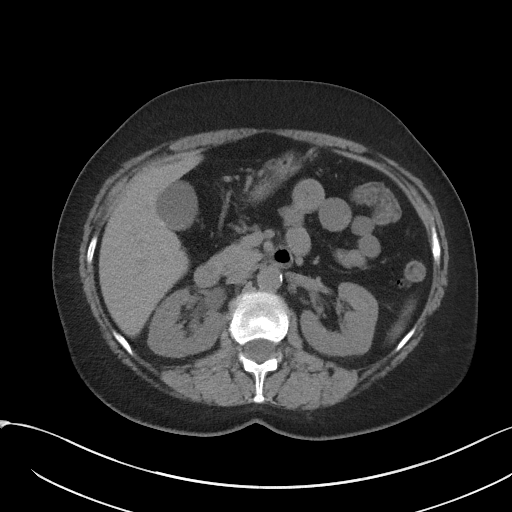
[im 76/96  soft-tissue]
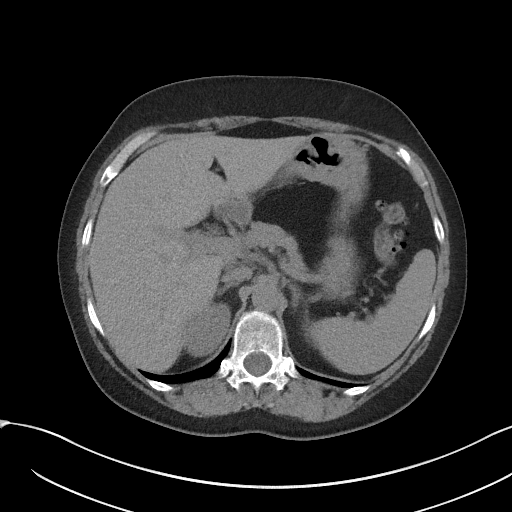
[im 84/96  soft-tissue]
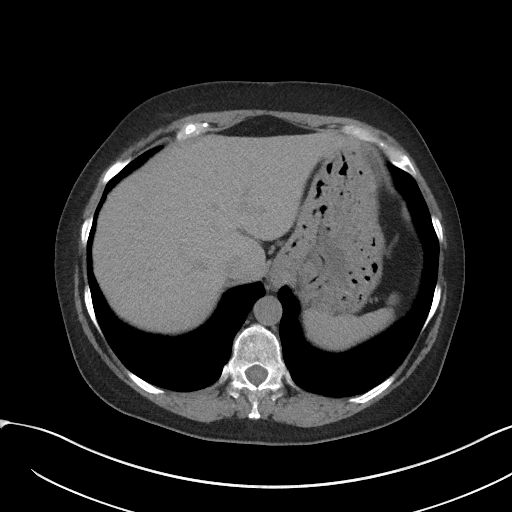
[im 92/96  soft-tissue]
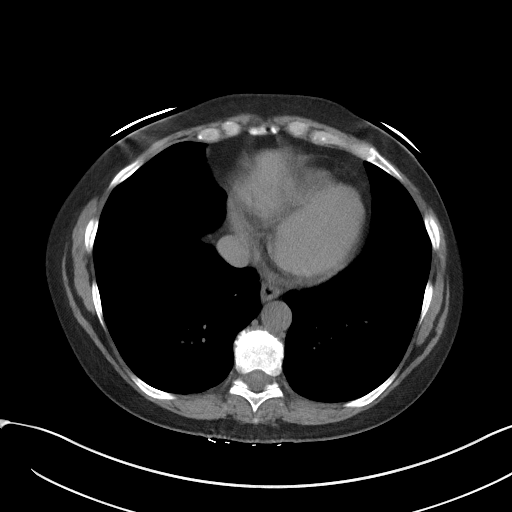

[Series 5: coronal · coronal · 0.80mm/px · 3 of 148 slices shown]
[im 50/148  soft-tissue]
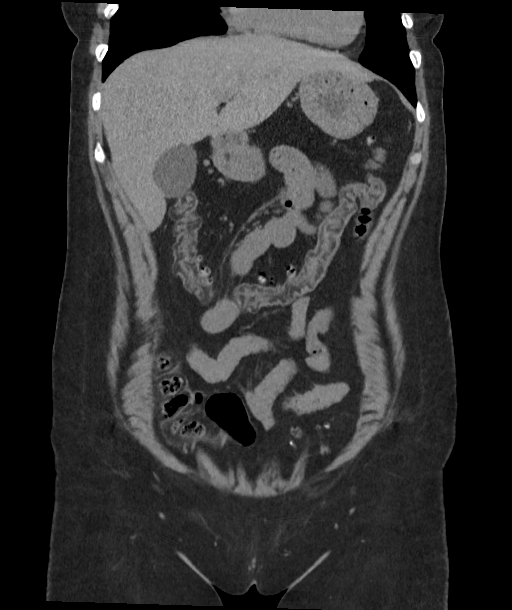
[im 66/148  soft-tissue]
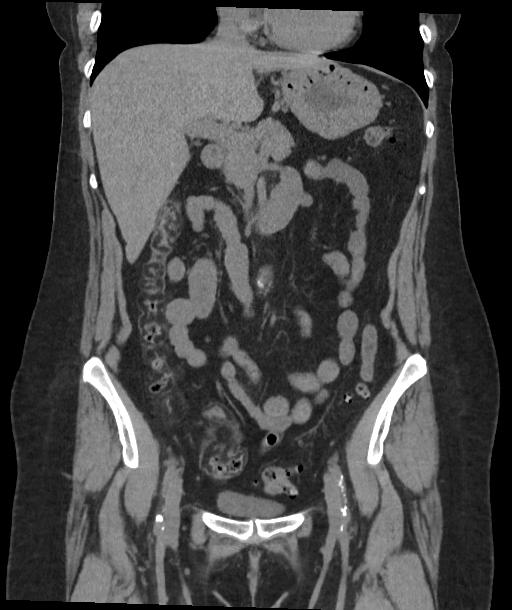
[im 82/148  soft-tissue]
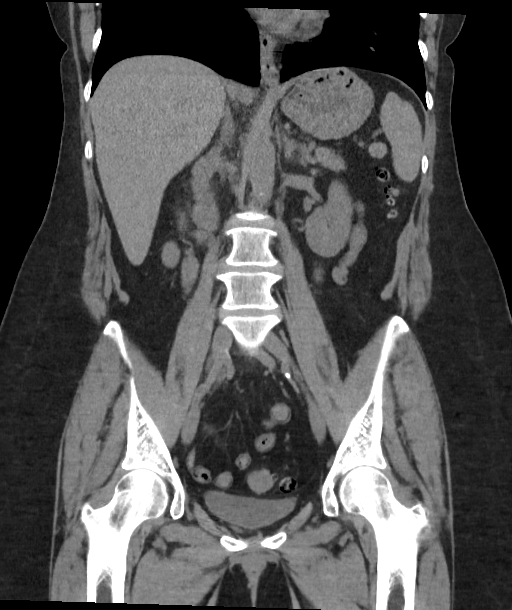

[16 of 46 positions shown; findings below may reference images not displayed]

FINDINGS: Lower chest: Lung bases are clear.

Hepatobiliary: Unenhanced liver is unremarkable.

Gallbladder is unremarkable. No intrahepatic or extrahepatic ductal
dilatation.

Pancreas: Within normal limits.

Spleen: Within normal limits.

Adrenals/Urinary Tract: Adrenal glands are within normal limits.

2.6 cm medial left upper pole renal cyst (series 2/image 24).
Multiple nonobstructing renal calculi bilaterally, measuring 1-3 mm,
including a 3 mm right upper pole calculus (coronal image 101) and 2
punctate left lower pole renal calculi (coronal image 92).

Punctate distal right ureteral calculus at the UVJ (series 2/image
80). No hydronephrosis.

Bladder is within limits.

Stomach/Bowel: Stomach is within normal limits.

No evidence of bowel obstruction.

Appendix is not discretely visualized.

Mild wall thickening involving the ascending/proximal transverse
colon, with submucosal fat deposition, likely chronic without acute
inflammatory changes. Scattered colonic diverticulosis, without
evidence of acute diverticulitis.

Vascular/Lymphatic: No evidence of abdominal aortic aneurysm.

Atherosclerotic calcifications of the abdominal aorta and branch
vessels.

No suspicious abdominopelvic lymphadenopathy.

Reproductive: Status post hysterectomy.

No adnexal masses.

Other: No abdominopelvic ascites.

Musculoskeletal: Visualized osseous structures are within normal
limits.
IMPRESSION: Punctate distal right ureteral calculus at the UVJ. No
hydronephrosis.

Additional nonobstructing bilateral renal calculi, measuring up to 3
mm in the right upper pole.

Additional ancillary findings as above.

## 2022-04-24 ENCOUNTER — Other Ambulatory Visit: Payer: Self-pay | Admitting: Internal Medicine

## 2022-04-24 DIAGNOSIS — F419 Anxiety disorder, unspecified: Secondary | ICD-10-CM

## 2022-04-26 NOTE — Telephone Encounter (Signed)
Refilled 04/13/22 #90. Requested Prescriptions  Refused Prescriptions Disp Refills   escitalopram (LEXAPRO) 5 MG tablet [Pharmacy Med Name: ESCITALOPRAM 5 MG TABLET] 90 tablet 0    Sig: TAKE 1 TABLET (5 MG TOTAL) BY MOUTH DAILY.     Psychiatry:  Antidepressants - SSRI Passed - 04/24/2022  9:15 AM      Passed - Completed PHQ-2 or PHQ-9 in the last 360 days      Passed - Valid encounter within last 6 months    Recent Outpatient Visits           2 months ago Left lower quadrant abdominal pain   [facility] Berniece Salines, FNP   4 months ago Hospital discharge follow-up   [facility] Margarita Mail, DO   4 months ago Elevated troponin   [facility] Margarita Mail, DO   5 months ago Chronic allergic rhinitis   [facility] Margarita Mail, DO   9 months ago Upper respiratory tract infection, unspecified type   [facility] Mecum, Oswaldo Conroy, PA-C       Future Appointments             In 1 week Neale Burly, [place], MD [facility]

## 2022-04-27 ENCOUNTER — Encounter: Payer: Self-pay | Admitting: Physician Assistant

## 2022-04-27 ENCOUNTER — Ambulatory Visit: Payer: Managed Care, Other (non HMO) | Admitting: Physician Assistant

## 2022-04-27 ENCOUNTER — Ambulatory Visit: Payer: Self-pay

## 2022-04-27 VITALS — BP 112/82 | HR 92 | Temp 97.8°F | Resp 16 | Ht 68.0 in | Wt 177.9 lb

## 2022-04-27 DIAGNOSIS — M546 Pain in thoracic spine: Secondary | ICD-10-CM

## 2022-04-27 DIAGNOSIS — K219 Gastro-esophageal reflux disease without esophagitis: Secondary | ICD-10-CM

## 2022-04-27 DIAGNOSIS — Z720 Tobacco use: Secondary | ICD-10-CM | POA: Insufficient documentation

## 2022-04-27 MED ORDER — PANTOPRAZOLE SODIUM 40 MG PO TBEC: 40.0000 mg | DELAYED_RELEASE_TABLET | Freq: Two times a day (BID) | ORAL | 1 refills | Status: DC

## 2022-04-27 MED ORDER — DICLOFENAC SODIUM 75 MG PO TBEC: 75.0000 mg | DELAYED_RELEASE_TABLET | Freq: Two times a day (BID) | ORAL | 0 refills | Status: DC

## 2022-04-27 NOTE — Progress Notes (Signed)
Acute Office Visit   Patient: Brianna Huff   DOB: 1965/08/02   57 y.o. Female  MRN: [id number] Visit Date: 04/27/2022  Today's healthcare provider: Oswaldo Conroy Zymarion Favorite, PA-C  Introduced myself to the patient as a Secondary school teacher and provided education on APPs in clinical practice.    Chief Complaint  Patient presents with   Back Pain    Thoracic - left sided pain with deep inspiration    Subjective    HPI HPI     Back Pain    Additional comments: Thoracic - left sided pain with deep inspiration       Last edited by Providence Crosby, PA-C on 04/27/2022  9:01 AM.        Left side and thoracic back pain   Onset: sudden  Duration: ongoing for one day  Location: along left side of trunk and into left thoracic back  She denies any injuries or trauma to the area that she is aware of  Radiation: none  Pain level and character:  7-8/10 sharp with deep inspiration  Denies dysuria, hematuria, abdominal pain, nausea, vomiting, diarrhea, constipation, chest pain, jaw pain, arm pain,   Other associated symptoms:She does a lot of repetitive motions for work - she reports some lower back pain for the past 2 weeks  Interventions: milk of magnesia, aleve liquid gels Alleviating: bending forward, standing  She reports she is able to lean forward and pick up her 18 lb dog without pain  Aggravating: taking a deep breath   Patient had CT abdomen in Feb 2024 which demonstrated extensive diverticulitis - this was treated with abx    Medications: Outpatient Medications Prior to Visit  Medication Sig   atorvastatin (LIPITOR) 40 MG tablet Take 1 tablet (40 mg total) by mouth daily.   Azelastine HCl 137 MCG/SPRAY SOLN PLACE 1 SPRAY INTO BOTH NOSTRILS 2 (TWO) TIMES DAILY. USE IN EACH NOSTRIL AS DIRECTED   cetirizine (ZYRTEC) 10 MG tablet Take 1 tablet (10 mg total) by mouth daily.   escitalopram (LEXAPRO) 5 MG tablet TAKE 1 TABLET (5 MG TOTAL) BY MOUTH DAILY.   fluticasone (FLONASE) 50 MCG/ACT  nasal spray Place 2 sprays into both nostrils daily. (Patient taking differently: Place 2 sprays into both nostrils daily. OTC)   ketoconazole (NIZORAL) 2 % shampoo Apply 1 application topically See admin instructions. apply three times per week, massage into scalp and leave in for 10 minutes before rinsing out   triamcinolone lotion (KENALOG) 0.1 % APPLY TO ITCHY AREAS OF SCALP AND EARS TWICE A DAY . CAN USE FOR UP TO 2 WEEKS AT EARS.AVOID APPLYING TO FACE, GROIN, AND AXILLA. USE AS DIRECTED.   [DISCONTINUED] pantoprazole (PROTONIX) 40 MG tablet Take 1 tablet (40 mg total) by mouth 2 (two) times daily.   No facility-administered medications prior to visit.    Review of Systems  Constitutional:  Negative for chills and fever.  Respiratory:  Negative for shortness of breath and wheezing.   Cardiovascular:  Negative for chest pain, palpitations and leg swelling.  Gastrointestinal:  Negative for abdominal distention, abdominal pain, blood in stool, constipation, diarrhea, nausea and vomiting.  Genitourinary:  Negative for difficulty urinating, dysuria, flank pain, frequency, hematuria and urgency.  Musculoskeletal:  Positive for back pain and myalgias.  Skin:  Negative for color change, rash and wound.       Objective    BP 112/82 (BP Location: Right Arm, Patient Position: Sitting, Cuff Size:  Normal)   Pulse 92   Temp 97.8 F (36.6 C) (Oral)   Resp 16   Ht  (1.727 m) Comment: per patient  Wt 177 lb 14.4 oz (80.7 kg)   SpO2 98%   BMI 27.05 kg/m    Physical Exam Vitals reviewed.  Constitutional:      General: She is awake.     Appearance: Normal appearance. She is well-developed and well-groomed.  HENT:     Head: Normocephalic and atraumatic.  Abdominal:     General: Abdomen is flat. Bowel sounds are normal.     Palpations: Abdomen is soft. There is no hepatomegaly or splenomegaly.     Tenderness: There is right CVA tenderness and left CVA tenderness. There is no guarding  or rebound. Negative signs include Murphy's sign, McBurney's sign and obturator sign.  Musculoskeletal:     Cervical back: Normal range of motion and neck supple. No swelling, edema, spasms, torticollis, tenderness or crepitus. No pain with movement. Normal range of motion.     Thoracic back: Spasms and tenderness present. No swelling, signs of trauma or bony tenderness. Normal range of motion.     Lumbar back: Normal range of motion. Negative right straight leg raise test and negative left straight leg raise test.       Back:     Comments: Back ROM Mild pain reported with lateral rotation and lateral flexion on both sides Extension and forward flexion are normal - mild lower back pain with extension    Skin:    General: Skin is warm and dry.  Neurological:     General: No focal deficit present.     Mental Status: She is alert and oriented to person, place, and time. Mental status is at baseline.     GCS: GCS eye subscore is 4. GCS verbal subscore is 5. GCS motor subscore is 6.     Cranial Nerves: Cranial nerves 2-12 are intact.     Motor: Motor function is intact.     Coordination: Coordination is intact.  Psychiatric:        Attention and Perception: Attention normal.        Mood and Affect: Mood and affect normal.        Behavior: Behavior is cooperative.       No results found for any visits on 04/27/22.  Assessment & Plan      No follow-ups on file.       Problem List Items Addressed This Visit       Digestive   Gastroesophageal reflux disease Refill provided per patient request    Relevant Medications   pantoprazole (PROTONIX) 40 MG tablet   Other Visit Diagnoses     Acute left-sided thoracic back pain    -  Primary Acute, new concern Reports left-sided thoracic back and side pain for about a day that is not improved with Aleve  Given PE, I suspect muscular issue is likely causing symptoms Reviewed conservative measures with her- warm compresses, gentle  stretching and massage, NSAIDs and Tylenol PRN for pain Will provide Diclofenac 75 mg PO BID PRN for pain instead of OTC NSAIDs  Reviewed return precautions- If not improving in about 4 weeks she should return and we may need to order imaging and refer to PT for management Follow up as needed for persistent or progressing symptoms     Relevant Medications   diclofenac (VOLTAREN) 75 MG EC tablet        No follow-ups  on file.   I, Kianah Harries E Fillmore Bynum, PA-C, have reviewed all documentation for this visit. The documentation on 04/27/22 for the exam, diagnosis, procedures, and orders are all accurate and complete.   Jacquelin Hawking, MHS, PA-C [facility]

## 2022-04-27 NOTE — Patient Instructions (Addendum)
Based on your symptoms and physical exam I believe the following is the cause of your concern today  Back pain likely secondary to a strain of your back muscles  I recommend the following at this time to help relieve that discomfort:   Rest Warm compresses to the area (20 minutes on, minimum of 30 minutes off) You can alternate Tylenol and NSAIDs for pain management but NSAIDs is typically preferred to reduce inflammation.  I have sent in a script for Diclofenac 75 mg to be taken by mouth twice per day - please take this with food  You should not take any other NSAIDs while using the Diclofenac You can take up to 3500 mg of Tylenol every 24 hours in divided doses- think 600 mg every 8 hours.  Gentle stretches and exercises that I have included in your paperwork Try to reduce excess strain to the area and rest as much as possible  Wear supportive shoes and, if you must lift anything, use proper lifting techniques that spare your back.   If these measures do not lead to improvement in your symptoms over the next 2-4 weeks please let us know

## 2022-04-27 NOTE — Telephone Encounter (Signed)
     Chief Complaint: Left side of abdomen pain, hurts in back and groin as well. Pain 7-8/10. Symptoms: Above Frequency: Yesterday Pertinent Negatives: Patient denies fever Disposition: ED /[] Urgent Care (no appt availability in office) / Appointment(In office/virtual)/  [place] [facility]/ Home Care/ Refused Recommended Disposition /[] [place] [facility]/  Follow-up with PCP Additional Notes: Pt. Already has appointment for today.  Answer Assessment - Initial Assessment Questions 1. LOCATION: "Where does it hurt?"      Left side at waist with groin pain 2. RADIATION: "Does the pain shoot anywhere else?" (e.g., chest, back)     Back 3. ONSET: "When did the pain begin?" (e.g., minutes, hours or days ago)      Yesterday 4. SUDDEN: "Gradual or sudden onset?"     Gradual 5. PATTERN "Does the pain come and go, or is it constant?"    - If it comes and goes: "How long does it last?" "Do you have pain now?"     (Note: Comes and goes means the pain is intermittent. It goes away completely between bouts.)    - If constant: "Is it getting better, staying the same, or getting worse?"      (Note: Constant means the pain never goes away completely; most serious pain is constant and gets worse.)      Comes and goes 6. SEVERITY: "How bad is the pain?"  (e.g., Scale 1-10; mild, moderate, or severe)    - MILD (1-3): Doesn't interfere with normal activities, abdomen soft and not tender to touch.     - MODERATE (4-7): Interferes with normal activities or awakens from sleep, abdomen tender to touch.     - SEVERE (8-10): Excruciating pain, doubled over, unable to do any normal activities.       Worse 7-8 7. RECURRENT SYMPTOM: "Have you ever had this type of stomach pain before?" If Yes, ask: "When was the last time?" and "What happened that time?"      This is different 8. CAUSE: "What do you think is causing the stomach pain?"     Unsure 9. RELIEVING/AGGRAVATING FACTORS: "What  makes it better or worse?" (e.g., antacids, bending or twisting motion, bowel movement)     No 10. OTHER SYMPTOMS: "Do you have any other symptoms?" (e.g., back pain, diarrhea, fever, urination pain, vomiting)       No 11. PREGNANCY: "Is there any chance you are pregnant?" "When was your last menstrual period?"       No  Protocols used: Abdominal Pain - [facility]

## 2022-04-30 ENCOUNTER — Telehealth: Payer: Self-pay | Admitting: Internal Medicine

## 2022-04-30 ENCOUNTER — Other Ambulatory Visit: Payer: Self-pay | Admitting: Internal Medicine

## 2022-04-30 DIAGNOSIS — K219 Gastro-esophageal reflux disease without esophagitis: Secondary | ICD-10-CM

## 2022-04-30 MED ORDER — OMEPRAZOLE 40 MG PO CPDR: 40.0000 mg | DELAYED_RELEASE_CAPSULE | Freq: Every day | ORAL | 0 refills | Status: DC

## 2022-04-30 NOTE — Telephone Encounter (Signed)
Pt called in about refill of pantoprazole (PROTONIX) 40 MG tablet . She states insurance is not covering this. Are there alternatives? Please call back ot discuss

## 2022-05-05 ENCOUNTER — Ambulatory Visit: Payer: Managed Care, Other (non HMO) | Admitting: Dermatology

## 2022-05-14 ENCOUNTER — Ambulatory Visit: Payer: Self-pay | Admitting: *Deleted

## 2022-05-14 ENCOUNTER — Encounter: Payer: Self-pay | Admitting: Internal Medicine

## 2022-05-14 ENCOUNTER — Ambulatory Visit: Payer: Managed Care, Other (non HMO) | Admitting: Internal Medicine

## 2022-05-14 VITALS — BP 116/84 | HR 118 | Temp 98.0°F | Resp 18 | Ht 68.0 in | Wt 179.9 lb

## 2022-05-14 DIAGNOSIS — M545 Low back pain, unspecified: Secondary | ICD-10-CM | POA: Diagnosis not present

## 2022-05-14 DIAGNOSIS — Z1231 Encounter for screening mammogram for malignant neoplasm of breast: Secondary | ICD-10-CM | POA: Diagnosis not present

## 2022-05-14 LAB — POCT URINALYSIS DIPSTICK
Bilirubin, UA: NEGATIVE
Blood, UA: NEGATIVE
Glucose, UA: NEGATIVE
Ketones, UA: NEGATIVE
Leukocytes, UA: NEGATIVE
Nitrite, UA: NEGATIVE
Odor: NORMAL
Protein, UA: NEGATIVE
Spec Grav, UA: 1.025 (ref 1.010–1.025)
Urobilinogen, UA: 0.2 E.U./dL
pH, UA: 5.5 (ref 5.0–8.0)

## 2022-05-14 MED ORDER — TIZANIDINE HCL 4 MG PO TABS: 4.0000 mg | ORAL_TABLET | Freq: Every evening | ORAL | 0 refills | Status: DC | PRN

## 2022-05-14 MED ORDER — LIDOCAINE 4 % EX PTCH: 1.0000 | MEDICATED_PATCH | CUTANEOUS | 1 refills | Status: DC

## 2022-05-14 MED ORDER — NAPROXEN 500 MG PO TABS: 500.0000 mg | ORAL_TABLET | Freq: Two times a day (BID) | ORAL | 0 refills | Status: AC

## 2022-05-14 NOTE — Progress Notes (Signed)
Acute Office Visit  Subjective:     Patient ID: Brianna Huff, female    DOB: [birth year], 58 y.o.   MRN: [id number]  Chief Complaint  Patient presents with   Back Pain    Lower right    HPI Patient is in today for back pain.  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  Musculoskeletal:  Positive for back pain.        Objective:    BP 116/84   Pulse (!) 118   Temp 98 F (36.7 C)   Resp 18   Ht 5\' 8"  (1.727 m)   Wt 179 lb 14.4 oz (81.6 kg)   SpO2 98%   BMI 27.35 kg/m  BP Readings from Last 3 Encounters:  05/14/22 116/84  04/27/22 112/82  02/05/22 116/70   Wt Readings from Last 3 Encounters:  05/14/22 179 lb 14.4 oz (81.6 kg)  04/27/22 177 lb 14.4 oz (80.7 kg)  02/05/22 181 lb 4.8 oz (82.2 kg)      Physical Exam Constitutional:      Appearance: Normal appearance.  HENT:     Head: Normocephalic and atraumatic.  Eyes:     Conjunctiva/sclera: Conjunctivae normal.  Cardiovascular:     Rate and Rhythm: Normal rate and regular rhythm.  Pulmonary:     Effort: Pulmonary effort is normal.     Breath sounds: Normal breath sounds.  Musculoskeletal:        General: Tenderness present. No swelling.     Comments: Hypertonicity in the paraspinal muscles from T9-L2 on the right  Skin:    General: Skin is warm and dry.  Neurological:      General: No focal deficit present.     Mental Status: She is alert. Mental status is at baseline.  Psychiatric:        Mood and Affect: Mood normal.        Behavior: Behavior normal.     No results found for any visits on 05/14/22.      Assessment & Plan:   1. Acute right-sided low back pain without sciatica: Symptoms and physical exam consistent with acute right-sided lumbar strain.  UA in office negative.  Will treat with naproxen 500 mg twice a day for 2 weeks as well as muscle relaxers as needed.  Patient can also use heat and topical medications like Bengay/IcyHot.  Will prescribe lidocaine patches to use while at work.  Discussed how pulling mechanism with right hand at work is exacerbating symptoms, patient will talk to her job about a less intensive position for the next 2 weeks.  Patient will follow-up if symptoms worsen or fail to improve.  - POCT urinalysis dipstick - lidocaine (HM LIDOCAINE PATCH) 4 %; Place 1 patch onto the skin daily.  Dispense: 30 patch; Refill: 1 - naproxen (NAPROSYN) 500 MG tablet; Take 1 tablet (500 mg total) by mouth 2 (two) times daily with a meal for 14 days.  Dispense: 28 tablet; Refill: 0 - tiZANidine (ZANAFLEX) 4 MG tablet; Take 1 tablet (4 mg total) by mouth at bedtime as needed for muscle spasms.  Dispense: 30 tablet; Refill: 0  2. Encounter for screening mammogram for malignant neoplasm of breast: Mammogram ordered.  - MM 3D SCREENING MAMMOGRAM BILATERAL BREAST; Future  Return in about 7 months (around 12/14/2022).  Margarita Mail, DO

## 2022-05-14 NOTE — Telephone Encounter (Signed)
  Chief Complaint: Right lower back pain for a month but worse over the last week. Symptoms: above Frequency: Above Pertinent Negatives: Patient denies pain down either leg or urinary symptoms Disposition: [] ED /[] Urgent Care (no appt availability in office) / [x] Appointment(In office/virtual)/ []  [place] [facility]/ [] Home Care/ [] Refused Recommended Disposition /[] [place] [facility]/ []  Follow-up with PCP Additional Notes: Appt. Made with Dr. Caralee Ates for today at 2:00.

## 2022-05-14 NOTE — Telephone Encounter (Signed)
Reason for Disposition  [1] SEVERE back pain (e.g., excruciating, unable to do any normal activities) AND [2] not improved 2 hours after pain medicine  Answer Assessment - Initial Assessment Questions 1. ONSET: "When did the pain begin?"      I'm having back pain for a month.   This past week it's getting worse.    I can barely move this morning.   2. LOCATION: "Where does it hurt?" (upper, mid or lower back)     Lower back on right side. 3. SEVERITY: "How bad is the pain?"  (e.g., Scale 1-10; mild, moderate, or severe)   - MILD (1-3): Doesn't interfere with normal activities.    - MODERATE (4-7): Interferes with normal activities or awakens from sleep.    - SEVERE (8-10): Excruciating pain, unable to do any normal activities.      Severe in right lower back. 4. PATTERN: "Is the pain constant?" (e.g., yes, no; constant, intermittent)      Constant 5. RADIATION: "Does the pain shoot into your legs or somewhere else?"     No 6. CAUSE:  "What do you think is causing the back pain?"      I don't know 7. BACK OVERUSE:  "Any recent lifting of heavy objects, strenuous work or exercise?"     Not asked 8. MEDICINES: "What have you taken so far for the pain?" (e.g., nothing, acetaminophen, NSAIDS)     Ibuprofen not helping.    I was in office 2 weeks ago for pain in right side. 9. NEUROLOGIC SYMPTOMS: "Do you have any weakness, numbness, or problems with bowel/bladder control?"     No 10. OTHER SYMPTOMS: "Do you have any other symptoms?" (e.g., fever, abdomen pain, burning with urination, blood in urine)       no 11. PREGNANCY: "Is there any chance you are pregnant?" "When was your last menstrual period?"       N/A  Protocols used: Back Pain-A-AH

## 2022-05-14 NOTE — Patient Instructions (Addendum)
It was great seeing you today!  Plan discussed at today's visit: -Take Naproxen 500 mg twice a day with food, avoid all other anti-inflammatories -Take muscle relaxer at night -Try to avoid repetitive motions that triggered pain -Can use lidocaine patches as needed -Can also use topicals like Voltaren, IcyHot, BenGay, etc.   Follow up in: 7 months for regular follow up  Take care and let us know if you have any questions or concerns prior to your next visit.  Dr. Caralee Ates

## 2022-05-16 ENCOUNTER — Other Ambulatory Visit: Payer: Self-pay | Admitting: Internal Medicine

## 2022-05-16 DIAGNOSIS — J309 Allergic rhinitis, unspecified: Secondary | ICD-10-CM

## 2022-05-17 NOTE — Telephone Encounter (Signed)
Requested Prescriptions  Pending Prescriptions Disp Refills   Azelastine HCl 137 MCG/SPRAY SOLN [Pharmacy Med Name: AZELASTINE 0.1% (137 MCG) SPRY] 90 mL 0    Sig: PLACE 1 SPRAY INTO BOTH NOSTRILS 2 (TWO) TIMES DAILY. USE IN EACH NOSTRIL AS DIRECTED     Ear, Nose, and Throat: Nasal Preparations - Antiallergy Passed - 05/16/2022  9:22 AM      Passed - Valid encounter within last 12 months    Recent Outpatient Visits           3 days ago Acute right-sided low back pain without sciatica   [facility] Margarita Mail, DO   2 weeks ago Acute left-sided thoracic back pain   [facility] Mecum, Oswaldo Conroy, PA-C   3 months ago Left lower quadrant abdominal pain   [facility] Berniece Salines, FNP   4 months ago Hospital discharge follow-up   [facility] Margarita Mail, DO   5 months ago Elevated troponin   [facility] Margarita Mail, DO       Future Appointments             In 7 months Margarita Mail, DO [facility], [facility]

## 2022-06-01 ENCOUNTER — Other Ambulatory Visit: Payer: Self-pay | Admitting: Internal Medicine

## 2022-06-01 ENCOUNTER — Ambulatory Visit: Payer: Self-pay | Admitting: *Deleted

## 2022-06-01 DIAGNOSIS — M545 Low back pain, unspecified: Secondary | ICD-10-CM

## 2022-06-01 MED ORDER — NAPROXEN 500 MG PO TABS
500.0000 mg | ORAL_TABLET | Freq: Every day | ORAL | 0 refills | Status: AC | PRN
Start: 2022-06-01 — End: ?

## 2022-06-01 NOTE — Telephone Encounter (Signed)
Attempted to return her call.   Left a voicemail to call back to discuss symptoms with a nurse. 

## 2022-06-01 NOTE — Telephone Encounter (Signed)
  Chief Complaint: Back pain Symptoms: Seen 05/14/22. States has completed anti-inflammatories, taking flexeril at HS only. States no worsening, "Better for a couple days, now back." 10/10 with certain movements.  Frequency: OV 05/14/22 Pertinent Negatives: Patient denies numbness, radiation Disposition: [] ED /[] Urgent Care (no appt availability in office) / [] Appointment(In office/virtual)/ []  [place] [facility]/ [] Home Care/ [] Refused Recommended Disposition /[] [place] [facility]/ [x]  Follow-up with PCP Additional Notes: Please advise if pt needs appt. States "I think it's because I'm out of the Naprosyn." States Aleve not helping. Advised ED for worsening symptoms. Assured pt NT would route to practice for PCPs review. Reason for Disposition  Back pain present > 2 weeks  Answer Assessment - Initial Assessment Questions 1. ONSET: "When did the pain begin?"      Seen 05/14/22 2. LOCATION: "Where does it hurt?" (upper, mid or lower back)     Lower 3. SEVERITY: "How bad is the pain?"  (e.g., Scale 1-10; mild, moderate, or severe)   - MILD (1-3): Doesn't interfere with normal activities.    - MODERATE (4-7): Interferes with normal activities or awakens from sleep.    - SEVERE (8-10): Excruciating pain, unable to do any normal activities.      10/10 with certain movements. 4. PATTERN: "Is the pain constant?" (e.g., yes, no; constant, intermittent)      Constant 5. RADIATION: "Does the pain shoot into your legs or somewhere else?"     no 6. CAUSE:  "What do you think is causing the back pain?"       7. BACK OVERUSE:  "Any recent lifting of heavy objects, strenuous work or exercise?"     8. MEDICINES: "What have you taken so far for the pain?" (e.g., nothing, acetaminophen, NSAIDS)      9. NEUROLOGIC SYMPTOMS: "Do you have any weakness, numbness, or problems with bowel/bladder control?"     no 10. OTHER SYMPTOMS: "Do you have any other symptoms?" (e.g., fever, abdomen pain, burning  with urination, blood in urine)       More relief when standing than sitting.  Protocols used: Back Pain-A-AH

## 2022-06-01 NOTE — Telephone Encounter (Signed)
Message from Valora Piccolo sent at 06/01/2022  8:28 AM EDT  Summary: Back Pain Advice   Pt is calling to report that she was seen on 05/14/22 for Back Pain. Pt has taken all of her inflammatory medications. Patient would like to know what she can do? Pleas advise          Call History   Type Contact Phone/Fax User  06/01/2022 08:27 AM EDT Phone (Incoming) Verenice, Arab (Self) [phone] Rexene Edison) Valora Piccolo

## 2022-06-01 NOTE — Telephone Encounter (Signed)
Pt.notified

## 2022-06-05 ENCOUNTER — Other Ambulatory Visit: Payer: Self-pay | Admitting: Internal Medicine

## 2022-06-05 DIAGNOSIS — M545 Low back pain, unspecified: Secondary | ICD-10-CM

## 2022-06-07 NOTE — Telephone Encounter (Signed)
Requested medication (s) are due for refill today: routing for review  Requested medication (s) are on the active medication list: yes  Last refill:  05/14/22  Future visit scheduled: yes  Notes to clinic:  Unable to refill per protocol, cannot delegate.      Requested Prescriptions  Pending Prescriptions Disp Refills   tiZANidine (ZANAFLEX) 4 MG tablet [Pharmacy Med Name: TIZANIDINE HCL 4 MG TABLET] 90 tablet 1    Sig: TAKE 1 TABLET BY MOUTH AT BEDTIME AS NEEDED FOR MUSCLE SPASMS.     Not Delegated - Cardiovascular:  Alpha-2 Agonists - tizanidine Failed - 06/05/2022  8:31 AM      Failed - This refill cannot be delegated      Passed - Valid encounter within last 6 months    Recent Outpatient Visits           3 weeks ago Acute right-sided low back pain without sciatica   [facility] Margarita Mail, DO   1 month ago Acute left-sided thoracic back pain   [facility] Mecum, Oswaldo Conroy, PA-C   4 months ago Left lower quadrant abdominal pain   [facility] Berniece Salines, FNP   5 months ago Hospital discharge follow-up   [facility] Margarita Mail, DO   5 months ago Elevated troponin   [facility] Margarita Mail, DO       Future Appointments             In 6 months Margarita Mail, DO [facility], [facility]

## 2022-06-29 ENCOUNTER — Ambulatory Visit
Admission: RE | Admit: 2022-06-29 | Discharge: 2022-06-29 | Disposition: A | Discharge status: Home / Self Care | Payer: Managed Care, Other (non HMO) | Source: Ambulatory Visit | Attending: Internal Medicine | Admitting: Internal Medicine

## 2022-06-29 DIAGNOSIS — Z1231 Encounter for screening mammogram for malignant neoplasm of breast: Secondary | ICD-10-CM | POA: Insufficient documentation

## 2022-07-11 ENCOUNTER — Other Ambulatory Visit: Payer: Self-pay | Admitting: Internal Medicine

## 2022-07-11 DIAGNOSIS — E782 Mixed hyperlipidemia: Secondary | ICD-10-CM

## 2022-07-12 NOTE — Telephone Encounter (Signed)
Requested Prescriptions  Pending Prescriptions Disp Refills   atorvastatin (LIPITOR) 40 MG tablet [Pharmacy Med Name: ATORVASTATIN 40 MG TABLET] 90 tablet 1    Sig: TAKE 1 TABLET BY MOUTH EVERY DAY     Cardiovascular:  Antilipid - Statins Failed - 07/11/2022  8:46 AM      Failed - Lipid Panel in normal range within the last 12 months    Cholesterol  Date Value Ref Range Status  12/16/2021 227 (H) 0 - 200 mg/dL Final   LDL Cholesterol (Calc)  Date Value Ref Range Status  04/17/2021 125 (H) mg/dL (calc) Final    Comment:    Reference range: <100 . Desirable range <100 mg/dL for primary prevention;   <70 mg/dL for patients with CHD or diabetic patients  with > or = 2 CHD risk factors. Marland Kitchen LDL-C is now calculated using the Martin-Hopkins  calculation, which is a validated novel method providing  better accuracy than the Friedewald equation in the  estimation of LDL-C.  Horald Pollen et al. Lenox Ahr. 1610;960(45): 2061-2068  (http://education.[employer].com/faq/FAQ164)    LDL Cholesterol  Date Value Ref Range Status  12/16/2021 125 (H) 0 - 99 mg/dL Final    Comment:           Total Cholesterol/HDL:CHD Risk Coronary Heart Disease Risk Table                     Men   Women  1/2 Average Risk   3.4   3.3  Average Risk       5.0   4.4  2 X Average Risk   9.6   7.1  3 X Average Risk  23.4   11.0        Use the calculated Patient Ratio above and the CHD Risk Table to determine the patient's CHD Risk.        ATP III CLASSIFICATION (LDL):  <100     mg/dL   Optimal  409-811  mg/dL   Near or Above                    Optimal  130-159  mg/dL   Borderline  914-782  mg/dL   High  >956     mg/dL   Very High Performed at [facility], [address] [place], [place] [address]    HDL  Date Value Ref Range Status  12/16/2021 47 >40 mg/dL Final   Triglycerides  Date Value Ref Range Status  12/16/2021 273 (H) <150 mg/dL Final         Passed - Patient is not pregnant       Passed - Valid encounter within last 12 months    Recent Outpatient Visits           1 month ago Acute right-sided low back pain without sciatica   [facility] Margarita Mail, DO   2 months ago Acute left-sided thoracic back pain   [facility] Mecum, Oswaldo Conroy, PA-C   5 months ago Left lower quadrant abdominal pain   [facility] Berniece Salines, FNP   6 months ago Hospital discharge follow-up   [facility] Margarita Mail, DO   7 months ago Elevated troponin   [facility] Margarita Mail, DO       Future Appointments             In 5 months Caralee Ates,  Gentry Fitz, DO [facility], [facility]

## 2022-07-14 ENCOUNTER — Other Ambulatory Visit: Payer: Self-pay | Admitting: Internal Medicine

## 2022-07-14 DIAGNOSIS — F419 Anxiety disorder, unspecified: Secondary | ICD-10-CM

## 2022-07-15 NOTE — Telephone Encounter (Signed)
Requested Prescriptions  Pending Prescriptions Disp Refills   escitalopram (LEXAPRO) 5 MG tablet [Pharmacy Med Name: ESCITALOPRAM 5 MG TABLET] 90 tablet 1    Sig: TAKE 1 TABLET (5 MG TOTAL) BY MOUTH DAILY.     Psychiatry:  Antidepressants - SSRI Passed - 07/14/2022  3:27 PM      Passed - Completed PHQ-2 or PHQ-9 in the last 360 days      Passed - Valid encounter within last 6 months    Recent Outpatient Visits           2 months ago Acute right-sided low back pain without sciatica   [facility] Margarita Mail, DO   2 months ago Acute left-sided thoracic back pain   [facility] Mecum, Oswaldo Conroy, PA-C   5 months ago Left lower quadrant abdominal pain   [facility] Berniece Salines, FNP   6 months ago Hospital discharge follow-up   [facility] Margarita Mail, DO   7 months ago Elevated troponin   [facility] Margarita Mail, DO       Future Appointments             In 5 months Margarita Mail, DO [facility], [facility]

## 2022-10-04 ENCOUNTER — Ambulatory Visit: Payer: Self-pay
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
## 2022-10-11 ENCOUNTER — Other Ambulatory Visit: Payer: Self-pay | Admitting: Internal Medicine

## 2022-10-11 DIAGNOSIS — E782 Mixed hyperlipidemia: Secondary | ICD-10-CM

## 2022-10-12 NOTE — Telephone Encounter (Signed)
Requested Prescriptions  Pending Prescriptions Disp Refills   atorvastatin (LIPITOR) 40 MG tablet [Pharmacy Med Name: ATORVASTATIN 40 MG TABLET] 90 tablet 0    Sig: TAKE 1 TABLET BY MOUTH EVERY DAY     Cardiovascular:  Antilipid - Statins Failed - 10/11/2022  3:51 PM      Failed - Lipid Panel in normal range within the last 12 months    Cholesterol  Date Value Ref Range Status  12/16/2021 227 (H) 0 - 200 mg/dL Final   LDL Cholesterol (Calc)  Date Value Ref Range Status  04/17/2021 125 (H) mg/dL (calc) Final    Comment:    Reference range: <100 . Desirable range <100 mg/dL for primary prevention;   <70 mg/dL for patients with CHD or diabetic patients  with > or = 2 CHD risk factors. Marland Kitchen LDL-C is now calculated using the Martin-Hopkins  calculation, which is a validated novel method providing  better accuracy than the Friedewald equation in the  estimation of LDL-C.  Horald Pollen et al. Lenox Ahr. 8295;621(30): 2061-2068  (http://education.[facility].com/faq/FAQ164)    LDL Cholesterol  Date Value Ref Range Status  12/16/2021 125 (H) 0 - 99 mg/dL Final    Comment:           Total Cholesterol/HDL:CHD Risk Coronary Heart Disease Risk Table                     Men   Women  1/2 Average Risk   3.4   3.3  Average Risk       5.0   4.4  2 X Average Risk   9.6   7.1  3 X Average Risk  23.4   11.0        Use the calculated Patient Ratio above and the CHD Risk Table to determine the patient's CHD Risk.        ATP III CLASSIFICATION (LDL):  <100     mg/dL   Optimal  865-784  mg/dL   Near or Above                    Optimal  130-159  mg/dL   Borderline  696-295  mg/dL   High  >284     mg/dL   Very High Performed at [facility], [address] [place], [place] [address]    HDL  Date Value Ref Range Status  12/16/2021 47 >40 mg/dL Final   Triglycerides  Date Value Ref Range Status  12/16/2021 273 (H) <150 mg/dL Final         Passed - Patient is not pregnant       Passed - Valid encounter within last 12 months    Recent Outpatient Visits           5 months ago Acute right-sided low back pain without sciatica   [facility] Margarita Mail, DO   5 months ago Acute left-sided thoracic back pain   [facility] Mecum, Oswaldo Conroy, PA-C   8 months ago Left lower quadrant abdominal pain   [facility] Berniece Salines, FNP   9 months ago Hospital discharge follow-up   [facility] Margarita Mail, DO   10 months ago Elevated troponin   [facility] Margarita Mail, DO       Future Appointments             In 2 weeks Caralee Ates,  Gentry Fitz, DO [facility], [facility]   In 2 months Margarita Mail, DO [facility], [facility]

## 2022-10-15 ENCOUNTER — Other Ambulatory Visit: Payer: Self-pay | Admitting: Internal Medicine

## 2022-10-15 DIAGNOSIS — F419 Anxiety disorder, unspecified: Secondary | ICD-10-CM

## 2022-10-15 NOTE — Telephone Encounter (Signed)
Requested Prescriptions  Refused Prescriptions Disp Refills   escitalopram (LEXAPRO) 5 MG tablet [Pharmacy Med Name: ESCITALOPRAM 5 MG TABLET] 90 tablet 1    Sig: TAKE 1 TABLET (5 MG TOTAL) BY MOUTH DAILY.     Psychiatry:  Antidepressants - SSRI Passed - 10/15/2022  4:04 PM      Passed - Completed PHQ-2 or PHQ-9 in the last 360 days      Passed - Valid encounter within last 6 months    Recent Outpatient Visits           5 months ago Acute right-sided low back pain without sciatica   [facility] Margarita Mail, DO   5 months ago Acute left-sided thoracic back pain   [facility] Mecum, Oswaldo Conroy, PA-C   8 months ago Left lower quadrant abdominal pain   [facility] Berniece Salines, FNP   9 months ago Hospital discharge follow-up   [facility] Margarita Mail, DO   10 months ago Elevated troponin   [facility] Margarita Mail, DO       Future Appointments             In 2 weeks Margarita Mail, DO [facility], [facility]   In 2 months Margarita Mail, DO [facility], [facility]

## 2022-10-26 NOTE — Progress Notes (Signed)
Acute Office Visit  Subjective:     Patient ID: Brianna Huff, female    DOB: 1965-12-28, 57 y.o.   MRN: [id number]  Chief Complaint  Patient presents with   Anxiety    HPI Patient is in today for anxiety. She is also having some upper respiratory symptoms like sinus pressure, ear pain bilaterally, rhinorrhea, sinus drainage, cough productive - thick, dark. Doesn't take Zyrtec daily, does use Flonase and Astelin.   Anxiety: -Current Treatments: Lexapro 5 mg - patient has been on this a few months now and feels like the medication is no longer working for her -Patient is compliant with the above medications at above dose and reports no side effects.  -Duration:uncontrolled -Anxious mood: yes  -Irritability: yes  -Sweating: no -Nausea: no -Palpitations:no -Panic attacks: no     10/29/2022    1:40 PM 05/14/2022    1:48 PM 04/27/2022    8:48 AM 02/05/2022    1:27 PM 12/21/2021    9:47 AM  Depression screen PHQ 2/9  Decreased Interest 0 0 0 0 0  Down, Depressed, Hopeless 1 0 0 0 0  PHQ - 2 Score 1 0 0 0 0  Altered sleeping 3 0  0 0  Tired, decreased energy 1 0  0 0  Change in appetite 1 0  0 0  Feeling bad or failure about yourself  0 0  0 0  Trouble concentrating 0 0  0 0  Moving slowly or fidgety/restless 2 0  0 0  Suicidal thoughts 0 0  0 0  PHQ-9 Score 8 0  0 0  Difficult doing work/chores Not difficult at all Not difficult at all  Not difficult at all Not difficult at all        10/29/2022    1:40 PM  GAD 7 : Generalized Anxiety Score  Nervous, Anxious, on Edge 2  Control/stop worrying 1  Worry too much - different things 2  Trouble relaxing 2  Restless 2  Easily annoyed or irritable 2  Afraid - awful might happen 1  Total GAD 7 Score 12  Anxiety Difficulty Not difficult at all     Review of Systems  Constitutional:  Negative for chills and fever.  HENT:  Positive for ear pain. Negative for congestion and sore throat.   Respiratory:  Positive for  cough.   Cardiovascular:  Negative for chest pain.  Psychiatric/Behavioral:  The patient is nervous/anxious.         Objective:    BP 118/72   Pulse (!) 114   Temp 97.7 F (36.5 C)   Resp 18   Ht 5\' 8"  (1.727 m)   Wt 181 lb (82.1 kg)   SpO2 96%   BMI 27.52 kg/m    Physical Exam Constitutional:      Appearance: Normal appearance.  HENT:     Head: Normocephalic and atraumatic.     Right Ear: Ear canal and external ear normal.     Left Ear: Ear canal and external ear normal.     Ears:     Comments: Bilateral tympanosclerosis but no signs of infection     Nose: Nose normal.     Mouth/Throat:     Mouth: Mucous membranes are moist.     Comments: PND Eyes:     Conjunctiva/sclera: Conjunctivae normal.  Cardiovascular:     Rate and Rhythm: Normal rate and regular rhythm.  Pulmonary:     Effort: Pulmonary effort is normal.  Breath sounds: Normal breath sounds.  Skin:    General: Skin is warm and dry.  Neurological:     General: No focal deficit present.     Mental Status: She is alert. Mental status is at baseline.  Psychiatric:        Mood and Affect: Mood normal.        Behavior: Behavior normal.     No results found for any visits on 10/29/22.      Assessment & Plan:   1. Anxiety: Uncontrolled. Will increase Lexapro to 10 mg daily, add Hydroxyzine PRN.   - escitalopram (LEXAPRO) 10 MG tablet; Take 1 tablet (10 mg total) by mouth daily.  Dispense: 90 tablet; Refill: 0 - hydrOXYzine (ATARAX) 10 MG tablet; Take 1 tablet (10 mg total) by mouth daily as needed for anxiety.  Dispense: 30 tablet; Refill: 0  2. Sinus drainage: Treat with medrol dose pack, continue antihistamine and nasal steroid.   - methylPREDNISolone (MEDROL DOSEPAK) 4 MG TBPK tablet; Day 1: Take 8 mg (2 tablets) before breakfast, 4 mg (1 tablet) after lunch, 4 mg (1 tablet) after supper, and 8 mg (2 tablets) at bedtime. Day 2:Take 4 mg (1 tablet) before breakfast, 4 mg (1 tablet) after lunch, 4  mg (1 tablet) after supper, and 8 mg (2 tablets) at bedtime. Day 3: Take 4 mg (1 tablet) before breakfast, 4 mg (1 tablet) after lunch, 4 mg (1 tablet) after supper, and 4 mg (1 tablet) at bedtime. Day 4: Take 4 mg (1 tablet) before breakfast, 4 mg (1 tablet) after lunch, and 4 mg (1 tablet) at bedtime. Day 5: Take 4 mg (1 tablet) before breakfast and 4 mg (1 tablet) at bedtime. Day 6: Take 4 mg (1 tablet) before breakfast.  Dispense: 1 each; Refill: 0   Return if symptoms worsen or fail to improve, for already shceudled .  Margarita Mail, DO

## 2022-10-29 ENCOUNTER — Ambulatory Visit: Payer: Managed Care, Other (non HMO) | Admitting: Internal Medicine

## 2022-10-29 ENCOUNTER — Encounter: Payer: Self-pay | Admitting: Internal Medicine

## 2022-10-29 VITALS — BP 118/72 | HR 114 | Temp 97.7°F | Resp 18 | Ht 68.0 in | Wt 181.0 lb

## 2022-10-29 DIAGNOSIS — F419 Anxiety disorder, unspecified: Secondary | ICD-10-CM | POA: Diagnosis not present

## 2022-10-29 DIAGNOSIS — J3489 Other specified disorders of nose and nasal sinuses: Secondary | ICD-10-CM | POA: Diagnosis not present

## 2022-10-29 MED ORDER — ESCITALOPRAM OXALATE 10 MG PO TABS
10.0000 mg | ORAL_TABLET | Freq: Every day | ORAL | 0 refills | Status: DC
Start: 2022-10-29 — End: 2022-12-17

## 2022-10-29 MED ORDER — HYDROXYZINE HCL 10 MG PO TABS
10.0000 mg | ORAL_TABLET | Freq: Every day | ORAL | 0 refills | Status: DC | PRN
Start: 2022-10-29 — End: 2022-11-22

## 2022-10-29 MED ORDER — METHYLPREDNISOLONE 4 MG PO TBPK: ORAL_TABLET | ORAL | 0 refills | Status: DC

## 2022-11-15 ENCOUNTER — Ambulatory Visit (INDEPENDENT_AMBULATORY_CARE_PROVIDER_SITE_OTHER): Payer: Managed Care, Other (non HMO) | Admitting: Dermatology

## 2022-11-15 ENCOUNTER — Encounter: Payer: Self-pay | Admitting: Dermatology

## 2022-11-15 DIAGNOSIS — L219 Seborrheic dermatitis, unspecified: Secondary | ICD-10-CM

## 2022-11-15 DIAGNOSIS — L814 Other melanin hyperpigmentation: Secondary | ICD-10-CM | POA: Diagnosis not present

## 2022-11-15 DIAGNOSIS — L281 Prurigo nodularis: Secondary | ICD-10-CM

## 2022-11-15 DIAGNOSIS — L821 Other seborrheic keratosis: Secondary | ICD-10-CM

## 2022-11-15 DIAGNOSIS — L578 Other skin changes due to chronic exposure to nonionizing radiation: Secondary | ICD-10-CM

## 2022-11-15 DIAGNOSIS — D229 Melanocytic nevi, unspecified: Secondary | ICD-10-CM

## 2022-11-15 DIAGNOSIS — D239 Other benign neoplasm of skin, unspecified: Secondary | ICD-10-CM

## 2022-11-15 DIAGNOSIS — D1801 Hemangioma of skin and subcutaneous tissue: Secondary | ICD-10-CM

## 2022-11-15 DIAGNOSIS — Z1283 Encounter for screening for malignant neoplasm of skin: Secondary | ICD-10-CM

## 2022-11-15 DIAGNOSIS — L720 Epidermal cyst: Secondary | ICD-10-CM

## 2022-11-15 MED ORDER — TRIAMCINOLONE ACETONIDE 0.1 % EX LOTN: TOPICAL_LOTION | Freq: Two times a day (BID) | CUTANEOUS | 2 refills | Status: DC

## 2022-11-15 MED ORDER — CLOBETASOL PROPIONATE 0.05 % EX CREA: 1.0000 | TOPICAL_CREAM | Freq: Two times a day (BID) | CUTANEOUS | 0 refills | Status: AC

## 2022-11-15 NOTE — Progress Notes (Signed)
Follow-Up Visit   Subjective  Brianna Huff is a 57 y.o. female who presents for the following: Skin Cancer Screening and Full Body Skin Exam  The patient presents for Total-Body Skin Exam (TBSE) for skin cancer screening and mole check. The patient has spots, moles and lesions to be evaluated, some may be new or changing and the patient may have concern these could be cancer.  Hx of seb derm, uses ketoconazole shampoo and TMC lotion. Patient with a spot at right leg, cyst at right cheek, and a growth at left elbow that patient states has been treated with a cream.   The following portions of the chart were reviewed this encounter and updated as appropriate: medications, allergies, medical history  Review of Systems:  No other skin or systemic complaints except as noted in HPI or Assessment and Plan.  Objective  Well appearing patient in no apparent distress; mood and affect are within normal limits.  A full examination was performed including scalp, head, eyes, ears, nose, lips, neck, chest, axillae, abdomen, back, buttocks, bilateral upper extremities, bilateral lower extremities, hands, feet, fingers, toes, fingernails, and toenails. All findings within normal limits unless otherwise noted below.   Relevant physical exam findings are noted in the Assessment and Plan.    Assessment & Plan   SKIN CANCER SCREENING PERFORMED TODAY.  ACTINIC DAMAGE - Chronic condition, secondary to cumulative UV/sun exposure - diffuse scaly erythematous macules with underlying dyspigmentation - Recommend daily broad spectrum sunscreen SPF 30+ to sun-exposed areas, reapply every 2 hours as needed.  - Staying in the shade or wearing long sleeves, sun glasses (UVA+UVB protection) and wide brim hats (4-inch brim around the entire circumference of the hat) are also recommended for sun protection.  - Call for new or changing lesions.  LENTIGINES, SEBORRHEIC KERATOSES, HEMANGIOMAS - Benign normal skin  lesions - Benign-appearing - Call for any changes  MELANOCYTIC NEVI - Tan-brown and/or pink-flesh-colored symmetric macules and papules - Benign appearing on exam today - Observation - Call clinic for new or changing moles - Recommend daily use of broad spectrum spf 30+ sunscreen to sun-exposed areas.   Milia - tiny firm white papule on right zygoma - type of cyst - benign - sometimes these will clear with nightly OTC adapalene/Differin 0.1% gel - may be extracted if symptomatic - observe  SEBORRHEIC DERMATITIS, flared  Exam: Pink patches with greasy scale at left conchal bowl and anterior to triangular fossa  Seborrheic Dermatitis is a chronic persistent rash characterized by pinkness and scaling most commonly of the mid face but also can occur on the scalp (dandruff), ears; mid chest, mid back and groin.  It tends to be exacerbated by stress and cooler weather.  People who have neurologic disease may experience new onset or exacerbation of existing seborrheic dermatitis.  The condition is not curable but treatable and can be controlled.  Treatment Plan: Continue ketoconazole 2% shampoo apply 2-3 times per week, massage into scalp and leave in for 5-10 minutes before rinsing out Continue TMC 0.1% lotion twice daily for up to 2 weeks as needed for rash at ears, scalp. Avoid applying to face, groin, and axilla. Use as directed. Long-term use can cause thinning of the skin.  Topical steroids (such as triamcinolone, fluocinolone, fluocinonide, mometasone, clobetasol, halobetasol, betamethasone, hydrocortisone) can cause thinning and lightening of the skin if they are used for too long in the same area. Your physician has selected the right strength medicine for your problem and area  affected on the body. Please use your medication only as directed by your physician to prevent side effects.   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  These areas may be pink, hyperpigmented and may have excoriations and bumps (prurigo nodules-PN).  PN/LSC is commonly observed in uncontrolled atopic dermatitis and other forms of eczema, and in other itchy skin conditions (eg, insect bites, scabies).  Sometimes it is not possible to know initial cause of PN/LSC if it has been present for a long time.  It generally responds well to treatment with high potency topical steroids.  It is important to stop rubbing/scratching the area in order to break the itch-scratch-rash-itch cycle, in order for the rash to resolve.   Treatment Plan: Apply clobetasol 0.05% cream twice daily to left elbow until smooth. Avoid applying to face, groin, and axilla. Use as directed. Long-term use can cause thinning of the skin. Avoid picking/rubbing/scratching Consider biopsy if not improving in 1 month  Prurigo nodularis  Related Medications clobetasol cream (TEMOVATE) 0.05 % Apply 1 Application topically 2 (two) times daily. Until smooth. Avoid applying to face, groin, and axilla. Use as directed. Long-term use can cause thinning of the skin.  Seborrheic dermatitis  Related Medications ketoconazole (NIZORAL) 2 % shampoo Apply 1 application topically See admin instructions. apply three times per week, massage into scalp and leave in for 10 minutes before rinsing out  triamcinolone lotion (KENALOG) 0.1 % Apply topically 2 (two) times daily. To affected areas for up to 2 weeks as needed. Avoid applying to face, groin, and axilla. Use as directed. Long-term use can cause thinning of the skin.   Return in about 1 year (around 11/15/2023) for TBSE, with Dr. Katrinka Blazing.  Anise Salvo, RMA, am acting as scribe for Elie Goody, MD .   Documentation: I have reviewed the above  documentation for accuracy and completeness, and I agree with the above.  Elie Goody, MD

## 2022-11-15 NOTE — Patient Instructions (Addendum)
Recommend adapalene/Differin 0.1% gel or retinol to small cyst at right cheek.    Treatment Plan: Continue ketoconazole 2% shampoo apply 2-3 times per week, massage into scalp and leave in for 5-10 minutes before rinsing out Continue TMC 0.1% lotion twice daily for up to 2 weeks as needed for rash at ears, scalp. Avoid applying to face, groin, and axilla. Use as directed. Long-term use can cause thinning of the skin.  Topical steroids (such as triamcinolone, fluocinolone, fluocinonide, mometasone, clobetasol, halobetasol, betamethasone, hydrocortisone) can cause thinning and lightening of the skin if they are used for too long in the same area. Your physician has selected the right strength medicine for your problem and area affected on the body. Please use your medication only as directed by your physician to prevent side effects.   Treatment Plan: Apply clobetasol 0.05% cream twice daily to left elbow until smooth. Avoid applying to face, groin, and axilla. Use as directed. Long-term use can cause thinning of the skin.  Avoid picking/rubbing/scratching   Melanoma ABCDEs  Melanoma is the most dangerous type of skin cancer, and is the leading cause of death from skin disease.  You are more likely to develop melanoma if you: Have light-colored skin, light-colored eyes, or red or blond hair Spend a lot of time in the sun Tan regularly, either outdoors or in a tanning bed Have had blistering sunburns, especially during childhood Have a close family member who has had a melanoma Have atypical moles or large birthmarks  Early detection of melanoma is key since treatment is typically straightforward and cure rates are extremely high if we catch it early.   The first sign of melanoma is often a change in a mole or a new dark spot.  The ABCDE system is a way of remembering the signs of melanoma.  A for asymmetry:  The two halves do not match. B for border:  The edges of the growth are  irregular. C for color:  A mixture of colors are present instead of an even brown color. D for diameter:  Melanomas are usually (but not always) greater than 6mm - the size of a pencil eraser. E for evolution:  The spot keeps changing in size, shape, and color.  Please check your skin once per month between visits. You can use a small mirror in front and a large mirror behind you to keep an eye on the back side or your body.   If you see any new or changing lesions before your next follow-up, please call to schedule a visit.  Please continue daily skin protection including broad spectrum sunscreen SPF 30+ to sun-exposed areas, reapplying every 2 hours as needed when you're outdoors.    Due to recent changes in healthcare laws, you may see results of your pathology and/or laboratory studies on MyChart before the doctors have had a chance to review them. We understand that in some cases there may be results that are confusing or concerning to you. Please understand that not all results are received at the same time and often the doctors may need to interpret multiple results in order to provide you with the best plan of care or course of treatment. Therefore, we ask that you please give [place] 2 business days to thoroughly review all your results before contacting the office for clarification. Should we see a critical lab result, you will be contacted sooner.   If You Need Anything After Your Visit  If you have any questions or  concerns for your doctor, please call our main line at [phone] and press option 4 to reach your doctor's medical assistant. If no one answers, please leave a voicemail as directed and we will return your call as soon as possible. Messages left after 4 pm will be answered the following business day.   You may also send [place] a message via MyChart. We typically respond to MyChart messages within 1-2 business days.  For prescription refills, please ask your pharmacy to contact  our office. Our fax number is [phone].  If you have an urgent issue when the clinic is closed that cannot wait until the next business day, you can page your doctor at the number below.    Please note that while we do our best to be available for urgent issues outside of office hours, we are not available 24/7.   If you have an urgent issue and are unable to reach [place], you may choose to seek medical care at your doctor's office, retail clinic, urgent care center, or emergency room.  If you have a medical emergency, please immediately call 911 or go to the emergency department.  Pager Numbers  - Dr. Gwen Pounds: [phone]  - Dr. Roseanne Reno: [phone]  - Dr. Katrinka Blazing: [phone]   In the event of inclement weather, please call our main line at [phone] for an update on the status of any delays or closures.  Dermatology Medication Tips: Please keep the boxes that topical medications come in in order to help keep track of the instructions about where and how to use these. Pharmacies typically print the medication instructions only on the boxes and not directly on the medication tubes.   If your medication is too expensive, please contact our office at [phone] option 4 or send [place] a message through MyChart.   We are unable to tell what your co-pay for medications will be in advance as this is different depending on your insurance coverage. However, we may be able to find a substitute medication at lower cost or fill out paperwork to get insurance to cover a needed medication.   If a prior authorization is required to get your medication covered by your insurance company, please allow [place] 1-2 business days to complete this process.  Drug prices often vary depending on where the prescription is filled and some pharmacies may offer cheaper prices.  The website www.goodrx.com contains coupons for medications through different pharmacies. The prices here do not account for what the  cost may be with help from insurance (it may be cheaper with your insurance), but the website can give you the price if you did not use any insurance.  - You can print the associated coupon and take it with your prescription to the pharmacy.  - You may also stop by our office during regular business hours and pick up a GoodRx coupon card.  - If you need your prescription sent electronically to a different pharmacy, notify our office through [facility] or by phone at [phone] option 4.

## 2022-11-17 ENCOUNTER — Encounter: Payer: Managed Care, Other (non HMO) | Admitting: Dermatology

## 2022-11-21 ENCOUNTER — Other Ambulatory Visit: Payer: Self-pay | Admitting: Internal Medicine

## 2022-11-21 DIAGNOSIS — F419 Anxiety disorder, unspecified: Secondary | ICD-10-CM

## 2022-11-22 NOTE — Telephone Encounter (Signed)
Requested Prescriptions  Pending Prescriptions Disp Refills   hydrOXYzine (ATARAX) 10 MG tablet [Pharmacy Med Name: HYDROXYZINE HCL 10 MG TABLET] 90 tablet 1    Sig: TAKE 1 TABLET BY MOUTH DAILY AS NEEDED FOR ANXIETY.     Ear, Nose, and Throat:  Antihistamines 2 Passed - 11/21/2022 12:32 PM      Passed - Cr in normal range and within 360 days    Creat  Date Value Ref Range Status  04/17/2021 0.80 0.50 - 1.03 mg/dL Final   Creatinine, Ser  Date Value Ref Range Status  02/05/2022 0.76 0.44 - 1.00 mg/dL Final         Passed - Valid encounter within last 12 months    Recent Outpatient Visits           3 weeks ago Anxiety   [facility] Margarita Mail, DO   6 months ago Acute right-sided low back pain without sciatica   [facility] Margarita Mail, DO   6 months ago Acute left-sided thoracic back pain   [facility] Mecum, Oswaldo Conroy, PA-C   9 months ago Left lower quadrant abdominal pain   [facility] Berniece Salines, FNP   11 months ago Hospital discharge follow-up   [facility] Margarita Mail, DO       Future Appointments             In 3 weeks Margarita Mail, DO [facility]   In 11 months Elie Goody, MD [facility]

## 2022-12-06 ENCOUNTER — Other Ambulatory Visit: Payer: Self-pay | Admitting: Internal Medicine

## 2022-12-06 DIAGNOSIS — K219 Gastro-esophageal reflux disease without esophagitis: Secondary | ICD-10-CM

## 2022-12-09 NOTE — Telephone Encounter (Signed)
Requested Prescriptions  Pending Prescriptions Disp Refills   omeprazole (PRILOSEC) 40 MG capsule [Pharmacy Med Name: OMEPRAZOLE DR 40 MG CAPSULE] 90 capsule 0    Sig: TAKE 1 CAPSULE (40 MG TOTAL) BY MOUTH DAILY.     Gastroenterology: Proton Pump Inhibitors Passed - 12/06/2022  6:59 PM      Passed - Valid encounter within last 12 months    Recent Outpatient Visits           1 month ago Anxiety   [facility] Margarita Mail, DO   6 months ago Acute right-sided low back pain without sciatica   [facility] Margarita Mail, DO   7 months ago Acute left-sided thoracic back pain   [facility] Mecum, Oswaldo Conroy, PA-C   10 months ago Left lower quadrant abdominal pain   [facility] Berniece Salines, FNP   11 months ago Hospital discharge follow-up   [facility] Margarita Mail, DO       Future Appointments             In 1 week Margarita Mail, DO [facility]   In 11 months Elie Goody, MD [facility]

## 2022-12-16 NOTE — Progress Notes (Signed)
Established Patient Office Visit  Subjective   Patient ID: Brianna Huff, female    DOB: Oct 09, 1965  Age: 56 y.o. MRN: [id number]  Chief Complaint  Patient presents with   Medical Management of Chronic Issues    HPI  Patient is here for follow up on chronic medical conditions.   GERD: -Having issues with increased gas, burping and burning epigastric pain -No nausea or vomiting but it does affect sleep sometimes -Currently on Omeprazole 40 mg twice a day and zantac in the evening but still having symptoms  -Weight stable  HLD: -Medications: Lipitor 40 mg -Patient is compliant with above medications and reports no side effects.  -Last lipid panel: Lipid Panel     Component Value Date/Time   CHOL 227 (H) 12/16/2021 0402   TRIG 273 (H) 12/16/2021 0402   HDL 47 12/16/2021 0402   CHOLHDL 4.8 12/16/2021 0402   VLDL 55 (H) 12/16/2021 0402   LDLCALC 125 (H) 12/16/2021 0402   LDLCALC 125 (H) 04/17/2021 1014    Pre-Diabetes: -A1c 12/23 5.7% -Not on medications currently   Hx of kidney stones:  -History of stones twice, most recent in January, had to go to the ER. Multiple stones on CT, largest 3 mm, passed on their own. Tries to avoid tea and dark colas, tries to stay well hydrated.  Seasonal Allergies: -Currently on Flonase -Complaining of watery, itching eyes, sneezing   Anxiety:  -Lexapro increased to 10 mg at LOV, patient is stable and doing well, no side effects  Health Maintenance: -Blood work due -Mammogram 6/24 Birads-1 -Colon cancer screening: colnoscopy 8/23, repeat in 7 years -Complete Paps, had hysterctomy in early 2000's  Patient Active Problem List   Diagnosis Date Noted   Mixed hyperlipidemia 12/17/2022   Tobacco use 04/27/2022   NSTEMI (non-ST elevated myocardial infarction) (HCC) 12/14/2021   Encounter for smoking cessation counseling 12/14/2021   Alcohol use 12/14/2021   Alcohol use 12/14/2021   Encounter for smoking cessation counseling  12/14/2021   NSTEMI (non-ST elevated myocardial infarction) (HCC) 12/14/2021   Screening for colon cancer    Polyp of sigmoid colon    Gastroesophageal reflux disease 04/17/2021   History of kidney stones 04/17/2021   Hot flashes 04/17/2021   Seasonal allergies 04/17/2021   History of kidney stones 04/17/2021   RAD (reactive airway disease), moderate persistent, uncomplicated 07/30/2016   Dyspepsia 01/30/2016   Prediabetes 01/30/2016   Polyp of colon 01/30/2016   Chronic allergic rhinitis 07/11/2015   Spondylosis of lumbar region without myelopathy or radiculopathy 07/11/2015   Varicose veins of both lower extremities with pain 07/11/2015   Left ureteral stone 11/16/2014   Gross hematuria 11/05/2014   Hematuria 10/27/2014   Frank hematuria 10/25/2014   Acute vaginitis 10/01/2014   Acute back pain with sciatica 09/25/2014   Hemorrhoids, internal 09/25/2014   Anxiety 09/25/2014   Candida vaginitis 09/25/2014   Cigarette smoker 02/20/2014   Past Medical History:  Diagnosis Date   Anxiety and depression    Cyst of left kidney    GERD (gastroesophageal reflux disease)    Gross hematuria    Internal hemorrhoids    Kidney stones    Low back pain    Lower abdominal pain    Monilial vaginitis    Nasal congestion    PONV (postoperative nausea and vomiting)    Shortness of breath dyspnea    Vaginitis    Past Surgical History:  Procedure Laterality Date   ABDOMINAL HYSTERECTOMY  still has ovaries    COLONOSCOPY N/A 08/21/2021   Procedure: COLONOSCOPY;  Surgeon: Midge Minium, MD;  [facility];  Service: Endoscopy;  Laterality: N/A;   CYSTOSCOPY W/ RETROGRADES Bilateral 11/26/2014   Procedure: CYSTOSCOPY WITH RETROGRADE PYELOGRAM;  Surgeon: Vanna Scotland, MD;  Location: [facility];  Service: Urology;  Laterality: Bilateral;   CYSTOSCOPY WITH STENT PLACEMENT Left 11/13/2014   Procedure: CYSTOSCOPY, left retrograde pyelogram WITH left ureteral STENT PLACEMENT;   Surgeon: Malen Gauze, MD;  Location: [facility];  Service: Urology;  Laterality: Left;   CYSTOSCOPY/URETEROSCOPY/HOLMIUM LASER/STENT PLACEMENT Left 11/26/2014   Procedure: CYSTOSCOPY/URETEROSCOPY/HOLMIUM LASER/STENT PLACEMENT;  Surgeon: Vanna Scotland, MD;  Location: [facility];  Service: Urology;  Laterality: Left;   ESOPHAGOGASTRODUODENOSCOPY N/A 08/21/2021   Procedure: ESOPHAGOGASTRODUODENOSCOPY (EGD);  Surgeon: Midge Minium, MD;  Location: [facility];  Service: Endoscopy;  Laterality: N/A;   LEFT HEART CATH AND CORONARY ANGIOGRAPHY N/A 12/16/2021   Procedure: LEFT HEART CATH AND CORONARY ANGIOGRAPHY and possible PCI;  Surgeon: Alwyn Pea, MD;  Location: [facility];  Service: Cardiovascular;  Laterality: N/A;   NASAL SINUS SURGERY     POLYPECTOMY  08/21/2021   Procedure: POLYPECTOMY;  Surgeon: Midge Minium, MD;  Location: [facility];  Service: Endoscopy;;   TUBAL LIGATION     Social History   Tobacco Use   Smoking status: Every Day    Current packs/day: 1.00    Average packs/day: 1 pack/day for 35.0 years (35.0 ttl pk-yrs)    Types: Cigarettes   Smokeless tobacco: Never  Vaping Use   Vaping status: Never Used  Substance Use Topics   Alcohol use: Yes    Alcohol/week: 4.0 standard drinks of alcohol    Types: 4 Standard drinks or equivalent per week   Drug use: No   Social History   Socioeconomic History   Marital status: Single    Spouse name: Not on file   Number of children: Not on file   Years of education: Not on file   Highest education level: 12th grade  Occupational History   Not on file  Tobacco Use   Smoking status: Every Day    Current packs/day: 1.00    Average packs/day: 1 pack/day for 35.0 years (35.0 ttl pk-yrs)    Types: Cigarettes   Smokeless tobacco: Never  Vaping Use   Vaping status: Never Used  Substance and Sexual Activity   Alcohol use: Yes    Alcohol/week: 4.0 standard drinks of alcohol    Types: 4 Standard  drinks or equivalent per week   Drug use: No   Sexual activity: Not Currently  Other Topics Concern   Not on file  Social History Narrative   Not on file   Social Drivers of Health   Financial Resource Strain: Low Risk  (10/25/2022)   Overall Financial Resource Strain (CARDIA)    Difficulty of Paying Living Expenses: Not hard at all  Food Insecurity: No Food Insecurity (10/25/2022)   Hunger Vital Sign    Worried About Running Out of Food in the Last Year: Never true    Ran Out of Food in the Last Year: Never true  Transportation Needs: No Transportation Needs (10/25/2022)   PRAPARE - Administrator, Civil Service (Medical): No    Lack of Transportation (Non-Medical): No  Physical Activity: Unknown (10/25/2022)   Exercise Vital Sign    Days of Exercise per Week: 0 days    Minutes of Exercise per Session: Not  on file  Stress: Stress Concern Present (10/25/2022)   [employer] of Occupational Health - Occupational Stress Questionnaire    Feeling of Stress : To some extent  Social Connections: Socially Isolated (10/25/2022)   Social Connection and Isolation Panel [NHANES]    Frequency of Communication with Friends and Family: More than three times a week    Frequency of Social Gatherings with Friends and Family: Twice a week    Attends Religious Services: Never    Database administrator or Organizations: No    Attends Engineer, structural: Not on file    Marital Status: Divorced  Catering manager Violence: Not on file   Family Status  Relation Name Status   Mother  Deceased   Father  Alive   Daughter 1 Alive   Neg Hx  (Not Specified)  No partnership data on file   Family History  Problem Relation Age of Onset   Hyperlipidemia Mother    Hypertension Mother    Breast cancer Mother    Asthma Father    Prostate cancer Neg Hx    Kidney cancer Neg Hx    Bladder Cancer Neg Hx      Review of Systems  Gastrointestinal:  Positive for heartburn.   All other systems reviewed and are negative.     Objective:     BP 118/68   Pulse (!) 108   Temp 98 F (36.7 C)   Resp 18   Ht 5\' 8"  (1.727 m)   Wt 178 lb 14.4 oz (81.1 kg)   SpO2 98%   BMI 27.20 kg/m  BP Readings from Last 3 Encounters:  12/17/22 118/68  10/29/22 118/72  05/14/22 116/84   Wt Readings from Last 3 Encounters:  12/17/22 178 lb 14.4 oz (81.1 kg)  10/29/22 181 lb (82.1 kg)  05/14/22 179 lb 14.4 oz (81.6 kg)      Physical Exam Constitutional:      Appearance: Normal appearance.  HENT:     Head: Normocephalic and atraumatic.     Mouth/Throat:     Mouth: Mucous membranes are moist.     Comments: PND Eyes:     Conjunctiva/sclera: Conjunctivae normal.  Cardiovascular:     Rate and Rhythm: Normal rate and regular rhythm.  Pulmonary:     Effort: Pulmonary effort is normal.     Breath sounds: Normal breath sounds.  Skin:    General: Skin is warm and dry.  Neurological:     General: No focal deficit present.     Mental Status: She is alert. Mental status is at baseline.  Psychiatric:        Mood and Affect: Mood normal.        Behavior: Behavior normal.      No results found for any visits on 12/17/22.  Last CBC Lab Results  Component Value Date   WBC 9.9 02/05/2022   HGB 13.4 02/05/2022   HCT 40.3 02/05/2022   MCV 95.5 02/05/2022   MCH 31.8 02/05/2022   RDW 12.5 02/05/2022   PLT 274 02/05/2022   Last metabolic panel Lab Results  Component Value Date   GLUCOSE 110 (H) 02/05/2022   NA 139 02/05/2022   K 3.8 02/05/2022   CL 103 02/05/2022   CO2 29 02/05/2022   BUN 13 02/05/2022   CREATININE 0.76 02/05/2022   GFRNONAA >60 02/05/2022   CALCIUM 9.3 02/05/2022   PROT 7.1 02/05/2022   ALBUMIN 4.2 02/05/2022   BILITOT 0.7 02/05/2022  ALKPHOS 101 02/05/2022   AST 26 02/05/2022   ALT 23 02/05/2022   ANIONGAP 7 02/05/2022   Last lipids Lab Results  Component Value Date   CHOL 227 (H) 12/16/2021   HDL 47 12/16/2021   LDLCALC 125  (H) 12/16/2021   TRIG 273 (H) 12/16/2021   CHOLHDL 4.8 12/16/2021   Last hemoglobin A1c Lab Results  Component Value Date   HGBA1C 5.7 (H) 12/16/2021   Last thyroid functions Lab Results  Component Value Date   TSH 2.50 04/17/2021   Last vitamin D No results found for: "25OHVITD2", "25OHVITD3", "VD25OH" Last vitamin B12 and Folate No results found for: "VITAMINB12", "FOLATE"    The ASCVD Risk score (Arnett DK, et al., 2019) failed to calculate for the following reasons:   Risk score cannot be calculated because patient has a medical history suggesting prior/existing ASCVD    Assessment & Plan:  Mixed hyperlipidemia Assessment & Plan: Recheck lipid panel today, continue Lipitor 40 mg, refill today.   Orders: -     Lipid panel -     Atorvastatin Calcium; Take 1 tablet (40 mg total) by mouth daily.  Dispense: 90 tablet; Refill: 0  Prediabetes Assessment & Plan: Check A1c today.   Orders: -     Hemoglobin A1c  Gastroesophageal reflux disease, unspecified whether esophagitis present Assessment & Plan: Symptoms still uncontrolled, having to Prilosec 40 mg BID plus H2 blocker or something else. Discussed trying to prescribe Voquenza but patient would like to wait. Refill PPI. Does follow with GI.  Orders: -     Omeprazole; Take 1 capsule (40 mg total) by mouth 2 (two) times daily.  Dispense: 90 capsule; Refill: 3  Anxiety Assessment & Plan: Stable, doing well on Lexapro 10 mg, refill.   Orders: -     CBC with Differential/Platelet -     COMPLETE METABOLIC PANEL WITH GFR -     Escitalopram Oxalate; Take 1 tablet (10 mg total) by mouth daily.  Dispense: 90 tablet; Refill: 0  Chronic allergic rhinitis Assessment & Plan: Stable, refill Flonase and anti-histamine.   Orders: -     Fluticasone Propionate; Place 2 sprays into both nostrils daily.  Dispense: 16 mL; Refill: 1 -     Cetirizine HCl; Take 1 tablet (10 mg total) by mouth daily.  Dispense: 30 tablet; Refill:  11  Vitamin D deficiency -     VITAMIN D 25 Hydroxy (Vit-D Deficiency, Fractures)  Screening for lung cancer -     Ambulatory Referral for Lung Cancer Scre     Return in 6 months (on 06/17/2023) for cpe W/ PAP.    Margarita Mail, DO

## 2022-12-17 ENCOUNTER — Ambulatory Visit: Payer: Managed Care, Other (non HMO) | Admitting: Internal Medicine

## 2022-12-17 ENCOUNTER — Encounter: Payer: Self-pay | Admitting: Internal Medicine

## 2022-12-17 VITALS — BP 118/68 | HR 108 | Temp 98.0°F | Resp 18 | Ht 68.0 in | Wt 178.9 lb

## 2022-12-17 DIAGNOSIS — K219 Gastro-esophageal reflux disease without esophagitis: Secondary | ICD-10-CM

## 2022-12-17 DIAGNOSIS — Z122 Encounter for screening for malignant neoplasm of respiratory organs: Secondary | ICD-10-CM

## 2022-12-17 DIAGNOSIS — R7303 Prediabetes: Secondary | ICD-10-CM

## 2022-12-17 DIAGNOSIS — F419 Anxiety disorder, unspecified: Secondary | ICD-10-CM

## 2022-12-17 DIAGNOSIS — E559 Vitamin D deficiency, unspecified: Secondary | ICD-10-CM

## 2022-12-17 DIAGNOSIS — E782 Mixed hyperlipidemia: Secondary | ICD-10-CM | POA: Insufficient documentation

## 2022-12-17 DIAGNOSIS — J309 Allergic rhinitis, unspecified: Secondary | ICD-10-CM

## 2022-12-17 MED ORDER — FLUTICASONE PROPIONATE 50 MCG/ACT NA SUSP: 2.0000 | Freq: Every day | NASAL | 1 refills | Status: DC

## 2022-12-17 MED ORDER — ESCITALOPRAM OXALATE 10 MG PO TABS: 10.0000 mg | ORAL_TABLET | Freq: Every day | ORAL | 0 refills | Status: DC

## 2022-12-17 MED ORDER — ATORVASTATIN CALCIUM 40 MG PO TABS: 40.0000 mg | ORAL_TABLET | Freq: Every day | ORAL | 0 refills | Status: DC

## 2022-12-17 MED ORDER — OMEPRAZOLE 40 MG PO CPDR: 40.0000 mg | DELAYED_RELEASE_CAPSULE | Freq: Two times a day (BID) | ORAL | 3 refills | Status: DC

## 2022-12-17 MED ORDER — CETIRIZINE HCL 10 MG PO TABS: 10.0000 mg | ORAL_TABLET | Freq: Every day | ORAL | 11 refills | Status: DC

## 2022-12-17 NOTE — Assessment & Plan Note (Signed)
Check A1c today.

## 2022-12-17 NOTE — Assessment & Plan Note (Signed)
Symptoms still uncontrolled, having to Prilosec 40 mg BID plus H2 blocker or something else. Discussed trying to prescribe Voquenza but patient would like to wait. Refill PPI. Does follow with GI.

## 2022-12-17 NOTE — Assessment & Plan Note (Signed)
Stable, refill Flonase and anti-histamine.

## 2022-12-17 NOTE — Assessment & Plan Note (Signed)
Stable, doing well on Lexapro 10 mg, refill.

## 2022-12-17 NOTE — Assessment & Plan Note (Signed)
Recheck lipid panel today, continue Lipitor 40 mg, refill today.

## 2022-12-18 LAB — LIPID PANEL
Cholesterol: 168 mg/dL (ref <200)
HDL: 59 mg/dL (ref >=50)
LDL Cholesterol (Calc): 86 mg/dL
Non-HDL Cholesterol (Calc): 109 mg/dL (ref <130)
Total CHOL/HDL Ratio: 2.8 (calc) (ref <5.0)
Triglycerides: 134 mg/dL (ref <150)

## 2022-12-18 LAB — CBC WITH DIFFERENTIAL/PLATELET
Absolute Lymphocytes: 2166 {cells}/uL (ref 850–3900)
Absolute Monocytes: 555 {cells}/uL (ref 200–950)
Basophils Absolute: 73 {cells}/uL (ref 0–200)
Basophils Relative: 0.8 %
Eosinophils Absolute: 146 {cells}/uL (ref 15–500)
Eosinophils Relative: 1.6 %
HCT: 44.7 % (ref 35.0–45.0)
Hemoglobin: 14.8 g/dL (ref 11.7–15.5)
MCH: 31.2 pg (ref 27.0–33.0)
MCHC: 33.1 g/dL (ref 32.0–36.0)
MCV: 94.1 fL (ref 80.0–100.0)
MPV: 10.3 fL (ref 7.5–12.5)
Monocytes Relative: 6.1 %
Neutro Abs: 6161 {cells}/uL (ref 1500–7800)
Neutrophils Relative %: 67.7 %
Platelets: 302 10*3/uL (ref 140–400)
RBC: 4.75 10*6/uL (ref 3.80–5.10)
RDW: 12.2 % (ref 11.0–15.0)
Total Lymphocyte: 23.8 %
WBC: 9.1 10*3/uL (ref 3.8–10.8)

## 2022-12-18 LAB — COMPLETE METABOLIC PANEL WITH GFR
AG Ratio: 1.9 (calc) (ref 1.0–2.5)
ALT: 24 U/L (ref 6–29)
AST: 26 U/L (ref 10–35)
Albumin: 4.5 g/dL (ref 3.6–5.1)
Alkaline phosphatase (APISO): 112 U/L (ref 37–153)
BUN: 14 mg/dL (ref 7–25)
CO2: 29 mmol/L (ref 20–32)
Calcium: 10.4 mg/dL (ref 8.6–10.4)
Chloride: 102 mmol/L (ref 98–110)
Creat: 0.85 mg/dL (ref 0.50–1.03)
Globulin: 2.4 g/dL (ref 1.9–3.7)
Glucose, Bld: 93 mg/dL (ref 65–99)
Potassium: 4.6 mmol/L (ref 3.5–5.3)
Sodium: 140 mmol/L (ref 135–146)
Total Bilirubin: 0.5 mg/dL (ref 0.2–1.2)
Total Protein: 6.9 g/dL (ref 6.1–8.1)
eGFR: 80 mL/min/{1.73_m2} (ref >=60)

## 2022-12-18 LAB — HEMOGLOBIN A1C
Hgb A1c MFr Bld: 5.6 %{Hb} (ref <5.7)
Mean Plasma Glucose: 114 mg/dL
eAG (mmol/L): 6.3 mmol/L

## 2022-12-18 LAB — VITAMIN D 25 HYDROXY (VIT D DEFICIENCY, FRACTURES): Vit D, 25-Hydroxy: 35 ng/mL (ref 30–100)

## 2023-01-13 ENCOUNTER — Ambulatory Visit: Payer: Managed Care, Other (non HMO) | Admitting: Internal Medicine

## 2023-01-13 ENCOUNTER — Encounter: Payer: Self-pay | Admitting: Internal Medicine

## 2023-01-13 ENCOUNTER — Other Ambulatory Visit: Payer: Self-pay

## 2023-01-13 VITALS — BP 130/70 | HR 100 | Temp 98.4°F | Resp 16 | Ht 68.0 in | Wt 181.9 lb

## 2023-01-13 DIAGNOSIS — J0100 Acute maxillary sinusitis, unspecified: Secondary | ICD-10-CM

## 2023-01-13 MED ORDER — METHYLPREDNISOLONE 4 MG PO TBPK: ORAL_TABLET | ORAL | 0 refills | Status: DC

## 2023-01-13 MED ORDER — DOXYCYCLINE HYCLATE 100 MG PO TABS
100.0000 mg | ORAL_TABLET | Freq: Two times a day (BID) | ORAL | 0 refills | Status: AC
Start: 2023-01-13 — End: 2023-01-18

## 2023-01-13 NOTE — Progress Notes (Signed)
 Acute Office Visit  Subjective:     Patient ID: Brianna Huff, female    DOB: 1965-07-14, 58 y.o.   MRN: [id number]  Chief Complaint  Patient presents with   Sinusitis    Sinus drainage, cough for 1 week    Sinusitis Associated symptoms include congestion and coughing. Pertinent negatives include no chills, ear pain, shortness of breath or sore throat.   Patient is in today for sinus drainage. Symptoms started 1 week ago.   URI Compliant:  -Fever: no -Cough: yes, mainly dry -Shortness of breath: no -Wheezing: no -Nasal congestion: yes -Runny nose: no -Post nasal drip: yes -Sneezing: yes -Sore throat: no -Sinus pressure: yes -Headache: yes -Face pain: yes -Ear pain: no  -Ear pressure: no  -Fatigue: yes -Sick contacts: no  -Context: worse -Recurrent sinusitis: no -Relief with OTC cold/cough medications: no  -Treatments attempted: Flonase  and Astelin    Review of Systems  Constitutional:  Positive for malaise/fatigue. Negative for chills and fever.  HENT:  Positive for congestion and sinus pain. Negative for ear pain and sore throat.   Respiratory:  Positive for cough. Negative for sputum production, shortness of breath and wheezing.   Cardiovascular:  Negative for chest pain.        Objective:    BP 130/70 (Cuff Size: Large)   Pulse 100   Temp 98.4 F (36.9 C) (Oral)   Resp 16   Ht 5' 8 (1.727 m)   Wt 181 lb 14.4 oz (82.5 kg)   SpO2 98%   BMI 27.66 kg/m  BP Readings from Last 3 Encounters:  01/13/23 130/70  12/17/22 118/68  10/29/22 118/72   Wt Readings from Last 3 Encounters:  01/13/23 181 lb 14.4 oz (82.5 kg)  12/17/22 178 lb 14.4 oz (81.1 kg)  10/29/22 181 lb (82.1 kg)      Physical Exam Constitutional:      Appearance: Normal appearance.  HENT:     Head: Normocephalic and atraumatic.     Comments: Maxillary sinus ptp    Right Ear: Tympanic membrane, ear canal and external ear normal.     Left Ear: Tympanic membrane, ear canal and  external ear normal.     Nose: Congestion present.     Mouth/Throat:     Mouth: Mucous membranes are moist.     Pharynx: Posterior oropharyngeal erythema present.  Eyes:     Conjunctiva/sclera: Conjunctivae normal.  Cardiovascular:     Rate and Rhythm: Normal rate and regular rhythm.  Pulmonary:     Effort: Pulmonary effort is normal.     Breath sounds: Normal breath sounds. No wheezing, rhonchi or rales.  Musculoskeletal:     Cervical back: No tenderness.  Lymphadenopathy:     Cervical: No cervical adenopathy.  Skin:    General: Skin is warm and dry.  Neurological:     General: No focal deficit present.     Mental Status: She is alert. Mental status is at baseline.  Psychiatric:        Mood and Affect: Mood normal.        Behavior: Behavior normal.     No results found for any visits on 01/13/23.      Assessment & Plan:   1. Acute non-recurrent maxillary sinusitis (Primary): Patient has been dealing with symptoms for 7 days but they are worsening with sinus pain and congestion. Will treat with Doxycyline due to [facility] allergy and steroids. Recommend over the counter Mucinex with a decongestant as well.   -  doxycycline  (VIBRA -TABS) 100 MG tablet; Take 1 tablet (100 mg total) by mouth 2 (two) times daily for 5 days.  Dispense: 10 tablet; Refill: 0 - methylPREDNISolone  (MEDROL  DOSEPAK) 4 MG TBPK tablet; Day 1: Take 8 mg (2 tablets) before breakfast, 4 mg (1 tablet) after lunch, 4 mg (1 tablet) after supper, and 8 mg (2 tablets) at bedtime. Day 2:Take 4 mg (1 tablet) before breakfast, 4 mg (1 tablet) after lunch, 4 mg (1 tablet) after supper, and 8 mg (2 tablets) at bedtime. Day 3: Take 4 mg (1 tablet) before breakfast, 4 mg (1 tablet) after lunch, 4 mg (1 tablet) after supper, and 4 mg (1 tablet) at bedtime. Day 4: Take 4 mg (1 tablet) before breakfast, 4 mg (1 tablet) after lunch, and 4 mg (1 tablet) at bedtime. Day 5: Take 4 mg (1 tablet) before breakfast and 4 mg (1 tablet) at  bedtime. Day 6: Take 4 mg (1 tablet) before breakfast.  Dispense: 1 each; Refill: 0  Return if symptoms worsen or fail to improve.  Sharyle Fischer, DO

## 2023-01-21 ENCOUNTER — Other Ambulatory Visit: Payer: Self-pay | Admitting: Internal Medicine

## 2023-01-21 DIAGNOSIS — J309 Allergic rhinitis, unspecified: Secondary | ICD-10-CM

## 2023-01-21 NOTE — Telephone Encounter (Signed)
Requested medication (s) are due for refill today: Yes  Requested medication (s) are on the active medication list: Yes  Last refill:  12/17/22, 16mL, 1RF  Future visit scheduled: Yes  Notes to clinic:  Unable to refill per protocol due to diagnosis code needed. Pharmacy requesting 90-day supply.     Requested Prescriptions  Pending Prescriptions Disp Refills   fluticasone (FLONASE) 50 MCG/ACT nasal spray [Pharmacy Med Name: FLUTICASONE PROP 50 MCG SPRAY] 48 mL 1    Sig: SPRAY 2 SPRAYS INTO EACH NOSTRIL EVERY DAY     Ear, Nose, and Throat: Nasal Preparations - Corticosteroids Passed - 01/21/2023  3:06 PM      Passed - Valid encounter within last 12 months    Recent Outpatient Visits           1 week ago Acute non-recurrent maxillary sinusitis   [facility] Margarita Mail, DO   1 month ago Mixed hyperlipidemia   [facility] Margarita Mail, DO   2 months ago Anxiety   [facility] Margarita Mail, DO   8 months ago Acute right-sided low back pain without sciatica   [facility] Margarita Mail, DO   8 months ago Acute left-sided thoracic back pain   [facility] Mecum, Oswaldo Conroy, PA-C       Future Appointments             In 5 months Margarita Mail, DO [facility]   In 9 months Elie Goody, MD [facility]

## 2023-03-10 ENCOUNTER — Other Ambulatory Visit: Payer: Self-pay | Admitting: Internal Medicine

## 2023-03-10 DIAGNOSIS — K219 Gastro-esophageal reflux disease without esophagitis: Secondary | ICD-10-CM

## 2023-03-10 NOTE — Telephone Encounter (Signed)
 Refused Omeprazole because it's being requested too soon.

## 2023-04-24 ENCOUNTER — Other Ambulatory Visit: Payer: Self-pay | Admitting: Internal Medicine

## 2023-04-24 DIAGNOSIS — F419 Anxiety disorder, unspecified: Secondary | ICD-10-CM

## 2023-04-25 NOTE — Telephone Encounter (Signed)
 Requested Prescriptions  Pending Prescriptions Disp Refills   escitalopram  (LEXAPRO ) 10 MG tablet [Pharmacy Med Name: ESCITALOPRAM  10 MG TABLET] 90 tablet 0    Sig: TAKE 1 TABLET BY MOUTH EVERY DAY     Psychiatry:  Antidepressants - SSRI Failed - 04/25/2023  2:40 PM      Failed - Valid encounter within last 6 months    Recent Outpatient Visits   None     Future Appointments             In 2 months Rockney Cid, DO [facility]   In 6 months Harris Liming, MD [facility]

## 2023-05-23 ENCOUNTER — Other Ambulatory Visit: Payer: Self-pay | Admitting: Internal Medicine

## 2023-05-23 DIAGNOSIS — E782 Mixed hyperlipidemia: Secondary | ICD-10-CM

## 2023-05-25 NOTE — Telephone Encounter (Signed)
 Requested Prescriptions  Pending Prescriptions Disp Refills   atorvastatin  (LIPITOR) 40 MG tablet [Pharmacy Med Name: ATORVASTATIN  40 MG TABLET] 90 tablet 0    Sig: TAKE 1 TABLET BY MOUTH EVERY DAY     Cardiovascular:  Antilipid - Statins Failed - 05/25/2023  1:38 PM      Failed - Valid encounter within last 12 months    Recent Outpatient Visits   None     Future Appointments             In 1 month Rockney Cid, DO [facility]   In 5 months Harris Liming, MD [facility]            Failed - Lipid Panel in normal range within the last 12 months    Cholesterol  Date Value Ref Range Status  12/17/2022 168 <200 mg/dL Final   LDL Cholesterol (Calc)  Date Value Ref Range Status  12/17/2022 86 mg/dL (calc) Final    Comment:    Reference range: <100 . Desirable range <100 mg/dL for primary prevention;   <70 mg/dL for patients with CHD or diabetic patients  with > or = 2 CHD risk factors. Aaron Aas LDL-C is now calculated using the Martin-Hopkins  calculation, which is a validated novel method providing  better accuracy than the Friedewald equation in the  estimation of LDL-C.  Melinda Sprawls et al. Erroll Heard. 1610;960(45): 2061-2068  (http://education.[employer].com/faq/FAQ164)    HDL  Date Value Ref Range Status  12/17/2022 59 > OR = 50 mg/dL Final   Triglycerides  Date Value Ref Range Status  12/17/2022 134 <150 mg/dL Final         Passed - Patient is not pregnant

## 2023-07-04 ENCOUNTER — Ambulatory Visit (INDEPENDENT_AMBULATORY_CARE_PROVIDER_SITE_OTHER): Payer: Self-pay | Admitting: Internal Medicine

## 2023-07-04 ENCOUNTER — Encounter: Payer: Self-pay | Admitting: Internal Medicine

## 2023-07-04 ENCOUNTER — Other Ambulatory Visit: Payer: Self-pay

## 2023-07-04 VITALS — BP 120/74 | HR 100 | Temp 98.0°F | Resp 18 | Ht 68.0 in | Wt 181.1 lb

## 2023-07-04 DIAGNOSIS — F419 Anxiety disorder, unspecified: Secondary | ICD-10-CM | POA: Diagnosis not present

## 2023-07-04 DIAGNOSIS — Z122 Encounter for screening for malignant neoplasm of respiratory organs: Secondary | ICD-10-CM | POA: Diagnosis not present

## 2023-07-04 DIAGNOSIS — Z1231 Encounter for screening mammogram for malignant neoplasm of breast: Secondary | ICD-10-CM

## 2023-07-04 DIAGNOSIS — R232 Flushing: Secondary | ICD-10-CM | POA: Diagnosis not present

## 2023-07-04 DIAGNOSIS — E782 Mixed hyperlipidemia: Secondary | ICD-10-CM

## 2023-07-04 DIAGNOSIS — J3489 Other specified disorders of nose and nasal sinuses: Secondary | ICD-10-CM

## 2023-07-04 DIAGNOSIS — Z0000 Encounter for general adult medical examination without abnormal findings: Secondary | ICD-10-CM

## 2023-07-04 MED ORDER — VENLAFAXINE HCL ER 37.5 MG PO CP24: 37.5000 mg | ORAL_CAPSULE | Freq: Every day | ORAL | 0 refills | Status: DC

## 2023-07-04 MED ORDER — ATORVASTATIN CALCIUM 40 MG PO TABS: 40.0000 mg | ORAL_TABLET | Freq: Every day | ORAL | 1 refills | Status: AC

## 2023-07-04 NOTE — Progress Notes (Signed)
 Name: Brianna Huff   MRN: [id number]    DOB: [birth year]   Date:07/04/2023       Progress Note  Subjective  Chief Complaint  Chief Complaint  Patient presents with   Annual Exam    HPI  Patient presents for annual CPE.  Discussed the use of AI scribe software for clinical note transcription with the patient, who gave verbal consent to proceed.  History of Present Illness  Brianna Huff is a 58 year old female who presents for an annual physical exam.  She maintains regular physical activity by walking her dog about five days a week. Her weight has remained stable since January. She experiences menopausal symptoms including hot flashes, sweating, and irritability, and has tried black cohosh without relief. She takes Lexapro  10 mg daily for anxiety and depression, but increasing the dose did not improve her symptoms. She uses Flonase  as needed for sinus drainage and Zyrtec  at night for allergies, experiencing sinus drainage particularly when lying down. She takes Prilosec and a cholesterol medication daily. She reports a recent insect bite that she squeezed, resulting in a blood blister, which appears to be healing without signs of infection. She has not been sexually active for at least five and a half years and has no concerns about sexually transmitted diseases. She has a history of smoking for more than 20 year.  Diet: Regular Exercise: 5 days a week 20 minutes  - walking the dog  Last Eye Exam: will schedule Last Dental Exam: completed  Flowsheet Row Office Visit from 07/04/2023 in [facility]  AUDIT-C Score 2   Depression: Phq 9 is  negative    07/04/2023    3:12 PM 10/29/2022    1:40 PM 05/14/2022    1:48 PM 04/27/2022    8:48 AM 02/05/2022    1:27 PM  Depression screen PHQ 2/9  Decreased Interest 0 0 0 0 0  Down, Depressed, Hopeless 0 1 0 0 0  PHQ - 2 Score 0 1 0 0 0  Altered sleeping  3 0  0  Tired, decreased energy  1 0  0  Change in  appetite  1 0  0  Feeling bad or failure about yourself   0 0  0  Trouble concentrating  0 0  0  Moving slowly or fidgety/restless  2 0  0  Suicidal thoughts  0 0  0  PHQ-9 Score  8 0  0  Difficult doing work/chores  Not difficult at all Not difficult at all  Not difficult at all   Hypertension: BP Readings from Last 3 Encounters:  07/04/23 120/74  01/13/23 130/70  12/17/22 118/68   Obesity: Wt Readings from Last 3 Encounters:  07/04/23 181 lb 1.6 oz (82.1 kg)  01/13/23 181 lb 14.4 oz (82.5 kg)  12/17/22 178 lb 14.4 oz (81.1 kg)   BMI Readings from Last 3 Encounters:  07/04/23 27.54 kg/m  01/13/23 27.66 kg/m  12/17/22 27.20 kg/m     Vaccines: Reviewed with the patient. Patient not interested in regular vaccines.   Hep C Screening: completed STD testing and prevention (HIV/chl/gon/syphilis): no concerns  Intimate partner violence: negative screen  Menstrual History/LMP/Abnormal Bleeding: Total hysterectomy 30 years ago Discussed importance of follow up if any post-menopausal bleeding: yes  Incontinence Symptoms: negative for symptoms   Breast cancer:  - Last Mammogram: 07/02/2022 Bir[facility]-1  Osteoporosis Prevention : Discussed high calcium  and vitamin D  supplementation, weight bearing exercises Bone density :not  applicable   Cervical cancer screening: not applicable due to hysterectomy  Skin cancer: Discussed monitoring for atypical lesions  Colorectal cancer: completed  colonoscopy 8/23 - repeat in 7 years Lung cancer:  Low Dose CT Chest recommended if Age 83-80 years, 20 pack-year currently smoking OR have quit w/in 15years. Patient does qualify for screen   ECG: 12/21/21  Advanced Care Planning: A voluntary discussion about advance care planning including the explanation and discussion of advance directives.  Discussed health care proxy and Living will, and the patient was able to identify a health care proxy as Cheryl Mains (dad).  Patient does not have a living will  and power of attorney of health care    Current Outpatient Medications:    Azelastine  HCl 137 MCG/SPRAY SOLN, PLACE 1 SPRAY INTO BOTH NOSTRILS 2 (TWO) TIMES DAILY. USE IN EACH NOSTRIL AS DIRECTED, Disp: 90 mL, Rfl: 0   cetirizine  (ZYRTEC ) 10 MG tablet, Take 1 tablet (10 mg total) by mouth daily., Disp: 30 tablet, Rfl: 11   clobetasol  cream (TEMOVATE ) 0.05 %, Apply 1 Application topically 2 (two) times daily. Until smooth. Avoid applying to face, groin, and axilla. Use as directed. Long-term use can cause thinning of the skin., Disp: 15 g, Rfl: 0   fluticasone  (FLONASE ) 50 MCG/ACT nasal spray, SPRAY 2 SPRAYS INTO EACH NOSTRIL EVERY DAY, Disp: 48 mL, Rfl: 1   hydrOXYzine  (ATARAX ) 10 MG tablet, TAKE 1 TABLET BY MOUTH DAILY AS NEEDED FOR ANXIETY., Disp: 90 tablet, Rfl: 1   ketoconazole  (NIZORAL ) 2 % shampoo, Apply 1 application topically See admin instructions. apply three times per week, massage into scalp and leave in for 10 minutes before rinsing out, Disp: 120 mL, Rfl: 11   naproxen  (NAPROSYN ) 500 MG tablet, Take 1 tablet (500 mg total) by mouth daily as needed for moderate pain., Disp: 30 tablet, Rfl: 0   omeprazole  (PRILOSEC) 40 MG capsule, Take 1 capsule (40 mg total) by mouth 2 (two) times daily., Disp: 90 capsule, Rfl: 3   venlafaxine XR (EFFEXOR XR) 37.5 MG 24 hr capsule, Take 1 capsule (37.5 mg total) by mouth daily with breakfast., Disp: 90 capsule, Rfl: 0   atorvastatin  (LIPITOR) 40 MG tablet, Take 1 tablet (40 mg total) by mouth daily., Disp: 90 tablet, Rfl: 1   Review of Systems  All other systems reviewed and are negative.    Objective  Vitals:   07/04/23 1513  BP: 120/74  Pulse: 100  Resp: 18  Temp: 98 F (36.7 C)  TempSrc: Oral  SpO2: 94%  Weight: 181 lb 1.6 oz (82.1 kg)  Height: 5' 8 (1.727 m)    Body mass index is 27.54 kg/m.  Physical Exam Constitutional:      Appearance: Normal appearance.  HENT:     Head: Normocephalic and atraumatic.     Right Ear:  Tympanic membrane, ear canal and external ear normal.     Left Ear: Ear canal and external ear normal.     Ears:     Comments: Fluid behind left TM    Mouth/Throat:     Mouth: Mucous membranes are moist.     Pharynx: Posterior oropharyngeal erythema present.   Eyes:     Extraocular Movements: Extraocular movements intact.     Conjunctiva/sclera: Conjunctivae normal.     Pupils: Pupils are equal, round, and reactive to light.   Neck:     Comments: No thyromegaly Cardiovascular:     Rate and Rhythm: Normal rate and regular rhythm.  Pulmonary:     Effort: Pulmonary effort is normal.     Breath sounds: Normal breath sounds.   Musculoskeletal:     Cervical back: No tenderness.     Right lower leg: No edema.     Left lower leg: No edema.  Lymphadenopathy:     Cervical: No cervical adenopathy.   Skin:    General: Skin is warm and dry.   Neurological:     General: No focal deficit present.     Mental Status: She is alert. Mental status is at baseline.   Psychiatric:        Mood and Affect: Mood normal.        Behavior: Behavior normal.     Last CBC Lab Results  Component Value Date   WBC 9.1 12/17/2022   HGB 14.8 12/17/2022   HCT 44.7 12/17/2022   MCV 94.1 12/17/2022   MCH 31.2 12/17/2022   RDW 12.2 12/17/2022   PLT 302 12/17/2022   Last metabolic panel Lab Results  Component Value Date   GLUCOSE 93 12/17/2022   NA 140 12/17/2022   K 4.6 12/17/2022   CL 102 12/17/2022   CO2 29 12/17/2022   BUN 14 12/17/2022   CREATININE 0.85 12/17/2022   EGFR 80 12/17/2022   CALCIUM  10.4 12/17/2022   PROT 6.9 12/17/2022   ALBUMIN 4.2 02/05/2022   BILITOT 0.5 12/17/2022   ALKPHOS 101 02/05/2022   AST 26 12/17/2022   ALT 24 12/17/2022   ANIONGAP 7 02/05/2022   Last lipids Lab Results  Component Value Date   CHOL 168 12/17/2022   HDL 59 12/17/2022   LDLCALC 86 12/17/2022   TRIG 134 12/17/2022   CHOLHDL 2.8 12/17/2022   Last hemoglobin A1c Lab Results  Component  Value Date   HGBA1C 5.6 12/17/2022   Last thyroid functions Lab Results  Component Value Date   TSH 2.50 04/17/2021   Last vitamin D  Lab Results  Component Value Date   VD25OH 35 12/17/2022   Last vitamin B12 and Folate No results found for: VITAMINB12, FOLATE    Assessment & Plan  Assessment & Plan  Menopausal symptoms Hot flashes, irritability, and sweating likely due to menopause. Venlafaxine discussed as a non-hormonal option for vasomotor symptoms. Potential side effects and withdrawal symptoms explained. - Discontinue Lexapro  (escitalopram ). - Initiate venlafaxine at the lowest dose once daily. - Schedule follow-up in 6-8 weeks to assess response. - Monitor for side effects and efficacy, particularly in managing hot flashes.  Anxiety Anxiety not improved with increased Lexapro  dose. Venlafaxine considered for its dual action on serotonin and norepinephrine, potentially benefiting anxiety and menopausal symptoms. - Discontinue Lexapro  (escitalopram ). - Initiate venlafaxine at the lowest dose once daily. - Schedule follow-up in 6-8 weeks to assess response. - Monitor for adverse effects or lack of efficacy.  Sinus drainage Sinus drainage noted, especially when lying down. No infection signs. Flonase  and nasal saline rinses discussed for symptom relief. - Use Flonase  two sprays in each nostril twice daily. - Consider nasal saline rinses.  Annual Physical/General Health Maintenance Vaccinations for shingles and pneumonia discussed but declined. STD screening not needed. Mammogram due. Lung cancer screening recommended due to smoking history. Recent labs normal. - Order mammogram. - Refer for lung cancer screening.  - atorvastatin  (LIPITOR) 40 MG tablet; Take 1 tablet (40 mg total) by mouth daily.  Dispense: 90 tablet; Refill: 1 - venlafaxine XR (EFFEXOR XR) 37.5 MG 24 hr capsule; Take 1 capsule (37.5 mg total) by mouth  daily with breakfast.  Dispense: 90 capsule;  Refill: 0 - MM 3D SCREENING MAMMOGRAM BILATERAL BREAST; Future - Ambulatory Referral Lung Cancer Screening [facility]   -USPSTF grade A and B recommendations reviewed with patient; age-appropriate recommendations, preventive care, screening tests, etc discussed and encouraged; healthy living encouraged; see AVS for patient education given to patient -Discussed importance of 150 minutes of physical activity weekly, eat two servings of fish weekly, eat one serving of tree nuts ( cashews, pistachios, pecans, almonds.SABRA) every other day, eat 6 servings of fruit/vegetables daily and drink plenty of water  and avoid sweet beverages.   -Reviewed Health Maintenance: Yes.

## 2023-07-20 ENCOUNTER — Telehealth: Payer: Self-pay

## 2023-07-20 DIAGNOSIS — Z87891 Personal history of nicotine dependence: Secondary | ICD-10-CM

## 2023-07-20 DIAGNOSIS — Z122 Encounter for screening for malignant neoplasm of respiratory organs: Secondary | ICD-10-CM

## 2023-07-20 DIAGNOSIS — F17210 Nicotine dependence, cigarettes, uncomplicated: Secondary | ICD-10-CM

## 2023-07-20 NOTE — Telephone Encounter (Signed)
.  Lung Cancer Screening Narrative/Criteria Questionnaire (Cigarette Smokers Only- No Cigars/Pipes/vapes)   Brianna Huff [place]   SDMV:07/26/2023 at 11:00 am with Natalie      [birth year]   LDCT: 07/27/2023 at 11:00 am at [facility]    58 y.o.   Phone: [phone]  Lung Screening Narrative (confirm age 58-77 yrs Medicare / 50-80 yrs Private pay insurance)   Insurance information: [employer]   Referring Provider:[facility]   This screening involves an initial phone call with a team member from our program. It is called a shared decision making visit. The initial meeting is required by  insurance and Medicare to make sure you understand the program. This appointment takes about 15-20 minutes to complete. You will complete the screening scan at your scheduled date/time.  This scan takes about 5-10 minutes to complete. You can eat and drink normally before and after the scan.  Criteria questions for Lung Cancer Screening:   Are you a current or former smoker? Current Age began smoking: 15   If you are a former smoker, what year did you quit smoking? (within 15 yrs)   To calculate your smoking history, I need an accurate estimate of how many packs of cigarettes you smoked per day and for how many years. (Not just the number of PPD you are now smoking)   Years smoking 43 x Packs per day 1 = Pack years 43   (at least 20 pack yrs)   (Make sure they understand that we need to know how much they have smoked in the past, not just the number of PPD they are smoking now)  Do you have a personal history of cancer?  No    Do you have a family history of cancer? Yes  (cancer type and and relative) Mother and maternal grandmother had breast cancer.   Are you coughing up blood?  No  Have you had unexplained weight loss of 15 lbs or more in the last 6 months? No  It looks like you meet all criteria.  When would be a good time for us  to schedule you for this screening?   Additional information: N/A

## 2023-07-26 ENCOUNTER — Ambulatory Visit: Admitting: Acute Care

## 2023-07-26 DIAGNOSIS — F17210 Nicotine dependence, cigarettes, uncomplicated: Secondary | ICD-10-CM

## 2023-07-26 NOTE — Progress Notes (Signed)
  Virtual Visit via Telephone Note  I connected with Brianna Huff on 07/26/23 at 10:00 AM EDT by telephone and verified that I am speaking with the correct person using two identifiers.  Location: Patient: Brianna Huff Provider: Laneta Speaks, RN   I discussed the limitations, risks, security and privacy concerns of performing an evaluation and management service by telephone and the availability of in person appointments. I also discussed with the patient that there may be a patient responsible charge related to this service. The patient expressed understanding and agreed to proceed.   Shared Decision Making Visit Lung Cancer Screening Program [phone])   Eligibility: Age 58 y.o. Pack Years Smoking History Calculation 43 (# packs/per year x # years smoked) Recent History of coughing up blood  no Unexplained weight loss? no ( >Than 15 pounds within the last 6 months ) Prior History Lung / other cancer no (Diagnosis within the last 5 years already requiring surveillance chest CT Scans). Smoking Status Current Smoker Former Smokers: Years since quit: n/a    Quit Date: n/a  Visit Components: Discussion included one or more decision making aids. yes Discussion included risk/benefits of screening. yes Discussion included potential follow up diagnostic testing for abnormal scans. yes Discussion included meaning and risk of over diagnosis. yes Discussion included meaning and risk of False Positives. yes Discussion included meaning of total radiation exposure. yes  Counseling Included: Importance of adherence to annual lung cancer LDCT screening. yes Impact of comorbidities on ability to participate in the program. yes Ability and willingness to under diagnostic treatment. yes  Smoking Cessation Counseling: Current Smokers:  Discussed importance of smoking cessation. yes Information about tobacco cessation classes and interventions provided to patient. yes Patient provided  with ticket for LDCT Scan. no Symptomatic Patient. no  Counseling(Intermediate counseling: > three minutes) 99406 Diagnosis Code: Tobacco Use Z72.0 Asymptomatic Patient yes  Counseling (Intermediate counseling: > three minutes counseling) [id number] Former Smokers:  Discussed the importance of maintaining cigarette abstinence. yes Diagnosis Code: Personal History of Nicotine  Dependence. S12.108 Information about tobacco cessation classes and interventions provided to patient. Yes Patient provided with ticket for LDCT Scan. no Written Order for Lung Cancer Screening with LDCT placed in Epic. Yes (CT Chest Lung Cancer Screening Low Dose W/O CM) [id number] Z12.2-Screening of respiratory organs Z87.891-Personal history of nicotine  dependence   Laneta Speaks, RN

## 2023-07-26 NOTE — Patient Instructions (Signed)

## 2023-07-27 ENCOUNTER — Ambulatory Visit
Admission: RE | Admit: 2023-07-27 | Discharge: 2023-07-27 | Disposition: A | Discharge status: Home / Self Care | Source: Ambulatory Visit | Attending: Acute Care | Admitting: Acute Care

## 2023-07-27 DIAGNOSIS — Z87891 Personal history of nicotine dependence: Secondary | ICD-10-CM

## 2023-07-27 DIAGNOSIS — Z122 Encounter for screening for malignant neoplasm of respiratory organs: Secondary | ICD-10-CM

## 2023-07-27 DIAGNOSIS — F17210 Nicotine dependence, cigarettes, uncomplicated: Secondary | ICD-10-CM

## 2023-07-29 ENCOUNTER — Other Ambulatory Visit: Payer: Self-pay

## 2023-07-29 DIAGNOSIS — Z122 Encounter for screening for malignant neoplasm of respiratory organs: Secondary | ICD-10-CM

## 2023-07-29 DIAGNOSIS — F17210 Nicotine dependence, cigarettes, uncomplicated: Secondary | ICD-10-CM

## 2023-07-29 DIAGNOSIS — Z87891 Personal history of nicotine dependence: Secondary | ICD-10-CM

## 2023-08-12 ENCOUNTER — Other Ambulatory Visit: Payer: Self-pay | Admitting: Internal Medicine

## 2023-08-12 DIAGNOSIS — J309 Allergic rhinitis, unspecified: Secondary | ICD-10-CM

## 2023-08-16 ENCOUNTER — Ambulatory Visit: Admitting: Internal Medicine

## 2023-08-16 ENCOUNTER — Encounter: Payer: Self-pay | Admitting: Internal Medicine

## 2023-08-16 ENCOUNTER — Other Ambulatory Visit: Payer: Self-pay

## 2023-08-16 VITALS — BP 120/70 | HR 100 | Temp 98.1°F | Resp 16 | Ht 68.0 in | Wt 182.0 lb

## 2023-08-16 DIAGNOSIS — J3489 Other specified disorders of nose and nasal sinuses: Secondary | ICD-10-CM | POA: Diagnosis not present

## 2023-08-16 DIAGNOSIS — F419 Anxiety disorder, unspecified: Secondary | ICD-10-CM

## 2023-08-16 DIAGNOSIS — J309 Allergic rhinitis, unspecified: Secondary | ICD-10-CM

## 2023-08-16 MED ORDER — METHYLPREDNISOLONE 4 MG PO TBPK: ORAL_TABLET | ORAL | 0 refills | Status: AC

## 2023-08-16 MED ORDER — MONTELUKAST SODIUM 10 MG PO TABS
10.0000 mg | ORAL_TABLET | Freq: Every day | ORAL | 3 refills | Status: AC
Start: 2023-08-16 — End: ?

## 2023-08-16 NOTE — Progress Notes (Signed)
 Established Patient Office Visit  Subjective   Patient ID: Brianna Huff, female    DOB: [birth year]  Age: 58 y.o. MRN: [id number]  Chief Complaint  Patient presents with   Medical Management of Chronic Issues    HPI  Patient is here for follow up on medication changes. At our last office visit, her Lexapro  was weaned off and we had started Effexor  37.5 mg to help with hot flashes.   Discussed the use of AI scribe software for clinical note transcription with the patient, who gave verbal consent to proceed.  History of Present Illness Brianna Huff is a 58 year old female who presents with anxiety and hot flashes.  She has been experiencing anxiety and hot flashes. Effexor  was previously prescribed but discontinued due to side effects and lack of benefit. Lexapro  initially provided relief but became ineffective even at higher doses.  She experiences ear pressure, headaches, and occasional dizziness, especially when turning her head. She uses Zyrtec  and two allergy nasal sprays. There have been no recent ear infections, but she reports sinus drainage and pressure.  She has gastric issues, including gastritis, and takes omeprazole  twice daily. She previously tried pantoprazole  but had insurance coverage issues. Omeprazole  is not always effective.   GERD: -Having issues with increased gas, burping and burning epigastric pain -No nausea or vomiting but it does affect sleep sometimes -Currently on Omeprazole  40 mg twice a day and zantac in the evening but still having symptoms  -Weight stable  HLD: -Medications: Lipitor 40 mg -Patient is compliant with above medications and reports no side effects.  -Last lipid panel: Lipid Panel     Component Value Date/Time   CHOL 168 12/17/2022 1128   TRIG 134 12/17/2022 1128   HDL 59 12/17/2022 1128   CHOLHDL 2.8 12/17/2022 1128   VLDL 55 (H) 12/16/2021 0402   LDLCALC 86 12/17/2022 1128    Pre-Diabetes: -A1c 12/23 5.7%, repeat 12/24  5.6% -Not on medications currently   Hx of kidney stones:  -History of stones twice, most recent in January, had to go to the ER. Multiple stones on CT, largest 3 mm, passed on their own. Tries to avoid tea and dark colas, tries to stay well hydrated.  Seasonal Allergies: -Currently on Flonase , Zyrtec  and Astelin  nasal spray -Complaining of watery, itching eyes, sneezing   Health Maintenance: -Blood work UTD -Mammogram 6/24 Birads-1, previously ordered, will call to schedule -Colon cancer screening: colnoscopy 8/23, repeat in 7 years -Complete Paps, had hysterctomy in early 2000's -Lung cancer screening: CT 7/25 Lung-RADS 1  Patient Active Problem List   Diagnosis Date Noted   Mixed hyperlipidemia 12/17/2022   Tobacco use 04/27/2022   NSTEMI (non-ST elevated myocardial infarction) (HCC) 12/14/2021   Encounter for smoking cessation counseling 12/14/2021   Alcohol use 12/14/2021   Alcohol use 12/14/2021   Encounter for smoking cessation counseling 12/14/2021   NSTEMI (non-ST elevated myocardial infarction) (HCC) 12/14/2021   Screening for colon cancer    Polyp of sigmoid colon    Gastroesophageal reflux disease 04/17/2021   History of kidney stones 04/17/2021   Hot flashes 04/17/2021   Seasonal allergies 04/17/2021   History of kidney stones 04/17/2021   RAD (reactive airway disease), moderate persistent, uncomplicated 07/30/2016   Dyspepsia 01/30/2016   Prediabetes 01/30/2016   Polyp of colon 01/30/2016   Chronic allergic rhinitis 07/11/2015   Spondylosis of lumbar region without myelopathy or radiculopathy 07/11/2015   Varicose veins of both lower extremities with pain 07/11/2015  Left ureteral stone 11/16/2014   Gross hematuria 11/05/2014   Hematuria 10/27/2014   Frank hematuria 10/25/2014   Acute vaginitis 10/01/2014   Acute back pain with sciatica 09/25/2014   Hemorrhoids, internal 09/25/2014   Anxiety 09/25/2014   Candida vaginitis 09/25/2014   Cigarette smoker  02/20/2014   Past Medical History:  Diagnosis Date   Anxiety and depression    Cyst of left kidney    GERD (gastroesophageal reflux disease)    Gross hematuria    Internal hemorrhoids    Kidney stones    Low back pain    Lower abdominal pain    Monilial vaginitis    Nasal congestion    PONV (postoperative nausea and vomiting)    Shortness of breath dyspnea    Vaginitis    Past Surgical History:  Procedure Laterality Date   ABDOMINAL HYSTERECTOMY     still has ovaries    COLONOSCOPY N/A 08/21/2021   Procedure: COLONOSCOPY;  Surgeon: Jinny Carmine, MD;  Location: [facility];  Service: Endoscopy;  Laterality: N/A;   CYSTOSCOPY W/ RETROGRADES Bilateral 11/26/2014   Procedure: CYSTOSCOPY WITH RETROGRADE PYELOGRAM;  Surgeon: Rosina Riis, MD;  Location: [facility];  Service: Urology;  Laterality: Bilateral;   CYSTOSCOPY WITH STENT PLACEMENT Left 11/13/2014   Procedure: CYSTOSCOPY, left retrograde pyelogram WITH left ureteral STENT PLACEMENT;  Surgeon: Belvie LITTIE Clara, MD;  Location: [facility];  Service: Urology;  Laterality: Left;   CYSTOSCOPY/URETEROSCOPY/HOLMIUM LASER/STENT PLACEMENT Left 11/26/2014   Procedure: CYSTOSCOPY/URETEROSCOPY/HOLMIUM LASER/STENT PLACEMENT;  Surgeon: Rosina Riis, MD;  Location: [facility];  Service: Urology;  Laterality: Left;   ESOPHAGOGASTRODUODENOSCOPY N/A 08/21/2021   Procedure: ESOPHAGOGASTRODUODENOSCOPY (EGD);  Surgeon: Jinny Carmine, MD;  Location: [facility];  Service: Endoscopy;  Laterality: N/A;   LEFT HEART CATH AND CORONARY ANGIOGRAPHY N/A 12/16/2021   Procedure: LEFT HEART CATH AND CORONARY ANGIOGRAPHY and possible PCI;  Surgeon: Florencio Cara BIRCH, MD;  Location: [facility];  Service: Cardiovascular;  Laterality: N/A;   NASAL SINUS SURGERY     POLYPECTOMY  08/21/2021   Procedure: POLYPECTOMY;  Surgeon: Jinny Carmine, MD;  Location: [facility];  Service: Endoscopy;;   TUBAL LIGATION     Social History    Tobacco Use   Smoking status: Every Day    Current packs/day: 1.00    Average packs/day: 1 pack/day for 42.6 years (42.6 ttl pk-yrs)    Types: Cigarettes    Start date: 31   Smokeless tobacco: Never  Vaping Use   Vaping status: Never Used  Substance Use Topics   Alcohol use: Yes    Alcohol/week: 4.0 standard drinks of alcohol    Types: 4 Standard drinks or equivalent per week   Drug use: No   Social History   Socioeconomic History   Marital status: Single    Spouse name: Not on file   Number of children: Not on file   Years of education: Not on file   Highest education level: 12th grade  Occupational History   Not on file  Tobacco Use   Smoking status: Every Day    Current packs/day: 1.00    Average packs/day: 1 pack/day for 42.6 years (42.6 ttl pk-yrs)    Types: Cigarettes    Start date: 79   Smokeless tobacco: Never  Vaping Use   Vaping status: Never Used  Substance and Sexual Activity   Alcohol use: Yes    Alcohol/week: 4.0 standard drinks of alcohol    Types: 4 Standard drinks or  equivalent per week   Drug use: No   Sexual activity: Not Currently  Other Topics Concern   Not on file  Social History Narrative   Not on file   Social Drivers of Health   Financial Resource Strain: Low Risk  (07/04/2023)   Overall Financial Resource Strain (CARDIA)    Difficulty of Paying Living Expenses: Not hard at all  Food Insecurity: No Food Insecurity (07/04/2023)   Hunger Vital Sign    Worried About Running Out of Food in the Last Year: Never true    Ran Out of Food in the Last Year: Never true  Transportation Needs: No Transportation Needs (07/04/2023)   PRAPARE - Administrator, Civil Service (Medical): No    Lack of Transportation (Non-Medical): No  Physical Activity: Sufficiently Active (07/04/2023)   Exercise Vital Sign    Days of Exercise per Week: 5 days    Minutes of Exercise per Session: 30 min  Stress: Stress Concern Present (07/04/2023)    [employer] of Occupational Health - Occupational Stress Questionnaire    Feeling of Stress: To some extent  Social Connections: Socially Isolated (07/04/2023)   Social Connection and Isolation Panel    Frequency of Communication with Friends and Family: More than three times a week    Frequency of Social Gatherings with Friends and Family: More than three times a week    Attends Religious Services: Never    Database administrator or Organizations: No    Attends Banker Meetings: Never    Marital Status: Divorced  Catering manager Violence: Not At Risk (07/04/2023)   Humiliation, Afraid, Rape, and Kick questionnaire    Fear of Current or Ex-Partner: No    Emotionally Abused: No    Physically Abused: No    Sexually Abused: No   Family Status  Relation Name Status   Mother  Deceased   Father  Alive   Daughter 1 Alive   Neg Hx  (Not Specified)  No partnership data on file   Family History  Problem Relation Age of Onset   Hyperlipidemia Mother    Hypertension Mother    Breast cancer Mother    Asthma Father    Prostate cancer Neg Hx    Kidney cancer Neg Hx    Bladder Cancer Neg Hx      Review of Systems  HENT:  Positive for ear pain and sore throat.   Gastrointestinal:  Positive for heartburn.  All other systems reviewed and are negative.     Objective:     BP 120/70 (Cuff Size: Large)   Pulse 100   Temp 98.1 F (36.7 C) (Oral)   Resp 16   Ht 5' 8 (1.727 m)   Wt 182 lb (82.6 kg)   SpO2 98%   BMI 27.67 kg/m  BP Readings from Last 3 Encounters:  08/16/23 120/70  07/04/23 120/74  01/13/23 130/70   Wt Readings from Last 3 Encounters:  08/16/23 182 lb (82.6 kg)  07/04/23 181 lb 1.6 oz (82.1 kg)  01/13/23 181 lb 14.4 oz (82.5 kg)      Physical Exam Constitutional:      Appearance: Normal appearance.  HENT:     Head: Normocephalic and atraumatic.     Right Ear: Tympanic membrane, ear canal and external ear normal.     Left Ear: Ear  canal and external ear normal.     Ears:     Comments: Fluid behind left TM  Mouth/Throat:     Mouth: Mucous membranes are moist.     Comments: PND Eyes:     Conjunctiva/sclera: Conjunctivae normal.  Cardiovascular:     Rate and Rhythm: Normal rate and regular rhythm.  Pulmonary:     Effort: Pulmonary effort is normal.     Breath sounds: Normal breath sounds.  Skin:    General: Skin is warm and dry.  Neurological:     General: No focal deficit present.     Mental Status: She is alert. Mental status is at baseline.  Psychiatric:        Mood and Affect: Mood normal.        Behavior: Behavior normal.      No results found for any visits on 08/16/23.  Last CBC Lab Results  Component Value Date   WBC 9.1 12/17/2022   HGB 14.8 12/17/2022   HCT 44.7 12/17/2022   MCV 94.1 12/17/2022   MCH 31.2 12/17/2022   RDW 12.2 12/17/2022   PLT 302 12/17/2022   Last metabolic panel Lab Results  Component Value Date   GLUCOSE 93 12/17/2022   NA 140 12/17/2022   K 4.6 12/17/2022   CL 102 12/17/2022   CO2 29 12/17/2022   BUN 14 12/17/2022   CREATININE 0.85 12/17/2022   GFRNONAA >60 02/05/2022   CALCIUM  10.4 12/17/2022   PROT 6.9 12/17/2022   ALBUMIN 4.2 02/05/2022   BILITOT 0.5 12/17/2022   ALKPHOS 101 02/05/2022   AST 26 12/17/2022   ALT 24 12/17/2022   ANIONGAP 7 02/05/2022   Last lipids Lab Results  Component Value Date   CHOL 168 12/17/2022   HDL 59 12/17/2022   LDLCALC 86 12/17/2022   TRIG 134 12/17/2022   CHOLHDL 2.8 12/17/2022   Last hemoglobin A1c Lab Results  Component Value Date   HGBA1C 5.6 12/17/2022   Last thyroid functions Lab Results  Component Value Date   TSH 2.50 04/17/2021   Last vitamin D  Lab Results  Component Value Date   VD25OH 35 12/17/2022   Last vitamin B12 and Folate No results found for: VITAMINB12, FOLATE    The ASCVD Risk score (Arnett DK, et al., 2019) failed to calculate for the following reasons:   Risk score cannot  be calculated because patient has a medical history suggesting prior/existing ASCVD    Assessment & Plan:   Assessment & Plan Anxiety Discontinued Effexor  due to side effects. Lexapro  was ineffective at lower doses. Discussed Genesight testing for optimal medication selection. - Perform Genesight genetic testing for medication response. - Consider restarting Lexapro  at 10 mg with potential increase to 20 mg based on genetic testing results.  Allergic rhinitis with eustachian tube dysfunction Symptoms of ear pressure, headaches, and dizziness possibly due to allergies. Fluid in ear without infection. Discussed Singulair  addition and allergist referral if symptoms persist. Medrol  Dosepak prescribed to reduce inflammation and improve drainage. - Continue Zyrtec  and nasal sprays. - Prescribe Medrol  Dosepak for six days to reduce inflammation and pressure. - Consider referral to an allergist if symptoms persist.  Gastritis with gastric wall thickening Gastric wall thickening likely due to gastritis, possibly from acid reflux. Omeprazole  not fully effective. Protonix  preferred but limited by insurance. - Continue omeprazole  as prescribed. - Consider alternative PPI if insurance coverage allows.   - methylPREDNISolone  (MEDROL  DOSEPAK) 4 MG TBPK tablet; Use as directed.  Dispense: 21 each; Refill: 0 - montelukast  (SINGULAIR ) 10 MG tablet; Take 1 tablet (10 mg total) by mouth at bedtime.  Dispense:  30 tablet; Refill: 3   Return in about 4 months (around 12/16/2023).    Sharyle Fischer, DO

## 2023-08-16 NOTE — Telephone Encounter (Signed)
 Requested Prescriptions  Pending Prescriptions Disp Refills   fluticasone  (FLONASE ) 50 MCG/ACT nasal spray [Pharmacy Med Name: FLUTICASONE  PROP 50 MCG SPRAY] 48 mL 0    Sig: SPRAY 2 SPRAYS INTO EACH NOSTRIL EVERY DAY     Ear, Nose, and Throat: Nasal Preparations - Corticosteroids Passed - 08/16/2023  8:22 AM      Passed - Valid encounter within last 12 months    Recent Outpatient Visits           1 month ago Annual physical exam   [facility] Bernardo Fend, DO       Future Appointments             Today Bernardo Fend, DO [facility]   In 3 months [name], MD [facility]

## 2023-09-12 ENCOUNTER — Encounter: Payer: Self-pay | Admitting: Internal Medicine

## 2023-09-16 NOTE — Progress Notes (Signed)
  Virtual Visit via Telephone Note  I connected with Brianna Huff on 09/16/23 at 10:00 AM EDT by telephone and verified that I am speaking with the correct person using two identifiers.  Location: Patient: home Provider: [address] [place], [place], [address]    I discussed the limitations, risks, security and privacy concerns of performing an evaluation and management service by telephone and the availability of in person appointments. I also discussed with the patient that there may be a patient responsible charge related to this service. The patient expressed understanding and agreed to proceed.   Shared Decision Making Visit Lung Cancer Screening Program [phone])   Eligibility: Age 58 y.o. Pack Years Smoking History Calculation 43 (# packs/per year x # years smoked) Recent History of coughing up blood  no Unexplained weight loss? no ( >Than 15 pounds within the last 6 months ) Prior History Lung / other cancer no (Diagnosis within the last 5 years already requiring surveillance chest CT Scans). Smoking Status Current Smoker Former Smokers: Years since quit: n/a    Quit Date: n/a  Visit Components: Discussion included one or more decision making aids. yes Discussion included risk/benefits of screening. yes Discussion included potential follow up diagnostic testing for abnormal scans. yes Discussion included meaning and risk of over diagnosis. yes Discussion included meaning and risk of False Positives. yes Discussion included meaning of total radiation exposure. yes  Counseling Included: Importance of adherence to annual lung cancer LDCT screening. yes Impact of comorbidities on ability to participate in the program. yes Ability and willingness to under diagnostic treatment. yes  Smoking Cessation Counseling: Current Smokers:  Discussed importance of smoking cessation. yes Information about tobacco cessation classes and interventions provided to patient.  yes Patient provided with ticket for LDCT Scan. no Symptomatic Patient. no  Counseling(Intermediate counseling: > three minutes) 99406 Diagnosis Code: Tobacco Use Z72.0 Asymptomatic Patient yes   Counseled patient 4 minutes regarding tobacco use.   Former Smokers:  Discussed the importance of maintaining cigarette abstinence. yes Diagnosis Code: Personal History of Nicotine  Dependence. S12.108 Information about tobacco cessation classes and interventions provided to patient. Yes Patient provided with ticket for LDCT Scan. no Written Order for Lung Cancer Screening with LDCT placed in Epic. Yes (CT Chest Lung Cancer Screening Low Dose W/O CM) [id number] Z12.2-Screening of respiratory organs Z87.891-Personal history of nicotine  dependence   Brianna Speaks, RN

## 2023-09-26 ENCOUNTER — Other Ambulatory Visit: Payer: Self-pay | Admitting: Internal Medicine

## 2023-09-26 DIAGNOSIS — K219 Gastro-esophageal reflux disease without esophagitis: Secondary | ICD-10-CM

## 2023-09-26 DIAGNOSIS — J3489 Other specified disorders of nose and nasal sinuses: Secondary | ICD-10-CM

## 2023-09-26 DIAGNOSIS — J309 Allergic rhinitis, unspecified: Secondary | ICD-10-CM

## 2023-09-27 ENCOUNTER — Other Ambulatory Visit: Payer: Self-pay | Admitting: Internal Medicine

## 2023-09-27 ENCOUNTER — Telehealth: Payer: Self-pay

## 2023-09-27 DIAGNOSIS — J309 Allergic rhinitis, unspecified: Secondary | ICD-10-CM

## 2023-09-27 DIAGNOSIS — J3489 Other specified disorders of nose and nasal sinuses: Secondary | ICD-10-CM

## 2023-09-27 DIAGNOSIS — K219 Gastro-esophageal reflux disease without esophagitis: Secondary | ICD-10-CM

## 2023-09-27 MED ORDER — OMEPRAZOLE 40 MG PO CPDR: 40.0000 mg | DELAYED_RELEASE_CAPSULE | Freq: Two times a day (BID) | ORAL | 3 refills | Status: DC

## 2023-09-27 NOTE — Telephone Encounter (Signed)
 Requested medications are due for refill today.  unsure  Requested medications are on the active medications list.  yes  Last refill. 12/17/2022 #90 3 rf  Future visit scheduled.   no  Notes to clinic.  Sig for request differs from med list. Is pt to take 1 or 2 daily?    Requested Prescriptions  Pending Prescriptions Disp Refills   omeprazole  (PRILOSEC) 40 MG capsule [Pharmacy Med Name: OMEPRAZOLE  DR 40 MG CAPSULE] 90 capsule 3    Sig: TAKE 1 CAPSULE (40 MG TOTAL) BY MOUTH DAILY.     Gastroenterology: Proton Pump Inhibitors Passed - 09/27/2023  3:22 PM      Passed - Valid encounter within last 12 months    Recent Outpatient Visits           1 month ago Anxiety   [facility] Bern[facility] Fend, DO   2 months ago Annual physical exam   [facility]ardo [facility]end, DO       Future Appointments             In 1 month [name], MD [facility]            Refused Prescriptions Disp Refills   montelukast  (SINGULAIR ) 10 MG tablet [Pharmacy Med Name: MONTELUKAST  SOD 10 MG TABLET] 90 tablet 1    Sig: TAKE 1 TABLET BY MOUTH EVERYDAY AT BEDTIME     Pulmonology:  Leukotriene Inhibitors Passed - 09/27/2023  3:22 PM      Passed - Valid encounter within last 12 months    Recent Outpatient Visits           1 month ago Anxiety   [facility] Bernardo Fend, DO   2 months ago Annual physical exam   [facility] Bernardo Fend, DO       Future Appointments             In 1 month [name], MD [facility]

## 2023-09-27 NOTE — Telephone Encounter (Signed)
 refill

## 2023-09-27 NOTE — Telephone Encounter (Signed)
 Requested Prescriptions  Pending Prescriptions Disp Refills   omeprazole  (PRILOSEC) 40 MG capsule [Pharmacy Med Name: OMEPRAZOLE  DR 40 MG CAPSULE] 90 capsule 3    Sig: TAKE 1 CAPSULE (40 MG TOTAL) BY MOUTH DAILY.     Gastroenterology: Proton Pump Inhibitors Passed - 09/27/2023  3:21 PM      Passed - Valid encounter within last 12 months    Recent Outpatient Visits           1 month ago Anxiety   [facility] Bernardo Fend, DO   2 months ago Annual physical exam   [facility] Bernardo Fend, DO       Future Appointments             In 1 month [name], MD [facility]            Refused Prescriptions Disp Refills   montelukast  (SINGULAIR ) 10 MG tablet [Pharmacy Med Name: MONTELUKAST  SOD 10 MG TABLET] 90 tablet 1    Sig: TAKE 1 TABLET BY MOUTH EVERYDAY AT BEDTIME     Pulmonology:  Leukotriene Inhibitors Passed - 09/27/2023  3:21 PM      Passed - Valid encounter within last 12 months    Recent Outpatient Visits           1 month ago Anxiety   [facility] Bernardo Fend, DO   2 months ago Annual physical exam   [facility] Bernardo Fend, DO       Future Appointments             In 1 month [name], MD [facility]

## 2023-09-28 NOTE — Telephone Encounter (Signed)
 Requested medication (s) are due for refill today: yes  Requested medication (s) are on the active medication list: yes  Last refill:  09/27/23  Future visit scheduled: no  Notes to clinic:  Pharmacy comment: Script Clarification:PLAN LIMITATION WILL ONLYCOVER 1 PER DAY.      Requested Prescriptions  Pending Prescriptions Disp Refills   omeprazole  (PRILOSEC) 40 MG capsule [Pharmacy Med Name: OMEPRAZOLE  DR 40 MG CAPSULE] 90 capsule 3    Sig: TAKE 1 CAPSULE BY MOUTH TWICE A DAY     Gastroenterology: Proton Pump Inhibitors Passed - 09/28/2023  4:28 PM      Passed - Valid encounter within last 12 months    Recent Outpatient Visits           1 month ago Anxiety   [facility] Bernardo Fend, DO   2 months ago Annual physical exam   [facility] Bernardo Fend, DO       Future Appointments             In 1 month [name], MD [facility]

## 2023-09-28 NOTE — Telephone Encounter (Signed)
 Too soon for refill, LRF 08/16/23 FOR 30 AND 3 RF.  Requested Prescriptions  Pending Prescriptions Disp Refills   montelukast  (SINGULAIR ) 10 MG tablet [Pharmacy Med Name: MONTELUKAST  SOD 10 MG TABLET] 90 tablet 1    Sig: TAKE 1 TABLET BY MOUTH EVERYDAY AT BEDTIME     Pulmonology:  Leukotriene Inhibitors Passed - 09/28/2023  3:58 PM      Passed - Valid encounter within last 12 months    Recent Outpatient Visits           1 month ago Anxiety   [facility] Bernardo Fend, DO   2 months ago Annual physical exam   [facility] Bernardo Fend, DO       Future Appointments             In 1 month [name], MD [facility]

## 2023-09-30 ENCOUNTER — Other Ambulatory Visit: Payer: Self-pay | Admitting: Internal Medicine

## 2023-09-30 DIAGNOSIS — F419 Anxiety disorder, unspecified: Secondary | ICD-10-CM

## 2023-09-30 DIAGNOSIS — R232 Flushing: Secondary | ICD-10-CM

## 2023-09-30 NOTE — Telephone Encounter (Signed)
 Unable to refill per protocol, discontinued 08/16/23, side effect.  Requested Prescriptions  Pending Prescriptions Disp Refills   venlafaxine  XR (EFFEXOR -XR) 37.5 MG 24 hr capsule [Pharmacy Med Name: VENLAFAXINE  HCL ER 37.5 MG CAP] 90 capsule 0    Sig: TAKE 1 CAPSULE BY MOUTH DAILY WITH BREAKFAST.     Psychiatry: Antidepressants - SNRI - desvenlafaxine & venlafaxine  Failed - 09/30/2023  4:05 PM      Failed - Lipid Panel in normal range within the last 12 months    Cholesterol  Date Value Ref Range Status  12/17/2022 168 <200 mg/dL Final   LDL Cholesterol (Calc)  Date Value Ref Range Status  12/17/2022 86 mg/dL (calc) Final    Comment:    Reference range: <100 . Desirable range <100 mg/dL for primary prevention;   <70 mg/dL for patients with CHD or diabetic patients  with > or = 2 CHD risk factors. SABRA LDL-C is now calculated using the Martin-Hopkins  calculation, which is a validated novel method providing  better accuracy than the Friedewald equation in the  estimation of LDL-C.  Gladis APPLETHWAITE et al. SANDREA. 7986;689(80): 2061-2068  (http://education.[employer].com/faq/FAQ164)    HDL  Date Value Ref Range Status  12/17/2022 59 > OR = 50 mg/dL Final   Triglycerides  Date Value Ref Range Status  12/17/2022 134 <150 mg/dL Final         Passed - Cr in normal range and within 360 days    Creat  Date Value Ref Range Status  12/17/2022 0.85 0.50 - 1.03 mg/dL Final         Passed - Last BP in normal range    BP Readings from Last 1 Encounters:  08/16/23 120/70         Passed - Valid encounter within last 6 months    Recent Outpatient Visits           1 month ago Anxiety   [facility] Bernardo Fend, DO   2 months ago Annual physical exam   [facility] Bernardo Fend, DO       Future Appointments             In 1 month [name], MD [facility]

## 2023-11-10 ENCOUNTER — Other Ambulatory Visit: Payer: Self-pay | Admitting: Internal Medicine

## 2023-11-10 DIAGNOSIS — J309 Allergic rhinitis, unspecified: Secondary | ICD-10-CM

## 2023-11-11 NOTE — Telephone Encounter (Signed)
 Requested Prescriptions  Pending Prescriptions Disp Refills   fluticasone  (FLONASE ) 50 MCG/ACT nasal spray [Pharmacy Med Name: FLUTICASONE  PROP 50 MCG SPRAY] 48 mL 1    Sig: SPRAY 2 SPRAYS INTO EACH NOSTRIL EVERY DAY     Ear, Nose, and Throat: Nasal Preparations - Corticosteroids Passed - 11/11/2023 12:39 PM      Passed - Valid encounter within last 12 months    Recent Outpatient Visits           2 months ago Anxiety   [facility] Bernardo Fend, DO   4 months ago Annual physical exam   [facility] Bernardo Fend, DO       Future Appointments             In 4 days [name], MD [facility]

## 2023-11-15 ENCOUNTER — Ambulatory Visit: Payer: Managed Care, Other (non HMO) | Admitting: Dermatology

## 2023-11-15 ENCOUNTER — Encounter: Payer: Self-pay | Admitting: Dermatology

## 2023-11-15 DIAGNOSIS — D485 Neoplasm of uncertain behavior of skin: Secondary | ICD-10-CM

## 2023-11-15 DIAGNOSIS — L578 Other skin changes due to chronic exposure to nonionizing radiation: Secondary | ICD-10-CM | POA: Diagnosis not present

## 2023-11-15 DIAGNOSIS — Z1283 Encounter for screening for malignant neoplasm of skin: Secondary | ICD-10-CM | POA: Diagnosis not present

## 2023-11-15 DIAGNOSIS — W908XXA Exposure to other nonionizing radiation, initial encounter: Secondary | ICD-10-CM

## 2023-11-15 DIAGNOSIS — L989 Disorder of the skin and subcutaneous tissue, unspecified: Secondary | ICD-10-CM

## 2023-11-15 DIAGNOSIS — L821 Other seborrheic keratosis: Secondary | ICD-10-CM

## 2023-11-15 DIAGNOSIS — B079 Viral wart, unspecified: Secondary | ICD-10-CM | POA: Diagnosis not present

## 2023-11-15 DIAGNOSIS — L219 Seborrheic dermatitis, unspecified: Secondary | ICD-10-CM

## 2023-11-15 DIAGNOSIS — D229 Melanocytic nevi, unspecified: Secondary | ICD-10-CM

## 2023-11-15 DIAGNOSIS — L814 Other melanin hyperpigmentation: Secondary | ICD-10-CM

## 2023-11-15 DIAGNOSIS — D1801 Hemangioma of skin and subcutaneous tissue: Secondary | ICD-10-CM

## 2023-11-15 MED ORDER — TRIAMCINOLONE ACETONIDE 0.1 % EX LOTN: TOPICAL_LOTION | CUTANEOUS | 5 refills | Status: AC

## 2023-11-15 NOTE — Progress Notes (Signed)
 Follow-Up Visit   Subjective  Brianna Huff is a 58 y.o. female who presents for the following: Skin Cancer Screening and Full Body Skin Exam  The patient presents for Total-Body Skin Exam (TBSE) for skin cancer screening and mole check. The patient has spots, moles and lesions to be evaluated, some may be new or changing and the patient may have concern these could be cancer.  No hx skin cancer. Patient with seb derm at ears, uses TMC 0.1% lotion.   Patient has a rough spot at left cheek, right chest and back of each calf.   The following portions of the chart were reviewed this encounter and updated as appropriate: medications, allergies, medical history  Review of Systems:  No other skin or systemic complaints except as noted in HPI or Assessment and Plan.  Objective  Well appearing patient in no apparent distress; mood and affect are within normal limits.  A full examination was performed including scalp, head, eyes, ears, nose, lips, neck, chest, axillae, abdomen, back, buttocks, bilateral upper extremities, bilateral lower extremities, hands, feet, fingers, toes, fingernails, and toenails. All findings within normal limits unless otherwise noted below.   Relevant physical exam findings are noted in the Assessment and Plan.  right lower mid lateral leg 4 mm pink papule  right superior forehead 6 mm pink macule  Assessment & Plan   SKIN CANCER SCREENING PERFORMED TODAY.  ACTINIC DAMAGE - Chronic condition, secondary to cumulative UV/sun exposure - diffuse scaly erythematous macules with underlying dyspigmentation - Recommend daily broad spectrum sunscreen SPF 30+ to sun-exposed areas, reapply every 2 hours as needed.  - Staying in the shade or wearing long sleeves, sun glasses (UVA+UVB protection) and wide brim hats (4-inch brim around the entire circumference of the hat) are also recommended for sun protection.  - Call for new or changing lesions.  LENTIGINES,  SEBORRHEIC KERATOSES, HEMANGIOMAS - Benign normal skin lesions - Benign-appearing - Call for any changes  MELANOCYTIC NEVI - Tan-brown and/or pink-flesh-colored symmetric macules and papules - Benign appearing on exam today - Observation - Call clinic for new or changing moles - Recommend daily use of broad spectrum spf 30+ sunscreen to sun-exposed areas.   SEBORRHEIC DERMATITIS Exam: clear   Chronic and persistent condition with duration or expected duration over one year. Condition is symptomatic/ bothersome to patient. Not currently at goal.  Seborrheic Dermatitis is a chronic persistent rash characterized by pinkness and scaling most commonly of the mid face but also can occur on the scalp (dandruff), ears; mid chest, mid back and groin.  It tends to be exacerbated by stress and cooler weather.  People who have neurologic disease may experience new onset or exacerbation of existing seborrheic dermatitis.  The condition is not curable but treatable and can be controlled.  Treatment Plan: Continue TMC 0.1% lotion twice daily for up to 2 weeks as needed for rash at ears, scalp. Avoid applying to face, groin, and axilla. Use as directed. Long-term use can cause thinning of the skin. Patient doesn't use keto shampoo  Topical steroids (such as triamcinolone , fluocinolone, fluocinonide, mometasone, clobetasol , halobetasol, betamethasone, hydrocortisone) can cause thinning and lightening of the skin if they are used for too long in the same area. Your physician has selected the right strength medicine for your problem and area affected on the body. Please use your medication only as directed by your physician to prevent side effects.    NEOPLASM OF UNCERTAIN BEHAVIOR OF SKIN (2) right lower mid  lateral leg Skin / nail biopsy Type of biopsy: tangential   Informed consent: discussed and consent obtained   Timeout: patient name, date of birth, surgical site, and procedure verified   Procedure  prep:  Patient was prepped and draped in usual sterile fashion Prep type:  Isopropyl alcohol Anesthesia: the lesion was anesthetized in a standard fashion   Anesthetic:  1% lidocaine  w/ epinephrine 1-100,000 buffered w/ 8.4% NaHCO3 Instrument used: DermaBlade   Hemostasis achieved with: pressure and aluminum chloride   Outcome: patient tolerated procedure well   Post-procedure details: sterile dressing applied and wound care instructions given   Dressing type: bandage and petrolatum    right superior forehead Will monitor right superior forehead for change and biopsy if indicated.  Related Procedures Anatomic Pathology Report MULTIPLE BENIGN NEVI   LENTIGINES   ACTINIC ELASTOSIS   SEBORRHEIC KERATOSES   CHERRY ANGIOMA   SEBORRHEIC DERMATITIS   Related Medications ketoconazole  (NIZORAL ) 2 % shampoo Apply 1 application topically See admin instructions. apply three times per week, massage into scalp and leave in for 10 minutes before rinsing out Return in about 6 months (around 05/14/2024) for with Dr. Claudene recheck spot at right forehead, 1 year TBSE.  LILLETTE Lonell Drones, RMA, am acting as scribe for Boneta Claudene, MD .   Documentation: I have reviewed the above documentation for accuracy and completeness, and I agree with the above.  Boneta Claudene, MD

## 2023-11-15 NOTE — Patient Instructions (Signed)
 Wound Care Instructions  Cleanse wound gently with soap and water once a day then pat dry with clean gauze. Apply a thin coat of Petrolatum (petroleum jelly, Vaseline) over the wound (unless you have an allergy to this). We recommend that you use a new, sterile tube of Vaseline. Do not pick or remove scabs. Do not remove the yellow or white healing tissue from the base of the wound.  Cover the wound with fresh, clean, nonstick gauze and secure with paper tape. You may use Band-Aids in place of gauze and tape if the wound is small enough, but would recommend trimming much of the tape off as there is often too much. Sometimes Band-Aids can irritate the skin.  You should call the office for your biopsy report after 1 week if you have not already been contacted.  If you experience any problems, such as abnormal amounts of bleeding, swelling, significant bruising, significant pain, or evidence of infection, please call the office immediately.  FOR ADULT SURGERY PATIENTS: If you need something for pain relief you may take 1 extra strength Tylenol  (acetaminophen ) AND 2 Ibuprofen (200mg  each) together every 4 hours as needed for pain. (do not take these if you are allergic to them or if you have a reason you should not take them.) Typically, you may only need pain medication for 1 to 3 days.     Melanoma ABCDEs  Melanoma is the most dangerous type of skin cancer, and is the leading cause of death from skin disease.  You are more likely to develop melanoma if you: Have light-colored skin, light-colored eyes, or red or blond hair Spend a lot of time in the sun Tan regularly, either outdoors or in a tanning bed Have had blistering sunburns, especially during childhood Have a close family member who has had a melanoma Have atypical moles or large birthmarks  Early detection of melanoma is key since treatment is typically straightforward and cure rates are extremely high if we catch it early.   The  first sign of melanoma is often a change in a mole or a new dark spot.  The ABCDE system is a way of remembering the signs of melanoma.  A for asymmetry:  The two halves do not match. B for border:  The edges of the growth are irregular. C for color:  A mixture of colors are present instead of an even brown color. D for diameter:  Melanomas are usually (but not always) greater than 6mm - the size of a pencil eraser. E for evolution:  The spot keeps changing in size, shape, and color.  Please check your skin once per month between visits. You can use a small mirror in front and a large mirror behind you to keep an eye on the back side or your body.   If you see any new or changing lesions before your next follow-up, please call to schedule a visit.  Please continue daily skin protection including broad spectrum sunscreen SPF 30+ to sun-exposed areas, reapplying every 2 hours as needed when you're outdoors.    Due to recent changes in healthcare laws, you may see results of your pathology and/or laboratory studies on MyChart before the doctors have had a chance to review them. We understand that in some cases there may be results that are confusing or concerning to you. Please understand that not all results are received at the same time and often the doctors may need to interpret multiple results in order to  provide you with the best plan of care or course of treatment. Therefore, we ask that you please give us  2 business days to thoroughly review all your results before contacting the office for clarification. Should we see a critical lab result, you will be contacted sooner.   If You Need Anything After Your Visit  If you have any questions or concerns for your doctor, please call our main line at [phone] and press option 4 to reach your doctor's medical assistant. If no one answers, please leave a voicemail as directed and we will return your call as soon as possible. Messages left after 4  pm will be answered the following business day.   You may also send us  a message via MyChart. We typically respond to MyChart messages within 1-2 business days.  For prescription refills, please ask your pharmacy to contact our office. Our fax number is [phone].  If you have an urgent issue when the clinic is closed that cannot wait until the next business day, you can page your doctor at the number below.    Please note that while we do our best to be available for urgent issues outside of office hours, we are not available 24/7.   If you have an urgent issue and are unable to reach us , you may choose to seek medical care at your doctor's office, retail clinic, urgent care center, or emergency room.  If you have a medical emergency, please immediately call 911 or go to the emergency department.  Pager Numbers  - Dr. Hester: [phone]  - Dr. Jackquline: [phone]  - Dr. Claudene: [phone]   - Dr. Raymund: [phone]  In the event of inclement weather, please call our main line at [phone] for an update on the status of any delays or closures.  Dermatology Medication Tips: Please keep the boxes that topical medications come in in order to help keep track of the instructions about where and how to use these. Pharmacies typically print the medication instructions only on the boxes and not directly on the medication tubes.   If your medication is too expensive, please contact our office at [phone] option 4 or send us  a message through MyChart.   We are unable to tell what your co-pay for medications will be in advance as this is different depending on your insurance coverage. However, we may be able to find a substitute medication at lower cost or fill out paperwork to get insurance to cover a needed medication.   If a prior authorization is required to get your medication covered by your insurance company, please allow us  1-2 business days to complete this  process.  Drug prices often vary depending on where the prescription is filled and some pharmacies may offer cheaper prices.  The website www.goodrx.com contains coupons for medications through different pharmacies. The prices here do not account for what the cost may be with help from insurance (it may be cheaper with your insurance), but the website can give you the price if you did not use any insurance.  - You can print the associated coupon and take it with your prescription to the pharmacy.  - You may also stop by our office during regular business hours and pick up a GoodRx coupon card.  - If you need your prescription sent electronically to a different pharmacy, notify our office through [facility] or by phone at [phone] option 4.     Si Usted Necesita Algo Despus de Su Visita  Tambin puede enviarnos un mensaje a travs de MyChart. Por lo general respondemos a los mensajes de MyChart en el transcurso de 1 a 2 das hbiles.  Para renovar recetas, por favor pida a su farmacia que se ponga en contacto con nuestra oficina. Randi lakes de fax es [place] [phone].  Si tiene un asunto urgente cuando la clnica est cerrada y que no puede esperar hasta el siguiente da hbil, puede llamar/localizar a su doctor(a) al nmero que aparece a continuacin.   Por favor, tenga en cuenta que aunque hacemos todo lo posible para estar disponibles para asuntos urgentes fuera del horario de [place], no estamos disponibles las 24 horas del da, los 7 [address]  [address] de la [place].   Si tiene un problema urgente y no puede comunicarse con nosotros, puede optar por buscar atencin mdica  en el consultorio de su doctor(a), en una clnica privada, en un centro de atencin urgente o en una sala de emergencias.  Si tiene Engineer, drilling, por favor llame inmediatamente al 911 o vaya a la sala de emergencias.  Nmeros de bper  - Dr. Hester: [phone]  - Dra. Jackquline: [phone]  - Dr.  Claudene: [phone]  - Dra. Kitts: [phone]  En caso de inclemencias del [place], por favor llame a nuestra lnea principal al [phone] para una actualizacin sobre el estado de cualquier retraso o cierre.  Consejos para la medicacin en dermatologa: Por favor, guarde las cajas en las que vienen los medicamentos de uso tpico para ayudarle a seguir las instrucciones sobre dnde y cmo usarlos. Las farmacias generalmente imprimen las instrucciones del medicamento slo en las cajas y no directamente en los tubos del [place].   Si su medicamento es muy caro, por favor, pngase en contacto con landry rieger llamando al [phone] y presione la opcin 4 o envenos un mensaje a travs de Clinical cytogeneticist.   No podemos decirle cul ser su copago por los medicamentos por adelantado ya que esto es diferente dependiendo de la cobertura de su seguro. Sin embargo, es posible que podamos encontrar un medicamento sustituto a Audiological scientist un formulario para que el seguro cubra el medicamento que se considera necesario.   Si se requiere una autorizacin previa para que su compaa de seguros malta su medicamento, por favor permtanos de 1 a 2 das hbiles para completar este proceso.  Los precios de los medicamentos varan con frecuencia dependiendo del Environmental consultant de dnde se surte la receta y alguna farmacias pueden ofrecer precios ms baratos.  El sitio web www.goodrx.com tiene cupones para medicamentos de Health and safety inspector. Los precios aqu no tienen en cuenta lo que podra costar con la ayuda del seguro (puede ser ms barato con su seguro), pero el sitio web puede darle el precio si no utiliz Tourist information centre manager.  - Puede imprimir el cupn correspondiente y llevarlo con su receta a la farmacia.  - Tambin puede pasar por nuestra oficina durante el horario de atencin regular y Education officer, museum una tarjeta de cupones de GoodRx.  - Si necesita que su receta se enve electrnicamente a una farmacia diferente,  informe a nuestra oficina a travs de MyChart de [place] o por telfono llamando al [phone] y presione la opcin 4.

## 2023-11-24 ENCOUNTER — Telehealth: Payer: Self-pay

## 2023-11-24 NOTE — Telephone Encounter (Signed)
 Called and left VM

## 2023-11-24 NOTE — Telephone Encounter (Signed)
 [facility] called regarding recent pathology. It was sent out for consultation and this might delay results by 1 week.

## 2023-11-24 NOTE — Telephone Encounter (Signed)
 Patient returned call about bx results. Patient was also notified by Alan through mychart about the delay in results due to having to be sent out for consultation.  Patient had not reviewed her mychart message so read the message Alan had sent her through [place]. Patient advised will call once we get results and would be in the next week or so.

## 2023-12-03 ENCOUNTER — Ambulatory Visit: Payer: Self-pay | Admitting: Dermatology

## 2023-12-03 DIAGNOSIS — D485 Neoplasm of uncertain behavior of skin: Secondary | ICD-10-CM

## 2023-12-03 LAB — ANATOMIC PATHOLOGY REPORT

## 2023-12-05 NOTE — Telephone Encounter (Signed)
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  SEE COMMENTS.   Plan: please call to share that biopsy shows a wart with atypical features. Excision is recommended to be safe. Please refer to Dr Corey for standard excision (NOT MOHS) ----- Message ----- From: Rebecka Memos Lab Results In Sent: 12/03/2023   7:35 AM EST To: Boneta Sharps, MD

## 2023-12-07 NOTE — Telephone Encounter (Signed)
 Patient advised of BX results and referral sent to Dr. Corey. aw

## 2023-12-07 NOTE — Addendum Note (Signed)
 Addended by: TERESA PALMA R on: 12/07/2023 05:06 PM   Modules accepted: Orders

## 2024-01-11 ENCOUNTER — Other Ambulatory Visit: Payer: Self-pay | Admitting: Internal Medicine

## 2024-01-11 DIAGNOSIS — J309 Allergic rhinitis, unspecified: Secondary | ICD-10-CM

## 2024-01-12 NOTE — Telephone Encounter (Signed)
 Requested medication (s) are due for refill today - expired Rx  Requested medication (s) are on the active medication list -yes  Future visit scheduled -no  Last refill: 12/17/22 #30 11RF  Notes to clinic: fails lab protocol- over 1 year- 12/17/22  Requested Prescriptions  Pending Prescriptions Disp Refills   cetirizine  (ZYRTEC ) 10 MG tablet [Pharmacy Med Name: CETIRIZINE  HCL 10 MG TABLET] 90 tablet 3    Sig: TAKE 1 TABLET BY MOUTH EVERY DAY     Ear, Nose, and Throat:  Antihistamines 2 Failed - 01/12/2024 11:57 AM      Failed - Cr in normal range and within 360 days    Creat  Date Value Ref Range Status  12/17/2022 0.85 0.50 - 1.03 mg/dL Final         Passed - Valid encounter within last 12 months    Recent Outpatient Visits           4 months ago Anxiety   [facility] Bernardo Fend, DO   6 months ago Annual physical exam   [facility] Bernardo Fend, DO       Future Appointments             In 4 months [name], MD [facility] [place]   In 10 months [name], MD [facility]               Requested Prescriptions  Pending Prescriptions Disp Refills   cetirizine  (ZYRTEC ) 10 MG tablet [Pharmacy Med Name: CETIRIZINE  HCL 10 MG TABLET] 90 tablet 3    Sig: TAKE 1 TABLET BY MOUTH EVERY DAY     Ear, Nose, and Throat:  Antihistamines 2 Failed - 01/12/2024 11:57 AM      Failed - Cr in normal range and within 360 days    Creat  Date Value Ref Range Status  12/17/2022 0.85 0.50 - 1.03 mg/dL Final         Passed - Valid encounter within last 12 months    Recent Outpatient Visits           4 months ago Anxiety   [facility] Bernardo Fend, DO   6 months ago Annual physical exam   [facility]end[facility] DO       Future Appointments             In 4 months [name], MD  [facility]   In 10 months [name], MD [facility] [place] [facility]

## 2024-05-14 ENCOUNTER — Ambulatory Visit: Admitting: Dermatology

## 2024-11-20 ENCOUNTER — Ambulatory Visit: Admitting: Dermatology
